# Patient Record
Sex: Female | Born: 1970
Health system: Southern US, Community
[De-identification: ages and names within clinical notes are randomized; demographics above are authoritative.]

## PROBLEM LIST (undated history)

## (undated) DIAGNOSIS — M109 Gout, unspecified: Secondary | ICD-10-CM

## (undated) DIAGNOSIS — G43909 Migraine, unspecified, not intractable, without status migrainosus: Secondary | ICD-10-CM

## (undated) DIAGNOSIS — I10 Essential (primary) hypertension: Secondary | ICD-10-CM

---

## 1998-03-21 ENCOUNTER — Emergency Department (HOSPITAL_COMMUNITY): Admission: EM | Admit: 1998-03-21 | Discharge: 1998-03-21 | Payer: Self-pay | Admitting: Emergency Medicine

## 1998-07-28 ENCOUNTER — Emergency Department (HOSPITAL_COMMUNITY): Admission: EM | Admit: 1998-07-28 | Discharge: 1998-07-28 | Payer: Self-pay | Admitting: Emergency Medicine

## 1998-07-28 ENCOUNTER — Encounter: Payer: Self-pay | Admitting: Emergency Medicine

## 1998-09-16 ENCOUNTER — Emergency Department (HOSPITAL_COMMUNITY): Admission: EM | Admit: 1998-09-16 | Discharge: 1998-09-16 | Payer: Self-pay | Admitting: Emergency Medicine

## 1999-02-09 ENCOUNTER — Emergency Department (HOSPITAL_COMMUNITY): Admission: EM | Admit: 1999-02-09 | Discharge: 1999-02-10 | Payer: Self-pay | Admitting: *Deleted

## 2000-11-14 ENCOUNTER — Encounter: Payer: Self-pay | Admitting: Emergency Medicine

## 2000-11-14 ENCOUNTER — Emergency Department (HOSPITAL_COMMUNITY): Admission: EM | Admit: 2000-11-14 | Discharge: 2000-11-15 | Payer: Self-pay | Admitting: Emergency Medicine

## 2001-04-16 ENCOUNTER — Emergency Department (HOSPITAL_COMMUNITY): Admission: EM | Admit: 2001-04-16 | Discharge: 2001-04-16 | Payer: Self-pay | Admitting: Emergency Medicine

## 2001-05-01 ENCOUNTER — Emergency Department (HOSPITAL_COMMUNITY): Admission: EM | Admit: 2001-05-01 | Discharge: 2001-05-01 | Payer: Self-pay | Admitting: Emergency Medicine

## 2001-12-05 ENCOUNTER — Emergency Department (HOSPITAL_COMMUNITY): Admission: EM | Admit: 2001-12-05 | Discharge: 2001-12-05 | Payer: Self-pay | Admitting: Emergency Medicine

## 2001-12-05 ENCOUNTER — Encounter: Payer: Self-pay | Admitting: Emergency Medicine

## 2001-12-19 ENCOUNTER — Encounter: Admission: RE | Admit: 2001-12-19 | Discharge: 2001-12-19 | Payer: Self-pay | Admitting: Obstetrics and Gynecology

## 2002-01-21 ENCOUNTER — Encounter (INDEPENDENT_AMBULATORY_CARE_PROVIDER_SITE_OTHER): Payer: Self-pay

## 2002-01-21 ENCOUNTER — Ambulatory Visit (HOSPITAL_COMMUNITY): Admission: RE | Admit: 2002-01-21 | Discharge: 2002-01-21 | Payer: Self-pay | Admitting: Obstetrics and Gynecology

## 2002-04-11 ENCOUNTER — Emergency Department (HOSPITAL_COMMUNITY): Admission: EM | Admit: 2002-04-11 | Discharge: 2002-04-11 | Payer: Self-pay | Admitting: Emergency Medicine

## 2002-04-11 ENCOUNTER — Encounter: Payer: Self-pay | Admitting: Emergency Medicine

## 2002-07-04 ENCOUNTER — Encounter: Admission: RE | Admit: 2002-07-04 | Discharge: 2002-07-04 | Payer: Self-pay | Admitting: *Deleted

## 2002-07-22 ENCOUNTER — Encounter: Admission: RE | Admit: 2002-07-22 | Discharge: 2002-07-22 | Payer: Self-pay | Admitting: *Deleted

## 2002-09-18 ENCOUNTER — Encounter: Admission: RE | Admit: 2002-09-18 | Discharge: 2002-09-18 | Payer: Self-pay | Admitting: Obstetrics and Gynecology

## 2002-10-01 ENCOUNTER — Emergency Department (HOSPITAL_COMMUNITY): Admission: EM | Admit: 2002-10-01 | Discharge: 2002-10-02 | Payer: Self-pay | Admitting: Emergency Medicine

## 2003-02-21 ENCOUNTER — Emergency Department (HOSPITAL_COMMUNITY): Admission: EM | Admit: 2003-02-21 | Discharge: 2003-02-21 | Payer: Self-pay

## 2003-05-05 ENCOUNTER — Emergency Department (HOSPITAL_COMMUNITY): Admission: EM | Admit: 2003-05-05 | Discharge: 2003-05-05 | Payer: Self-pay | Admitting: Emergency Medicine

## 2003-11-05 ENCOUNTER — Emergency Department (HOSPITAL_COMMUNITY): Admission: EM | Admit: 2003-11-05 | Discharge: 2003-11-06 | Payer: Self-pay | Admitting: Emergency Medicine

## 2003-12-05 ENCOUNTER — Inpatient Hospital Stay (HOSPITAL_COMMUNITY): Admission: AD | Admit: 2003-12-05 | Discharge: 2003-12-05 | Payer: Self-pay | Admitting: Obstetrics and Gynecology

## 2004-01-26 ENCOUNTER — Emergency Department (HOSPITAL_COMMUNITY): Admission: EM | Admit: 2004-01-26 | Discharge: 2004-01-26 | Payer: Self-pay | Admitting: Emergency Medicine

## 2004-05-26 ENCOUNTER — Emergency Department (HOSPITAL_COMMUNITY): Admission: EM | Admit: 2004-05-26 | Discharge: 2004-05-27 | Payer: Self-pay | Admitting: Emergency Medicine

## 2004-09-30 ENCOUNTER — Emergency Department (HOSPITAL_COMMUNITY): Admission: EM | Admit: 2004-09-30 | Discharge: 2004-09-30 | Payer: Self-pay | Admitting: Emergency Medicine

## 2005-03-22 ENCOUNTER — Emergency Department (HOSPITAL_COMMUNITY): Admission: EM | Admit: 2005-03-22 | Discharge: 2005-03-23 | Payer: Self-pay | Admitting: Emergency Medicine

## 2005-05-08 ENCOUNTER — Emergency Department (HOSPITAL_COMMUNITY): Admission: EM | Admit: 2005-05-08 | Discharge: 2005-05-08 | Payer: Self-pay | Admitting: *Deleted

## 2005-11-06 ENCOUNTER — Emergency Department (HOSPITAL_COMMUNITY): Admission: EM | Admit: 2005-11-06 | Discharge: 2005-11-07 | Payer: Self-pay | Admitting: Emergency Medicine

## 2005-11-23 ENCOUNTER — Emergency Department (HOSPITAL_COMMUNITY): Admission: EM | Admit: 2005-11-23 | Discharge: 2005-11-23 | Payer: Self-pay | Admitting: Emergency Medicine

## 2006-02-18 ENCOUNTER — Emergency Department (HOSPITAL_COMMUNITY): Admission: EM | Admit: 2006-02-18 | Discharge: 2006-02-18 | Payer: Self-pay | Admitting: Emergency Medicine

## 2006-02-21 ENCOUNTER — Emergency Department (HOSPITAL_COMMUNITY): Admission: EM | Admit: 2006-02-21 | Discharge: 2006-02-21 | Payer: Self-pay | Admitting: Family Medicine

## 2006-03-27 ENCOUNTER — Emergency Department (HOSPITAL_COMMUNITY): Admission: EM | Admit: 2006-03-27 | Discharge: 2006-03-27 | Payer: Self-pay | Admitting: Emergency Medicine

## 2006-04-12 ENCOUNTER — Ambulatory Visit: Payer: Self-pay | Admitting: Obstetrics and Gynecology

## 2006-05-29 HISTORY — PX: ABDOMINAL HYSTERECTOMY: SHX81

## 2006-07-02 ENCOUNTER — Ambulatory Visit: Payer: Self-pay | Admitting: Obstetrics and Gynecology

## 2006-07-02 ENCOUNTER — Encounter (INDEPENDENT_AMBULATORY_CARE_PROVIDER_SITE_OTHER): Payer: Self-pay | Admitting: *Deleted

## 2006-07-02 ENCOUNTER — Inpatient Hospital Stay (HOSPITAL_COMMUNITY): Admission: RE | Admit: 2006-07-02 | Discharge: 2006-07-04 | Payer: Self-pay | Admitting: Cardiology

## 2006-07-25 ENCOUNTER — Ambulatory Visit: Payer: Self-pay | Admitting: Obstetrics & Gynecology

## 2006-08-09 ENCOUNTER — Emergency Department (HOSPITAL_COMMUNITY): Admission: EM | Admit: 2006-08-09 | Discharge: 2006-08-09 | Payer: Self-pay | Admitting: Emergency Medicine

## 2006-09-19 ENCOUNTER — Emergency Department (HOSPITAL_COMMUNITY): Admission: EM | Admit: 2006-09-19 | Discharge: 2006-09-19 | Payer: Self-pay | Admitting: Emergency Medicine

## 2006-10-23 ENCOUNTER — Emergency Department (HOSPITAL_COMMUNITY): Admission: EM | Admit: 2006-10-23 | Discharge: 2006-10-23 | Payer: Self-pay | Admitting: Emergency Medicine

## 2007-03-20 ENCOUNTER — Ambulatory Visit (HOSPITAL_COMMUNITY): Admission: RE | Admit: 2007-03-20 | Discharge: 2007-03-20 | Payer: Self-pay | Admitting: Family Medicine

## 2007-08-16 ENCOUNTER — Emergency Department (HOSPITAL_COMMUNITY): Admission: EM | Admit: 2007-08-16 | Discharge: 2007-08-16 | Payer: Self-pay | Admitting: Emergency Medicine

## 2008-02-09 ENCOUNTER — Emergency Department (HOSPITAL_COMMUNITY): Admission: EM | Admit: 2008-02-09 | Discharge: 2008-02-09 | Payer: Self-pay | Admitting: Emergency Medicine

## 2008-04-11 ENCOUNTER — Emergency Department (HOSPITAL_COMMUNITY): Admission: EM | Admit: 2008-04-11 | Discharge: 2008-04-12 | Payer: Self-pay | Admitting: Emergency Medicine

## 2008-10-21 ENCOUNTER — Emergency Department (HOSPITAL_COMMUNITY): Admission: EM | Admit: 2008-10-21 | Discharge: 2008-10-21 | Payer: Self-pay | Admitting: Emergency Medicine

## 2009-03-23 ENCOUNTER — Emergency Department (HOSPITAL_COMMUNITY): Admission: EM | Admit: 2009-03-23 | Discharge: 2009-03-24 | Payer: Self-pay | Admitting: Emergency Medicine

## 2009-07-03 ENCOUNTER — Emergency Department (HOSPITAL_COMMUNITY): Admission: EM | Admit: 2009-07-03 | Discharge: 2009-07-03 | Payer: Self-pay | Admitting: Emergency Medicine

## 2009-09-26 ENCOUNTER — Emergency Department (HOSPITAL_COMMUNITY): Admission: EM | Admit: 2009-09-26 | Discharge: 2009-09-26 | Payer: Self-pay | Admitting: Emergency Medicine

## 2009-12-12 ENCOUNTER — Emergency Department (HOSPITAL_COMMUNITY): Admission: EM | Admit: 2009-12-12 | Discharge: 2009-12-13 | Payer: Self-pay | Admitting: Emergency Medicine

## 2010-03-08 ENCOUNTER — Emergency Department (HOSPITAL_COMMUNITY)
Admission: EM | Admit: 2010-03-08 | Discharge: 2010-03-08 | Payer: Self-pay | Source: Home / Self Care | Admitting: Emergency Medicine

## 2010-04-02 ENCOUNTER — Emergency Department (HOSPITAL_COMMUNITY): Admission: EM | Admit: 2010-04-02 | Discharge: 2010-04-02 | Payer: Self-pay | Admitting: Emergency Medicine

## 2010-06-30 ENCOUNTER — Emergency Department (HOSPITAL_COMMUNITY): Payer: Medicaid Other

## 2010-06-30 ENCOUNTER — Emergency Department (HOSPITAL_COMMUNITY)
Admission: EM | Admit: 2010-06-30 | Discharge: 2010-06-30 | Disposition: A | Discharge status: Home / Self Care | Payer: Medicaid Other | Attending: Emergency Medicine | Admitting: Emergency Medicine

## 2010-06-30 DIAGNOSIS — J029 Acute pharyngitis, unspecified: Secondary | ICD-10-CM | POA: Insufficient documentation

## 2010-06-30 DIAGNOSIS — R197 Diarrhea, unspecified: Secondary | ICD-10-CM | POA: Insufficient documentation

## 2010-06-30 DIAGNOSIS — J3489 Other specified disorders of nose and nasal sinuses: Secondary | ICD-10-CM | POA: Insufficient documentation

## 2010-06-30 DIAGNOSIS — R059 Cough, unspecified: Secondary | ICD-10-CM | POA: Insufficient documentation

## 2010-06-30 DIAGNOSIS — J069 Acute upper respiratory infection, unspecified: Secondary | ICD-10-CM | POA: Insufficient documentation

## 2010-06-30 DIAGNOSIS — R05 Cough: Secondary | ICD-10-CM | POA: Insufficient documentation

## 2010-06-30 LAB — POCT I-STAT, CHEM 8
BUN: 8 mg/dL (ref 6–23)
Calcium, Ion: 1.1 mmol/L — ABNORMAL LOW (ref 1.12–1.32)
Chloride: 108 mEq/L (ref 96–112)
Creatinine, Ser: 1 mg/dL (ref 0.4–1.2)
Glucose, Bld: 93 mg/dL (ref 70–99)
HCT: 42 % (ref 36.0–46.0)
Hemoglobin: 14.3 g/dL (ref 12.0–15.0)
Potassium: 4.3 mEq/L (ref 3.5–5.1)
Sodium: 137 mEq/L (ref 135–145)
TCO2: 23 mmol/L (ref 0–100)

## 2010-06-30 LAB — RAPID STREP SCREEN (MED CTR MEBANE ONLY): Streptococcus, Group A Screen (Direct): NEGATIVE

## 2010-08-01 ENCOUNTER — Emergency Department (HOSPITAL_COMMUNITY)
Admission: EM | Admit: 2010-08-01 | Discharge: 2010-08-02 | Disposition: A | Discharge status: Home / Self Care | Payer: Medicaid Other | Attending: Emergency Medicine | Admitting: Emergency Medicine

## 2010-08-01 DIAGNOSIS — G43909 Migraine, unspecified, not intractable, without status migrainosus: Secondary | ICD-10-CM | POA: Insufficient documentation

## 2010-08-01 DIAGNOSIS — H53149 Visual discomfort, unspecified: Secondary | ICD-10-CM | POA: Insufficient documentation

## 2010-08-01 DIAGNOSIS — Z862 Personal history of diseases of the blood and blood-forming organs and certain disorders involving the immune mechanism: Secondary | ICD-10-CM | POA: Insufficient documentation

## 2010-08-01 DIAGNOSIS — R112 Nausea with vomiting, unspecified: Secondary | ICD-10-CM | POA: Insufficient documentation

## 2010-08-01 DIAGNOSIS — Z8639 Personal history of other endocrine, nutritional and metabolic disease: Secondary | ICD-10-CM | POA: Insufficient documentation

## 2010-08-17 LAB — DIFFERENTIAL
Basophils Absolute: 0 10*3/uL (ref 0.0–0.1)
Basophils Relative: 0 % (ref 0–1)
Eosinophils Absolute: 0.1 10*3/uL (ref 0.0–0.7)
Eosinophils Relative: 1 % (ref 0–5)
Lymphocytes Relative: 7 % — ABNORMAL LOW (ref 12–46)
Lymphs Abs: 0.8 10*3/uL (ref 0.7–4.0)
Monocytes Absolute: 0.2 10*3/uL (ref 0.1–1.0)
Monocytes Relative: 2 % — ABNORMAL LOW (ref 3–12)
Neutrophils Relative %: 90 % — ABNORMAL HIGH (ref 43–77)

## 2010-08-17 LAB — URINALYSIS, ROUTINE W REFLEX MICROSCOPIC
Bilirubin Urine: NEGATIVE
Glucose, UA: NEGATIVE mg/dL
Hgb urine dipstick: NEGATIVE
Nitrite: NEGATIVE
Protein, ur: NEGATIVE mg/dL
Urobilinogen, UA: 0.2 mg/dL (ref 0.0–1.0)
pH: 5.5 (ref 5.0–8.0)

## 2010-08-17 LAB — COMPREHENSIVE METABOLIC PANEL
CO2: 22 mEq/L (ref 19–32)
Creatinine, Ser: 0.67 mg/dL (ref 0.4–1.2)
GFR calc Af Amer: >60 mL/min (ref >60)
Glucose, Bld: 87 mg/dL (ref 70–99)
Sodium: 135 mEq/L (ref 135–145)

## 2010-08-17 LAB — URINE MICROSCOPIC-ADD ON

## 2010-08-17 LAB — CBC
HCT: 39.6 % (ref 36.0–46.0)
MCHC: 33.7 g/dL (ref 30.0–36.0)
WBC: 10.9 10*3/uL — ABNORMAL HIGH (ref 4.0–10.5)

## 2010-08-17 LAB — LIPASE, BLOOD: Lipase: 23 U/L (ref 11–59)

## 2010-09-01 LAB — CBC
Hemoglobin: 12.7 g/dL (ref 12.0–15.0)
MCHC: 34.1 g/dL (ref 30.0–36.0)
Platelets: 331 10*3/uL (ref 150–400)
RBC: 4.26 MIL/uL (ref 3.87–5.11)
RDW: 12.8 % (ref 11.5–15.5)

## 2010-09-01 LAB — BASIC METABOLIC PANEL
BUN: 9 mg/dL (ref 6–23)
Chloride: 108 mEq/L (ref 96–112)
Creatinine, Ser: 0.66 mg/dL (ref 0.4–1.2)
GFR calc Af Amer: >60 mL/min (ref >60)
Glucose, Bld: 84 mg/dL (ref 70–99)

## 2010-09-01 LAB — URINALYSIS, ROUTINE W REFLEX MICROSCOPIC
Nitrite: NEGATIVE
Specific Gravity, Urine: 1.007 (ref 1.005–1.030)
Urobilinogen, UA: 0.2 mg/dL (ref 0.0–1.0)
pH: 6 (ref 5.0–8.0)

## 2010-09-01 LAB — URINE MICROSCOPIC-ADD ON

## 2010-09-01 LAB — DIFFERENTIAL
Basophils Absolute: 0 10*3/uL (ref 0.0–0.1)
Basophils Relative: 0 % (ref 0–1)
Lymphocytes Relative: 27 % (ref 12–46)
Monocytes Absolute: 0.9 10*3/uL (ref 0.1–1.0)
Neutro Abs: 6.6 10*3/uL (ref 1.7–7.7)
Neutrophils Relative %: 64 % (ref 43–77)

## 2010-10-14 NOTE — Discharge Summary (Signed)
NAMESANVI, EHLER           ACCOUNT NO.:  [id number]   MEDICAL RECORD NO.:  [id number]          PATIENT TYPE:  INP   LOCATION:  9316                          [facility]   PHYSICIAN:  Phil D. Okey Dupre, M.D.     DATE OF BIRTH:  [birth year]   DATE OF ADMISSION:  07/02/2006  DATE OF DISCHARGE:  07/04/2006                               DISCHARGE SUMMARY   The patient is a 40 year old multiparous African-American female who  underwent total vaginal hysterectomy because of severe dysmenorrhea and  menorrhagia on the day of admission and has done extremely well  postoperatively.  She was kept for an extra day because on admission her  reported hematocrit was 39.8 with a hemoglobin of 13.5.  On the day  after surgery, i.e., yesterday, she had a hemoglobin of 8.1 with a  hematocrit of 23.4.  Physical examination did not reveal any sign of  hematoma or any other reason for that tremendous drop when the patient,  we thought, lost 50 mL or less of blood at the time of surgery, so this  was repeated yesterday afternoon with a hematocrit of 22.5 and a  hemoglobin of 7.6.  Repeated hemoglobin this morning with a hemoglobin  of 8.1 and a hematocrit of 22.8.  therefore, I think that we have to  conclude that the admitting blood tests were a laboratory error as we  are positive that the estimated blood loss at surgery was not an  underestimate.  Other than that, on the physical exam, the lungs are  clear.  Heart:  No murmur, normal sinus rhythm.  The abdomen is soft,  flat, slightly tender as you would expect post hysterectomy, without  rebound, without guarding, and with normal bowel sounds.  The patient is  voiding, passing gas.  The extremities are negative with no Homans sign  and no edema.   The discharge instructions have been given in detail to the patient,  especially those related to heavy lifting and stairs as well as driving.  We have discussed follow-up with her and we will see her in  2 or 3 weeks  in the GYN clinic.  She has been given prescriptions for Slow Fe, which  I want her to stay on once a day for 3 months.  She has received a  prescription for Motrin 600 mg to take around the clock, and Percocet  p.r.n. for pain.  The number of those was Percocet 5 mg, #40; Motrin 600  mg, #30.   The discharge diagnosis per pathology has not yet been reported so at  this time is dysmenorrhea and menorrhagia.  The white count has been  normal, as have her platelets.  No other lab tests were done during her  postoperative period.           ______________________________  Javier Glazier Okey Dupre, M.D.     PDR/MEDQ  D:  07/04/2006  T:  07/04/2006  Job:  [id number]

## 2010-10-14 NOTE — Consult Note (Signed)
[facility]  Patient:    [name], [name]                    MRN: [id number] Proc. Date: 11/15/00 Adm. Date:  [id number] Attending:  Sandi Raveling CC:         Family Planning, [facility] Dept, [place], [place]   Consultation Report  CHIEF COMPLAINT:  Right lower quadrant pain, twice in the last 24 hours.  HISTORY OF PRESENT ILLNESS:  [age]-year-old woman, G4, P4-0-0-5, last menstrual period October 28, 2000, status post tubal ligation in 1997, who presented at 5:30 p.m. to the [facility] Emergency Room on November 14, 2000 for evaluation of right lower quadrant pain.  The patient noted right lower quadrant pain which began suddenly at 10 p.m. on the evening of November 13, 2000.  At that time, she was sitting on the floor eating her dinner.  The pain lasted less than two hours and went away after Advil.  She was able to sleep through the night.  Pain was better with hip flexion and did not radiate, was not accompanied by any GI symptoms.  The pain recurred in the same fashion while she was cleaning the refrigerator at 1 p.m. on November 14, 2000.  This time, the pain was "like labor," crampy and lasted 3 to 4 seconds.  The discomfort was ungulating and recurred every 5 to 10 minutes.  She presented to the hospital approximately 1730.  Last bowel movement was on the morning of November 14, 2000 and was normal.  She has had no nausea or vomiting.  She has eaten normally.  She was given medication at the time she was admitted to the emergency room and she has felt no further pain in the seven hours that she has been in the ER.  Her last period was normal and menses occur monthly, lasting four days and are moderately heavy during the first few days.  She reports her last Pap smear was five years ago and she has one partner.  She has had no urinary symptoms or back pain.  She reports no history of any sexually transmitted diseases in the past.  PAST  MEDICAL HISTORY:  Medical:  None.  Surgical:  Tubal ligation, 1997.  ALLERGIES:  None.  MEDICATIONS:  None.  OBSTETRICAL HISTORY:  Status post four vaginal deliveries, the last of which was in 1997.  FAMILY HISTORY:  Mother with hypertension.  No breast, ovarian or colon cancer.  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  Smokes a half a pack of cigarettes a day and drinks alcohol occasionally, whenever she has money. She reports sometimes drinking a six-pack at a time but usually has no more than six beers per week.  REVIEW OF SYSTEMS:  Otherwise normal -- see HPI.  PHYSICAL EXAMINATION:  GENERAL:  Agitated, healthy-appearing woman who is complaining about the wait and states that she feels well and is ready to go home.  Afebrile.  VITAL SIGNS:  Stable.  ABDOMEN:  Soft.  Normoactive bowel sounds.  Well-healed subumbilical incision. No hepatosplenomegaly, mass or hernia.  BACK:  Without CVAT.  GU:  External genitalia, BUS within normal limits.  Vagina with moderate creamy beige discharge.  Cervix without lesion or friability.  No cervical motion tenderness.  Uterus anteverted, top-normal size, nontender, mobile. Adnexa normal to palpation, nontender bilaterally.  Rectovaginal exam confirmatory.  Stool Hemoccult negative.  LABORATORY DATA:  Urine pregnancy negative.  Hemoglobin 12.9, WBC 10.2.  Normal comprehensive metabolic panel with the exception of a low alkaline phosphatase at 34 (normal 39 to 117).  Normal lipase.  Urinalysis:  Trace leukocyte esterase, no microscopic performed.  Wet prep:  No yeast, a few Trichomonas, no clue, many wbcs seen.  CT scan of the abdomen suggested a 2 x 3 adnexal area mass.  Ultrasound was obtained and revealed slight asymmetry of the right ovary which contains a few follicles and endometrial stripe of 2 cm.  Small amount of free pelvic fluid, which is felt likely to be physiologic, was seen.  IMPRESSION: 1. Right lower  quadrant pain -- upon further questioning, the patient remarked    that she had been moving heavy furniture.  The appearance of this    discomfort at a time when she was sitting on the floor and then cleaning    her refrigerator vigorously with her right side as well as its remittance    with Advil suggests musculoskeletal etiology.  There is no evidence by    history of a urinary source.  Small amount of leukocyte esterase could    easily be contamination by vaginal secretions which were significant.  No    evidence of gynecologic etiology for this discomfort.  Findings of    follicles in the right ovary are consistent with timing in the cycle.    There is no evidence of torsion or upper tract infection.  No    gastrointestinal history and stool is Hemoccult negative.  No evidence of    appendicitis because of normal eating and bowel habits and no leukocytosis. 2. Trichomonas vaginitis -- implications discussed with the patient regarding    sexually transmitted disease nature of Trichomonas.  Gonorrhea and    Chlamydia cultures sent.  Flagyl 500 mg b.i.d. x 7 days.  Strongly    cautioned against the use of alcohol during this time.  Importance of    treating the partner and abstinence until both partners are treated.    Questions answered. 3. Pyuria -- no symptoms to suggest urinary tract infection -- probable    contamination from vaginal secretions.  Urine culture and sensitivity sent. 4. Upper-ranges-of-normal thickness of endometrial stripe -- most likely    normal, given timing in cycle and lack of abnormal uterine bleeding    history.  PLAN:  The patient was asked to follow up with Family Planning and it was made clear that she is overdue for general GYN care including Pap smear and was given the telephone number for [facility], where she has been followed in the past.  Advised to use Advil for discomfort if pain recurs. DD:  11/15/00 TD:  11/15/00 Job: [id number]

## 2010-10-14 NOTE — Op Note (Signed)
[name], [name]                     ACCOUNT NO.:  [id number]   MEDICAL RECORD NO.:  [id number]                   PATIENT TYPE:  AMB   LOCATION:  SDC                                  [facility]   PHYSICIAN:  Clement Husbands, M.D.         DATE OF BIRTH:  1971/05/21   DATE OF PROCEDURE:  01/21/2002  DATE OF DISCHARGE:                                 OPERATIVE REPORT   PREOPERATIVE DIAGNOSES:  1. Right lower quadrant pelvic pain.  2. Status post tubal sterilization.   POSTOPERATIVE DIAGNOSES:  1. Right lower quadrant pelvic pain.  2. Status post tubal ligation.  3. Cul-de-sac adhesions on the right.  4. Broad adhesions holding the mid portion of the right fallopian tube to     the right pelvic sidewall.  5. Right abdominal wall direct hernia with fat in the hernia (not infarct).  6. Right ovarian cyst.   OPERATION:  1. Diagnostic laparoscopy.  2. Division of broad adhesions.  3. Exploration of hernia.  4. Aspiration of right ovarian cyst.   SURGEON:  Burnadette Peter, M.D.  Phil D. Okey Dupre, M.D.   ANESTHESIA:  General.   PROCEDURE:  With the patient under satisfactory general anesthesia in the  modified dorsal lithotomy position, the abdomen was prepped, bladder  catheterized, Hulka intrauterine-cervical tenaculum inserted and the abdomen  draped.  A small transverse skin incision was made in her old transverse  infraumbilical scar and a Veress needle introduced into the abdominal  cavity. CO2 was insufflated at the rate of a liter per minute to  approximately 2.5 liters yielding adequate abdominal distension.  Veress  needle was removed.  The atraumatic laparoscopic trocar was then introduced  and then the diagnostic laparoscope put in the 10-mm port.  Visualization  revealed normal placement.  Visualization revealed the uterus to be normal.  The left fallopian tube and ovary were normal and the tube showed evidence  of sterilization in its midportion.   In the right lateral pelvic cul-de-sac  was a band of vascularized adhesion.  On the right lateral pelvic sidewall  was a broad band of adhesion holding the midportion of the right fallopian  tube over towards the sidewall and the right anterior abdominal wall there  was a direct hernia whose right lateral edges could be identified. A little  bit of fat went into the hernia and the forceps could be pushed into the  hernia opening.   The cul-de-sac adhesion was coagulated and then sharply divided.  The hernia  area was then looked at in more detail. I did not remove the fat from the  opening which I estimate to about 1.4 cm in diameter.  As mentioned above,  the right edges of the somewhat circular opening were easily visualized.  The broad adhesions holding the right fallopian tube out towards the  sidewall were coagulated and divided which completely released the tube and  ovary.  The distal pole  of the right ovary contained a 2 cm serous-appearing  cyst.  A spinal needle was put through the abdominal wall into the cyst and  the cyst was ruptured and drained. Because there was slight bleeding in this  site, coagulated bipolar forceps were placed into the defective cyst and it  was coagulated. There was no further bleeding.  Remainder of the pelvis  appeared to be completely normal.   Excess CO2 gas was allowed to escape.  The trocar and scope were removed  under direct vision.  The rectus fascia was approximated with a 2-0 Vicryl  suture. Skin edges of the incision were closed with subcuticular 3-0 plain  catgut.  Abdominal wall was washed off.  A dry, sterile dressing was  applied.  Estimated blood loss approximately 10 cc. The patient tolerated  the procedure well and returned to recovery room in satisfactory condition.   Pictures were taken and are attached to her chart.  After she is followed up  postoperative for a few months if she still has the right lower quadrant  pain that is  almost point tenderness then she will need to be referred to  general surgery for repair of the hernia.  The contribution of the above  described __________ will only await time to see if their division helps.                                               Clement Husbands, M.D.    EFR/MEDQ  D:  01/21/2002  T:  01/21/2002  Job:  [id number]   cc:   Markham Jordan L. Effie Shy, M.D.

## 2010-10-14 NOTE — Op Note (Signed)
[name], [name]           ACCOUNT NO.:  [id number]   MEDICAL RECORD NO.:  [id number]          PATIENT TYPE:  AMB   LOCATION:  SDC                           [facility]   PHYSICIAN:  Phil D. Okey Dupre, M.D.     DATE OF BIRTH:  19-Jul-1970   DATE OF PROCEDURE:  07/02/2006  DATE OF DISCHARGE:                               OPERATIVE REPORT   PREOPERATIVE DIAGNOSES:  Menorrhagia, dysmenorrhea.   POSTOPERATIVE DIAGNOSES:  Menorrhagia, dysmenorrhea.   PROCEDURE:  Total vaginal hysterectomy.   SURGEON:  Javier Glazier. Okey Dupre, M.D.   ASSISTANT:  Shelbie Proctor. Shawnie Pons, M.D.   ANESTHESIA:  General and local.   SPECIMENS:  Uterus to pathology.   ESTIMATED BLOOD LOSS:  Was 50 mL.   COMPLICATIONS:  None.   INDICATIONS FOR PROCEDURE:  The patient is a 40 year old gravida 4, para  3, 1, 0, 5, who has had five previous vaginal deliveries, including a  set of twins, who had probable adenomyosis and severe disabling  dysmenorrhea and menorrhagia, who desired permanent treatment.   DESCRIPTION OF PROCEDURE:  The patient is taken to the operating room.  She is placed in the dorsal lithotomy position in [place] stirrups.  She  is prepped and draped in the usual sterile fashion.  The cervix was  grasped with two Lahey clamps and the cervix was injected with lidocaine  with epinephrine.  A circumferential incision was then made through the  vagina and the vagina was pushed off the cervix bluntly.  Posteriorly  the peritoneal cavity was entered sharply with Mayo scissors and the  posterior peritoneum tacked to the vaginal cuff.  A long weighted  speculum was then placed inside the abdomen.  Attention was __________  and anteriorly, although apparently it was never actually noted to be  opened.  The bladder was pushed up off the cervix and the uterus.  The  Heaney clamp was then used to clamp both uterosacral ligaments  bilaterally, which were cut and suture ligated with a Heaney-type  stitch.  These were  tagged.  The Gyrus was then used to sequentially  clamp the uterine arteries bilaterally, followed by the broad ligament,  up until the tubo-ovarian pedicles.  Each bite was taken.  Coagulation  applied.  A more medial bite was taken.  Coagulation applied and then  each bite cut with the Mayo scissors.  The tubo-ovarian pedicles were  then identified.  The uterus was slipped posteriorly using towel clamps  to bring the fundus of the uterus up and out.  At that point it was  noted that the anterior peritoneal cavity had been entered and a free  tie was placed at the tubo-ovarian pedicle.  A Heaney clamp was then  placed in front of this and the tubo-ovarian pedicle cut.  A suture was  then placed on the tubo-ovarian pedicle bilaterally.  The tubo-ovarian  pedicles and the uterosacral ligaments were inspected on both sides and  hemostasis was felt to be adequate.  A long suture was then used to  close the vaginal cuff horizontally in a locked running fashion,  including the uterosacral  ligaments at both ends.  Hemostasis was  excellent.  All instruments were removed from the vagina.  The Foley  catheter was placed.  Clear yellow urine was noted.  The vaginal packing  was performed.  All instrument, needle and lap counts were correct x2.   The patient was awakened and taken to the recovery room in stable  condition.     ______________________________  Shelbie Proctor Shawnie Pons, M.D.    ______________________________  Javier Glazier. Okey Dupre, M.D.    TSP/MEDQ  D:  07/02/2006  T:  07/02/2006  Job:  [id number]

## 2010-10-14 NOTE — Group Therapy Note (Signed)
[name], [name]           ACCOUNT NO.:  [id number]   MEDICAL RECORD NO.:  [id number]          PATIENT TYPE:  WOC   LOCATION:  [facility]                   [facility]AC[facility]   PHYSICIAN:  Dorthula Perfect, MD     DATE OF BIRTH:  December 25, 1970   DATE OF SERVICE:  07/25/2006                                  CLINIC NOTE   This 40 year old white female returns today for postoperative check.  She underwent a vaginal hysterectomy by Dr. Okey Dupre on July 02, 2006.  She was in the hospital for three to four days afterwards.  She has done  well at home with the exception of abdominal pain and cramping, mainly  before the need to have a bowel movement.  She has bowel movements about  every other day.  The stool is hard.  Afterwards the cramping pain goes  away.  She states she is drinking at least three 16 ounce containers of  water a day.  She does not like prune juice.  She currently is not  taking her prescribed iron because she was told that might make it more  difficult to have a bowel movement.   PHYSICAL EXAMINATION:  VITAL SIGNS:  Blood pressure 115/68, weight 122.  ABDOMEN:  Flat.  Is not distended.  She has abdominal discomfort below  the umbilicus and more in the central portion of her abdomen.  No masses  are felt.  VAGINAL:  The vaginal cuff feels like it is healing.  I did not  visualize it.  There are no pelvic masses noted.   IMPRESSION:  1. Status postoperative exam, three weeks.  2. Constipation.   DISPOSITION:  1. Increase fluids more.  2. Increase fiber.  3. Ibuprofen or naproxen.  4. For the next two days be on a liquid diet and then return to eating      food.  5. Apple juice.  6. Return in two weeks.           ______________________________  Dorthula Perfect, MD     ER/MEDQ  D:  07/25/2006  T:  07/25/2006  Job:  [id number]

## 2010-10-14 NOTE — Group Therapy Note (Signed)
[name], [name]           ACCOUNT NO.:  [id number]   MEDICAL RECORD NO.:  [id number]          PATIENT TYPE:  WOC   LOCATION:  [facility]                   [facility]ACILITY[facility]   PHYSICIAN:  Argentina Donovan, MD        DATE OF BIRTH:  [birth year]   DATE OF SERVICE:  04/12/2006                                    CLINIC NOTE   The patient is a 40 year old African American female gravida 4, para 3-1-0-5  with five vaginal deliveries and no has cesarean section.  Her only surgery  has been a laparoscopic aspiration of an ovarian cyst and a postpartum tubal  ligation.  Her last pregnancy was 10 years ago and since that time the  periods of gotten heavier and heavier and more and more painful.  An  ultrasound revealed some tiny leiomyomata but the most likely diagnosis in  this patient is adenomyosis.  She is in good health with no medical  problems, works in a [employer] and finds work difficult and she  is having a period.  She is a single mother so it is important that she  continues working.  She has been trying to get a hysterectomy for many years  but has been unable to because of cost.  The patient I feel needs a vaginal  hysterectomy.   PHYSICAL EXAMINATION:  External genitalia is normal.  We will is within  normal limits.  Vagina is clean and well rugated.  Cervix clean and parous.  Pap smear was done a short-time ago by an internist.  The uterus is anterior  of normal size and shape and somewhat soft in consistency.  Adnexa is  normal.  Cul-de-sac is free   IMPRESSION:  Severe disabling dysmenorrhea with menorrhagia, probable  adenomyosis.   PLAN:  Schedule for a vaginal hysterectomy and see her one week before.           ______________________________  Argentina Donovan, MD     PR/MEDQ  D:  04/12/2006  T:  04/12/2006  Job:  [id number]

## 2010-10-14 NOTE — H&P (Signed)
[name], [name]            ACCOUNT NO.:  [id number]   MEDICAL RECORD NO.:  [id number]           PATIENT TYPE:  AMB   LOCATION:                                [facility]   PHYSICIAN:  Phil D. Okey Dupre, M.D.          DATE OF BIRTH:   DATE OF ADMISSION:  07/02/2006  DATE OF DISCHARGE:                              HISTORY & PHYSICAL   CHIEF COMPLAINT:  Severe, disabling, painful periods that are extremely  heavy.   PRESENT ILLNESS:  The patient is a 40 year old African-American female  gravida 4, para 3-1-0-5 with 5 vaginal deliveries.  Her last delivery  was twins and has had no cesarean sections.  The only surgery was a  laparoscopic aspiration of an ovarian cyst and a postpartum tubal  ligation.  Her last pregnancy was 11 years ago; and since that time her  periods have gotten heavier and heavier and more and more painful.  An  ultrasound revealed some tiny leiomyomata but most likely diagnosed  since the patient has adenomyosis.  She is in good health.  There are no  medical problems, works in a [employer] and finds work  impossible when she is having a period because she has a difficult time  standing.  She is a single mother so it is important for her to continue  working.  She has been attempting to have a hysterectomy for many years,  but has really been unable to do it because of cost.  The patient is  being admitted for vaginal hysterectomy.   PAST MEDICAL HISTORY:  No significant medical history.   SURGICAL HISTORY:  Tubal ligation and laparoscopic ovarian cyst  aspiration.   ALLERGIES:  The patient has none.   MEDICATIONS:  She takes none except ibuprofen during the tie of her  periods.   FAMILY HISTORY:  A mother with hypertension.  No breast, ovarian, or  colon cancer in the family.   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  Drinks alcohol occasionally.   REVIEW OF SYSTEMS:  Negative with  the exception of the present illness.   PHYSICAL EXAMINATION:  VITAL SIGNS:  Blood pressure 131/76, pulse 86,  temperature 98.2, respirations 14 per minute, weight 134 pounds.  Height  5 feet 6 inches tall.  GENERAL:  A well-developed, well-nourished, slender, black female in no  acute distress.  HEENT:  PERLA.  Within normal limits.  NECK:  Supple.  Thyroid symmetrical with no mass.  BACK:  Erect.  LUNGS:  Clear to auscultation and percussion.  HEART:  No murmur.  Normal sinus rhythm.  PMI in the fifth IS and MCL.  ABDOMEN:  Soft and flat, nontender.  No masses nor organomegaly.  No CVA  tenderness.  EXTREMITIES:  No edema, no varices.  NEUROLOGIC:  DTRs within normal limits.  GENITALIA:  External genitalia is normal.  BUS within normal limits.  Introitus is marital.  Vagina is clean and well rugated.  The cervix is  clean and parous.  The uterus is anterior and of normal size, shape, and  consistency.  Adnexa is normal.  RECTAL:  No dominant masses.   IMPRESSION:  Severe disabling dysmenorrhea and menorrhagia, probable  adenomyosis.   PLAN:  Vaginal hysterectomy.  The patient and her partner were consulted  today.  We discussed the procedure in detail.  We talked about possible  complications, especially those involving anesthesia, injury to bladder,  and digestive tract.  We talked about postoperative hemorrhage and  infection.  We discussed the patient's work and how long it would be  before she would be able to go back.  Postoperative activity especially  that related to lifting and stairs, and we discussed postoperative diet.  We have told the patient to avoid eating or drinking anything after  midnight on the day __________ surgery, to try and stop her smoking  until surgery was over; and the patient nor her partner had any  questions at the end of our consultation.           ______________________________  Javier Glazier Okey Dupre, M.D.     PDR/MEDQ  D:  06/26/2006  T:   06/26/2006  Job:  [id number]

## 2010-12-10 ENCOUNTER — Emergency Department (HOSPITAL_COMMUNITY)
Admission: EM | Admit: 2010-12-10 | Discharge: 2010-12-10 | Disposition: A | Discharge status: Home / Self Care | Payer: Medicaid Other | Attending: Emergency Medicine | Admitting: Emergency Medicine

## 2010-12-10 DIAGNOSIS — G43909 Migraine, unspecified, not intractable, without status migrainosus: Secondary | ICD-10-CM | POA: Insufficient documentation

## 2011-02-20 LAB — GC/CHLAMYDIA PROBE AMP, GENITAL
Chlamydia, DNA Probe: NEGATIVE
GC Probe Amp, Genital: NEGATIVE

## 2011-02-20 LAB — URINALYSIS, ROUTINE W REFLEX MICROSCOPIC
Bilirubin Urine: NEGATIVE
Glucose, UA: NEGATIVE
Hgb urine dipstick: NEGATIVE
Protein, ur: NEGATIVE
Urobilinogen, UA: 0.2

## 2011-02-20 LAB — WET PREP, GENITAL
Clue Cells Wet Prep HPF POC: NONE SEEN
Trich, Wet Prep: NONE SEEN
Yeast Wet Prep HPF POC: NONE SEEN

## 2011-02-28 LAB — D-DIMER, QUANTITATIVE: D-Dimer, Quant: 0.24

## 2011-03-01 LAB — POCT I-STAT, CHEM 8
BUN: 6
Calcium, Ion: 1.09 — ABNORMAL LOW
Creatinine, Ser: 0.9
Hemoglobin: 14.3
Sodium: 139
TCO2: 22

## 2013-03-26 ENCOUNTER — Emergency Department (HOSPITAL_COMMUNITY)
Admission: EM | Admit: 2013-03-26 | Discharge: 2013-03-26 | Disposition: A | Discharge status: Home / Self Care | Payer: Medicaid Other | Source: Home / Self Care | Attending: Emergency Medicine | Admitting: Emergency Medicine

## 2013-03-26 ENCOUNTER — Encounter (HOSPITAL_COMMUNITY): Payer: Self-pay | Admitting: Emergency Medicine

## 2013-03-26 DIAGNOSIS — G43909 Migraine, unspecified, not intractable, without status migrainosus: Secondary | ICD-10-CM

## 2013-03-26 HISTORY — DX: Migraine, unspecified, not intractable, without status migrainosus: G43.909

## 2013-03-26 MED ORDER — SUMATRIPTAN SUCCINATE 6 MG/0.5ML ~~LOC~~ SOLN
6.0000 mg | Freq: Once | SUBCUTANEOUS | Status: AC
  Administered 2013-03-26: 6 mg via SUBCUTANEOUS

## 2013-03-26 MED ORDER — METOCLOPRAMIDE HCL 5 MG/ML IJ SOLN
10.0000 mg | Freq: Once | INTRAMUSCULAR | Status: AC
  Administered 2013-03-26: 10 mg via INTRAMUSCULAR

## 2013-03-26 MED ORDER — METOCLOPRAMIDE HCL 5 MG/ML IJ SOLN
INTRAMUSCULAR | Status: AC
  Filled 2013-03-26: qty 2

## 2013-03-26 MED ORDER — KETOROLAC TROMETHAMINE 30 MG/ML IJ SOLN
INTRAMUSCULAR | Status: AC
  Filled 2013-03-26: qty 1

## 2013-03-26 MED ORDER — KETOROLAC TROMETHAMINE 60 MG/2ML IM SOLN
30.0000 mg | Freq: Once | INTRAMUSCULAR | Status: AC
  Administered 2013-03-26: 30 mg via INTRAMUSCULAR

## 2013-03-26 MED ORDER — SUMATRIPTAN SUCCINATE 50 MG PO TABS: 50.0000 mg | ORAL_TABLET | ORAL | Status: DC | PRN

## 2013-03-26 MED ORDER — SUMATRIPTAN SUCCINATE 6 MG/0.5ML ~~LOC~~ SOLN
SUBCUTANEOUS | Status: AC
  Filled 2013-03-26: qty 0.5

## 2013-03-26 NOTE — ED Notes (Signed)
Provided water

## 2013-03-26 NOTE — ED Notes (Signed)
Post injection delay prior to discharge

## 2013-03-26 NOTE — ED Provider Notes (Signed)
Chief Complaint:   Chief Complaint  Patient presents with  . Headache    History of Present Illness:   [name] is a 42 year old female who has had a three-day history of a migraine headache. There was no specific precipitating factor. The patient describes a bilateral, throbbing headache with radiation to her eyes rated 9/10 in intensity. This has been associated with nausea but no vomiting, dizziness, photophobia, and phonophobia. She has no visual symptoms or aura. She denies any paresthesias, weakness, difficulty with speech or ambulation. No fever, chills, or stiff neck. The patient has a history of migraines for about 18 years. She's not taking any medication for right now. She gets a migraine about every 3 months.  Review of Systems:  Other than noted above, the patient denies any of the following symptoms: Systemic:  No fever, chills, fatigue, photophobia, stiff neck. Eye:  No redness, eye pain, discharge, blurred vision, or diplopia. ENT:  No nasal congestion, rhinorrhea, sinus pressure or pain, sneezing, earache, or sore throat.  No jaw claudication. Neuro:  No paresthesias, loss of consciousness, seizure activity, muscle weakness, trouble with coordination or gait, trouble speaking or swallowing. Psych:  No depression, anxiety or trouble sleeping.  PMFSH:  Past medical history, family history, social history, meds, and allergies were reviewed.    Physical Exam:   Vital signs:  BP 137/86  Pulse 88  Temp(Src) 98.2 F (36.8 C) (Oral)  Resp 20  SpO2 97% General:  Alert and oriented.  In no distress. Eye:  Lids and conjunctivas normal.  PERRL,  Full EOMs.  Fundi benign with normal discs and vessels. ENT:  No cranial or facial tenderness to palpation.  TMs and canals clear.  Nasal mucosa was normal and uncongested without any drainage. No intra oral lesions, pharynx clear, mucous membranes moist, dentition normal. Neck:  Supple, full ROM, no tenderness to palpation.  No  adenopathy or mass. Neuro:  Alert and orented times 3.  Speech was clear, fluent, and appropriate.  Cranial nerves intact. No pronator drift, muscle strength normal. Finger to nose normal.  DTRs were 2+ and symmetrical.Station and gait were normal.  Romberg's sign was normal.  Able to perform tandem gait well. Psych:  Normal affect.  Course in Urgent Care Center:   Given Toradol 30 mg IM, Reglan 10 mg IM, and Imitrex 6 mg IM.  Assessment:  The encounter diagnosis was Migraine headache.  Plan:   1.  Meds:  The following meds were prescribed:   New Prescriptions   SUMATRIPTAN (IMITREX) 50 MG TABLET    Take 1 tablet (50 mg total) by mouth every 2 (two) hours as needed for migraine. May repeat in 2 hours if headache persists or recurs.    2.  Patient Education/Counseling:  The patient was given appropriate handouts, self care instructions, and instructed in symptomatic relief.    3.  Follow up:  The patient was told to follow up if no better in 3 to 4 days, if becoming worse in any way, and given some red flag symptoms such as persistent headache, fever, or any new neurological symptoms which would prompt immediate return.  Follow up with her primary care physician as needed.     Reuben Likes, MD 03/26/13 1010

## 2013-03-26 NOTE — ED Notes (Signed)
After receiving injections, patient c/o nose tingling, denies any swelling or difficulty breathing.  Dr Lorenz Coaster notified.  Patient requesting to leave and cleared by dr Lorenz Coaster to do so.

## 2013-03-26 NOTE — ED Notes (Signed)
Patient reports " migraine"  Onset Monday of this headache.  Reports history of the same.  Pain in forehead, sensitive to noise, sensitive to light.

## 2013-09-22 ENCOUNTER — Encounter (HOSPITAL_COMMUNITY): Payer: Self-pay | Admitting: Emergency Medicine

## 2013-09-22 DIAGNOSIS — H538 Other visual disturbances: Secondary | ICD-10-CM | POA: Insufficient documentation

## 2013-09-22 DIAGNOSIS — F17200 Nicotine dependence, unspecified, uncomplicated: Secondary | ICD-10-CM | POA: Insufficient documentation

## 2013-09-22 DIAGNOSIS — R51 Headache: Secondary | ICD-10-CM | POA: Insufficient documentation

## 2013-09-22 DIAGNOSIS — R42 Dizziness and giddiness: Secondary | ICD-10-CM | POA: Insufficient documentation

## 2013-09-22 NOTE — ED Notes (Signed)
Pt reports "entire head" HA x 1 hour progressively worsening with blurred vision and dizziness. Pt ambulatory to triage, neuro intact. Denies taking anything for pain. PERRLA, 3mm.

## 2013-09-23 ENCOUNTER — Emergency Department (HOSPITAL_COMMUNITY)
Admission: EM | Admit: 2013-09-23 | Discharge: 2013-09-23 | Discharge status: Against Medical Advice | Payer: Medicaid Other | Attending: Emergency Medicine | Admitting: Emergency Medicine

## 2013-09-23 NOTE — ED Notes (Signed)
Pt called for room.  No answer x2  

## 2013-09-23 NOTE — ED Notes (Signed)
Pt called for room. No answer x 3 

## 2013-09-23 NOTE — ED Notes (Signed)
Pt called back to reassess VS with no answer x 1

## 2014-01-09 ENCOUNTER — Encounter (HOSPITAL_COMMUNITY): Payer: Self-pay | Admitting: Emergency Medicine

## 2014-01-09 ENCOUNTER — Emergency Department (HOSPITAL_COMMUNITY)
Admission: EM | Admit: 2014-01-09 | Discharge: 2014-01-09 | Disposition: A | Discharge status: Home / Self Care | Payer: Medicaid Other | Source: Home / Self Care | Attending: Family Medicine | Admitting: Family Medicine

## 2014-01-09 DIAGNOSIS — M10061 Idiopathic gout, right knee: Secondary | ICD-10-CM

## 2014-01-09 DIAGNOSIS — M109 Gout, unspecified: Secondary | ICD-10-CM

## 2014-01-09 HISTORY — DX: Gout, unspecified: M10.9

## 2014-01-09 MED ORDER — COLCHICINE 0.6 MG PO TABS: 0.6000 mg | ORAL_TABLET | Freq: Every day | ORAL | Status: DC

## 2014-01-09 MED ORDER — TRAMADOL HCL 50 MG PO TABS: 50.0000 mg | ORAL_TABLET | Freq: Four times a day (QID) | ORAL | Status: DC | PRN

## 2014-01-09 NOTE — ED Provider Notes (Signed)
Katherine PerchesShawanna D Terry is a 43 y.o. female who presents to Urgent Care today for right knee pain and swelling. Symptoms present for one week. Symptoms are moderate and worse with activity. She has tried hot water soaks Aleve and Tylenol as well as a brace which have not helped. She has a history of gout involving her left great toe. The pain is somewhat consistent with previous episodes of gout. No injury fevers or chills nausea vomiting or diarrhea.   Past Medical History  Diagnosis Date  . Migraines   . Gout    History  Substance Use Topics  . Smoking status: Current Every Day Smoker -- 0.10 packs/day    Types: Cigarettes  . Smokeless tobacco: Not on file  . Alcohol Use: Yes     Comment: occasional   ROS as above Medications: No current facility-administered medications for this encounter.   Current Outpatient Prescriptions  Medication Sig Dispense Refill  . amitriptyline (ELAVIL) 25 MG tablet Take 25 mg by mouth at bedtime.      . lovastatin (MEVACOR) 10 MG tablet Take 10 mg by mouth at bedtime.      . Naproxen Sodium (ALEVE PO) Take 440 mg by mouth.       . colchicine 0.6 MG tablet Take 1 tablet (0.6 mg total) by mouth daily.  30 tablet  0  . traMADol (ULTRAM) 50 MG tablet Take 1 tablet (50 mg total) by mouth every 6 (six) hours as needed.  15 tablet  0  . [DISCONTINUED] Ibuprofen-Diphenhydramine Cit (ADVIL PM PO) Take by mouth.      . [DISCONTINUED] SUMAtriptan (IMITREX) 50 MG tablet Take 1 tablet (50 mg total) by mouth every 2 (two) hours as needed for migraine. May repeat in 2 hours if headache persists or recurs.  10 tablet  0    Exam:  BP 144/103  Pulse 96  Temp(Src) 97.8 F (36.6 C) (Oral)  Resp 17  SpO2 96% Gen: Well NAD Right knee: Moderate effusion. Tender. Range of motion 5-100 Stable ligamentous exam Capillary refill sensation and pulses intact distally   No results found for this or any previous visit (from the past 24 hour(s)). No results  found.  Assessment and Plan: 43 y.o. female with right knee pain. Likely gout flare. Plan to treat with colchicine and tramadol. Patient declined aspiration and injection. If not improved would recommend aspiration and injection of corticosteroids.  Discussed warning signs or symptoms. Please see discharge instructions. Patient expresses understanding.   This note was created using Conservation officer, historic buildingsDragon voice recognition software. Any transcription errors are unintended.    Rodolph BongEvan S Patrici Minnis, MD 01/09/14 [phone]

## 2014-01-09 NOTE — ED Notes (Signed)
C/o pain and swelling R knee onset 1 week ago.  Hx. Gout.  No known injury.

## 2014-01-09 NOTE — Discharge Instructions (Signed)
Thank you for coming in today. Take colchicine daily.  Use tramadol for pain as needed.    Gout Gout is an inflammatory arthritis caused by a buildup of uric acid crystals in the joints. Uric acid is a chemical that is normally present in the blood. When the level of uric acid in the blood is too high it can form crystals that deposit in your joints and tissues. This causes joint redness, soreness, and swelling (inflammation). Repeat attacks are common. Over time, uric acid crystals can form into masses (tophi) near a joint, destroying bone and causing disfigurement. Gout is treatable and often preventable. CAUSES  The disease begins with elevated levels of uric acid in the blood. Uric acid is produced by your body when it breaks down a naturally found substance called purines. Certain foods you eat, such as meats and fish, contain high amounts of purines. Causes of an elevated uric acid level include:  Being passed down from parent to child (heredity).  Diseases that cause increased uric acid production (such as obesity, psoriasis, and certain cancers).  Excessive alcohol use.  Diet, especially diets rich in meat and seafood.  Medicines, including certain cancer-fighting medicines (chemotherapy), water pills (diuretics), and aspirin.  Chronic kidney disease. The kidneys are no longer able to remove uric acid well.  Problems with metabolism. Conditions strongly associated with gout include:  Obesity.  High blood pressure.  High cholesterol.  Diabetes. Not everyone with elevated uric acid levels gets gout. It is not understood why some people get gout and others do not. Surgery, joint injury, and eating too much of certain foods are some of the factors that can lead to gout attacks. SYMPTOMS   An attack of gout comes on quickly. It causes intense pain with redness, swelling, and warmth in a joint.  Fever can occur.  Often, only one joint is involved. Certain joints are more  commonly involved:  Base of the big toe.  Knee.  Ankle.  Wrist.  Finger. Without treatment, an attack usually goes away in a few days to weeks. Between attacks, you usually will not have symptoms, which is different from many other forms of arthritis. DIAGNOSIS  Your caregiver will suspect gout based on your symptoms and exam. In some cases, tests may be recommended. The tests may include:  Blood tests.  Urine tests.  X-rays.  Joint fluid exam. This exam requires a needle to remove fluid from the joint (arthrocentesis). Using a microscope, gout is confirmed when uric acid crystals are seen in the joint fluid. TREATMENT  There are two phases to gout treatment: treating the sudden onset (acute) attack and preventing attacks (prophylaxis).  Treatment of an Acute Attack.  Medicines are used. These include anti-inflammatory medicines or steroid medicines.  An injection of steroid medicine into the affected joint is sometimes necessary.  The painful joint is rested. Movement can worsen the arthritis.  You may use warm or cold treatments on painful joints, depending which works best for you.  Treatment to Prevent Attacks.  If you suffer from frequent gout attacks, your caregiver may advise preventive medicine. These medicines are started after the acute attack subsides. These medicines either help your kidneys eliminate uric acid from your body or decrease your uric acid production. You may need to stay on these medicines for a very long time.  The early phase of treatment with preventive medicine can be associated with an increase in acute gout attacks. For this reason, during the first few months of  treatment, your caregiver may also advise you to take medicines usually used for acute gout treatment. Be sure you understand your caregiver's directions. Your caregiver may make several adjustments to your medicine dose before these medicines are effective.  Discuss dietary treatment  with your caregiver or dietitian. Alcohol and drinks high in sugar and fructose and foods such as meat, poultry, and seafood can increase uric acid levels. Your caregiver or dietitian can advise you on drinks and foods that should be limited. HOME CARE INSTRUCTIONS   Do not take aspirin to relieve pain. This raises uric acid levels.  Only take over-the-counter or prescription medicines for pain, discomfort, or fever as directed by your caregiver.  Rest the joint as much as possible. When in bed, keep sheets and blankets off painful areas.  Keep the affected joint raised (elevated).  Apply warm or cold treatments to painful joints. Use of warm or cold treatments depends on which works best for you.  Use crutches if the painful joint is in your leg.  Drink enough fluids to keep your urine clear or pale yellow. This helps your body get rid of uric acid. Limit alcohol, sugary drinks, and fructose drinks.  Follow your dietary instructions. Pay careful attention to the amount of protein you eat. Your daily diet should emphasize fruits, vegetables, whole grains, and fat-free or low-fat milk products. Discuss the use of coffee, vitamin C, and cherries with your caregiver or dietitian. These may be helpful in lowering uric acid levels.  Maintain a healthy body weight. SEEK MEDICAL CARE IF:   You develop diarrhea, vomiting, or any side effects from medicines.  You do not feel better in 24 hours, or you are getting worse. SEEK IMMEDIATE MEDICAL CARE IF:   Your joint becomes suddenly more tender, and you have chills or a fever. MAKE SURE YOU:   Understand these instructions.  Will watch your condition.  Will get help right away if you are not doing well or get worse. Document Released: 05/12/2000 Document Revised: 09/29/2013 Document Reviewed: 12/27/2011 [facility] Patient Information 2015 [place]. This information is not intended to replace advice given to you by your health care  provider. Make sure you discuss any questions you have with your health care provider.

## 2014-07-14 ENCOUNTER — Emergency Department (HOSPITAL_COMMUNITY)
Admission: EM | Admit: 2014-07-14 | Discharge: 2014-07-14 | Disposition: A | Discharge status: Home / Self Care | Payer: 59 | Attending: Emergency Medicine | Admitting: Emergency Medicine

## 2014-07-14 ENCOUNTER — Encounter (HOSPITAL_COMMUNITY): Payer: Self-pay | Admitting: *Deleted

## 2014-07-14 DIAGNOSIS — M109 Gout, unspecified: Secondary | ICD-10-CM | POA: Insufficient documentation

## 2014-07-14 DIAGNOSIS — I10 Essential (primary) hypertension: Secondary | ICD-10-CM | POA: Diagnosis not present

## 2014-07-14 DIAGNOSIS — R0789 Other chest pain: Secondary | ICD-10-CM | POA: Insufficient documentation

## 2014-07-14 DIAGNOSIS — G43909 Migraine, unspecified, not intractable, without status migrainosus: Secondary | ICD-10-CM | POA: Insufficient documentation

## 2014-07-14 DIAGNOSIS — Z720 Tobacco use: Secondary | ICD-10-CM | POA: Diagnosis not present

## 2014-07-14 DIAGNOSIS — Z79899 Other long term (current) drug therapy: Secondary | ICD-10-CM | POA: Insufficient documentation

## 2014-07-14 DIAGNOSIS — R079 Chest pain, unspecified: Secondary | ICD-10-CM | POA: Diagnosis present

## 2014-07-14 HISTORY — DX: Essential (primary) hypertension: I10

## 2014-07-14 LAB — LIPASE, BLOOD: Lipase: 36 U/L (ref 11–59)

## 2014-07-14 LAB — CBC WITH DIFFERENTIAL/PLATELET
BASOS ABS: 0 10*3/uL (ref 0.0–0.1)
BASOS PCT: 0 % (ref 0–1)
EOS ABS: 0.1 10*3/uL (ref 0.0–0.7)
EOS PCT: 0 % (ref 0–5)
HEMATOCRIT: 40.4 % (ref 36.0–46.0)
Hemoglobin: 13.3 g/dL (ref 12.0–15.0)
LYMPHS PCT: 39 % (ref 12–46)
Lymphs Abs: 5.3 10*3/uL — ABNORMAL HIGH (ref 0.7–4.0)
MCH: 28.7 pg (ref 26.0–34.0)
MCHC: 32.9 g/dL (ref 30.0–36.0)
MCV: 87.1 fL (ref 78.0–100.0)
MONO ABS: 1.1 10*3/uL — AB (ref 0.1–1.0)
Monocytes Relative: 8 % (ref 3–12)
Neutro Abs: 7.2 10*3/uL (ref 1.7–7.7)
Neutrophils Relative %: 53 % (ref 43–77)
PLATELETS: 415 10*3/uL — AB (ref 150–400)
RBC: 4.64 MIL/uL (ref 3.87–5.11)
RDW: 12.9 % (ref 11.5–15.5)
WBC: 13.6 10*3/uL — ABNORMAL HIGH (ref 4.0–10.5)

## 2014-07-14 LAB — COMPREHENSIVE METABOLIC PANEL
ALT: 22 U/L (ref 0–35)
ANION GAP: 9 (ref 5–15)
AST: 22 U/L (ref 0–37)
Albumin: 3.5 g/dL (ref 3.5–5.2)
Alkaline Phosphatase: 46 U/L (ref 39–117)
BUN: 13 mg/dL (ref 6–23)
CALCIUM: 8.6 mg/dL (ref 8.4–10.5)
CO2: 22 mmol/L (ref 19–32)
CREATININE: 0.79 mg/dL (ref 0.50–1.10)
Chloride: 108 mmol/L (ref 96–112)
GLUCOSE: 110 mg/dL — AB (ref 70–99)
Potassium: 3.5 mmol/L (ref 3.5–5.1)
Sodium: 139 mmol/L (ref 135–145)
TOTAL PROTEIN: 6.6 g/dL (ref 6.0–8.3)
Total Bilirubin: 0.7 mg/dL (ref 0.3–1.2)

## 2014-07-14 LAB — TROPONIN I

## 2014-07-14 MED ORDER — OMEPRAZOLE 20 MG PO CPDR: 20.0000 mg | DELAYED_RELEASE_CAPSULE | Freq: Two times a day (BID) | ORAL | Status: DC

## 2014-07-14 MED ORDER — GI COCKTAIL ~~LOC~~
30.0000 mL | Freq: Once | ORAL | Status: AC
  Administered 2014-07-14: 30 mL via ORAL
  Filled 2014-07-14: qty 30

## 2014-07-14 NOTE — Discharge Instructions (Signed)
Prilosec as prescribed.  Be sure to take your prednisone with food.  Return to the emergency department if your symptoms substantially worsen or change.   Chest Pain (Nonspecific) It is often hard to give a specific diagnosis for the cause of chest pain. There is always a chance that your pain could be related to something serious, such as a heart attack or a blood clot in the lungs. You need to follow up with your health care provider for further evaluation. CAUSES   Heartburn.  Pneumonia or bronchitis.  Anxiety or stress.  Inflammation around your heart (pericarditis) or lung (pleuritis or pleurisy).  A blood clot in the lung.  A collapsed lung (pneumothorax). It can develop suddenly on its own (spontaneous pneumothorax) or from trauma to the chest.  Shingles infection (herpes zoster virus). The chest wall is composed of bones, muscles, and cartilage. Any of these can be the source of the pain.  The bones can be bruised by injury.  The muscles or cartilage can be strained by coughing or overwork.  The cartilage can be affected by inflammation and become sore (costochondritis). DIAGNOSIS  Lab tests or other studies may be needed to find the cause of your pain. Your health care provider may have you take a test called an ambulatory electrocardiogram (ECG). An ECG records your heartbeat patterns over a 24-hour period. You may also have other tests, such as:  Transthoracic echocardiogram (TTE). During echocardiography, sound waves are used to evaluate how blood flows through your heart.  Transesophageal echocardiogram (TEE).  Cardiac monitoring. This allows your health care provider to monitor your heart rate and rhythm in real time.  Holter monitor. This is a portable device that records your heartbeat and can help diagnose heart arrhythmias. It allows your health care provider to track your heart activity for several days, if needed.  Stress tests by exercise or by giving  medicine that makes the heart beat faster. TREATMENT   Treatment depends on what may be causing your chest pain. Treatment may include:  Acid blockers for heartburn.  Anti-inflammatory medicine.  Pain medicine for inflammatory conditions.  Antibiotics if an infection is present.  You may be advised to change lifestyle habits. This includes stopping smoking and avoiding alcohol, caffeine, and chocolate.  You may be advised to keep your head raised (elevated) when sleeping. This reduces the chance of acid going backward from your stomach into your esophagus. Most of the time, nonspecific chest pain will improve within 2-3 days with rest and mild pain medicine.  HOME CARE INSTRUCTIONS   If antibiotics were prescribed, take them as directed. Finish them even if you start to feel better.  For the next few days, avoid physical activities that bring on chest pain. Continue physical activities as directed.  Do not use any tobacco products, including cigarettes, chewing tobacco, or electronic cigarettes.  Avoid drinking alcohol.  Only take medicine as directed by your health care provider.  Follow your health care provider's suggestions for further testing if your chest pain does not go away.  Keep any follow-up appointments you made. If you do not go to an appointment, you could develop lasting (chronic) problems with pain. If there is any problem keeping an appointment, call to reschedule. SEEK MEDICAL CARE IF:   Your chest pain does not go away, even after treatment.  You have a rash with blisters on your chest.  You have a fever. SEEK IMMEDIATE MEDICAL CARE IF:   You have increased chest pain  or pain that spreads to your arm, neck, jaw, back, or abdomen.  You have shortness of breath.  You have an increasing cough, or you cough up blood.  You have severe back or abdominal pain.  You feel nauseous or vomit.  You have severe weakness.  You faint.  You have  chills. This is an emergency. Do not wait to see if the pain will go away. Get medical help at once. Call your local emergency services (911 in [place] Do not drive yourself to the hospital. MAKE SURE YOU:   Understand these instructions.  Will watch your condition.  Will get help right away if you are not doing well or get worse. Document Released: 02/22/2005 Document Revised: 05/20/2013 Document Reviewed: 12/19/2007 [facility] Patient Information 2015 [place], [place]. This information is not intended to replace advice given to you by your health care provider. Make sure you discuss any questions you have with your health care provider.

## 2014-07-14 NOTE — ED Provider Notes (Signed)
CSN: [id number]     Arrival date & time 07/14/14  1809 History   First MD Initiated Contact with Patient 07/14/14 1812     Chief Complaint  Patient presents with  . Chest Pain     (Consider location/radiation/quality/duration/timing/severity/associated sxs/prior Treatment) HPI Comments: Patient is a 44 year old female with history of migraines, hypertension, and newly diagnosed sciatica. She is currently started on a prednisone taper and is scheduled for an MRI in the near future. He started taking this prednisone 5 days ago and since that time has developed a heaviness and pressure in the front of her chest. This is worse when she eats or drinks, lies flat, and is not related to exertion. There is no associated diaphoresis or radiation to the arm or jaw. She does feel intermittently short of breath.  Patient is a 44 y.o. female presenting with chest pain. The history is provided by the patient.  Chest Pain Pain location:  Substernal area Pain quality: pressure   Pain radiates to:  Does not radiate Pain radiates to the back: no   Pain severity:  Moderate Onset quality:  Sudden Duration:  3 days Timing:  Constant Progression:  Worsening Chronicity:  New Relieved by:  Nothing Worsened by:  Nothing tried Ineffective treatments:  None tried   Past Medical History  Diagnosis Date  . Migraines   . Gout   . Hypertension    Past Surgical History  Procedure Laterality Date  . Abdominal hysterectomy  2008   No family history on file. History  Substance Use Topics  . Smoking status: Current Every Day Smoker -- 0.10 packs/day    Types: Cigarettes  . Smokeless tobacco: Not on file  . Alcohol Use: Yes     Comment: occasional   OB History    No data available     Review of Systems  Cardiovascular: Positive for chest pain.  All other systems reviewed and are negative.     Allergies  Xanax  Home Medications   Prior to Admission medications   Medication Sig Start Date  End Date Taking? Authorizing Provider  amitriptyline (ELAVIL) 25 MG tablet Take 25 mg by mouth at bedtime.    Historical Provider, MD  colchicine 0.6 MG tablet Take 1 tablet (0.6 mg total) by mouth daily. 01/09/14   Rodolph BongEvan S Corey, MD  lovastatin (MEVACOR) 10 MG tablet Take 10 mg by mouth at bedtime.    Historical Provider, MD  Naproxen Sodium (ALEVE PO) Take 440 mg by mouth.     Historical Provider, MD  traMADol (ULTRAM) 50 MG tablet Take 1 tablet (50 mg total) by mouth every 6 (six) hours as needed. 01/09/14   Rodolph BongEvan S Corey, MD   BP 140/96 mmHg  Pulse 101  Temp(Src) 99 F (37.2 C) (Oral)  Resp 16  SpO2 98% Physical Exam  Constitutional: She is oriented to person, place, and time. She appears well-developed and well-nourished. No distress.  HENT:  Head: Normocephalic and atraumatic.  Neck: Normal range of motion. Neck supple.  Cardiovascular: Normal rate and regular rhythm.  Exam reveals no gallop and no friction rub.   No murmur heard. Pulmonary/Chest: Effort normal and breath sounds normal. No respiratory distress. She has no wheezes. She exhibits tenderness.  There is tenderness to palpation to the anterior chest that reproduces her symptoms.  Abdominal: Soft. Bowel sounds are normal. She exhibits no distension. There is no tenderness.  Musculoskeletal: Normal range of motion.  Neurological: She is alert and oriented to person,  place, and time.  Skin: Skin is warm and dry. She is not diaphoretic.  Nursing note and vitals reviewed.   ED Course  Procedures (including critical care time) Labs Review Labs Reviewed  COMPREHENSIVE METABOLIC PANEL  CBC WITH DIFFERENTIAL/PLATELET  TROPONIN I  LIPASE, BLOOD    Imaging Review No results found.   EKG Interpretation   Date/Time:  Tuesday July 14 2014 18:16:05 EST Ventricular Rate:  108 PR Interval:  146 QRS Duration: 77 QT Interval:  329 QTC Calculation: 441 R Axis:   76 Text Interpretation:  Sinus tachycardia Probable left  atrial enlargement  ST elev, probable normal early repol pattern Confirmed by DELOS  MD,  Keerat Denicola ([id number]) on 07/14/2014 6:20:17 PM      MDM   Final diagnoses:  None    Patient is a 44 year old female with no prior cardiac history. She presents with pressure in her chest and upper abdomen since beginning prednisone for sciatica. Her cardiac workup today is unremarkable and her symptoms are atypical for cardiac pain. She is feeling better with a GI cocktail and I suspect a GI etiology. She will be instructed to take Prilosec twice daily for the next 2 weeks. I've also recommended she take her prednisone with food.  She understands to return to the ER for symptoms substantially worsen or change.    Geoffery Lyons, MD 07/14/14 2022

## 2014-07-14 NOTE — ED Notes (Signed)
Pt arrives from home via [facility]. Pt reports having chest pain since beginning a predisone taper. Has c/o cp, sob, and dizziness. Pt reports chest is tender to touch and chest pain hurts more with movement or swallowing.

## 2015-03-01 ENCOUNTER — Encounter (HOSPITAL_COMMUNITY): Payer: Self-pay | Admitting: Emergency Medicine

## 2015-03-01 ENCOUNTER — Emergency Department (HOSPITAL_COMMUNITY)
Admission: EM | Admit: 2015-03-01 | Discharge: 2015-03-01 | Disposition: A | Discharge status: Home / Self Care | Payer: 59 | Attending: Emergency Medicine | Admitting: Emergency Medicine

## 2015-03-01 DIAGNOSIS — Z720 Tobacco use: Secondary | ICD-10-CM | POA: Insufficient documentation

## 2015-03-01 DIAGNOSIS — R42 Dizziness and giddiness: Secondary | ICD-10-CM | POA: Insufficient documentation

## 2015-03-01 DIAGNOSIS — R51 Headache: Secondary | ICD-10-CM | POA: Insufficient documentation

## 2015-03-01 DIAGNOSIS — H53149 Visual discomfort, unspecified: Secondary | ICD-10-CM | POA: Diagnosis not present

## 2015-03-01 DIAGNOSIS — Z8679 Personal history of other diseases of the circulatory system: Secondary | ICD-10-CM | POA: Insufficient documentation

## 2015-03-01 DIAGNOSIS — Z79899 Other long term (current) drug therapy: Secondary | ICD-10-CM | POA: Insufficient documentation

## 2015-03-01 DIAGNOSIS — Z8739 Personal history of other diseases of the musculoskeletal system and connective tissue: Secondary | ICD-10-CM | POA: Insufficient documentation

## 2015-03-01 DIAGNOSIS — R519 Headache, unspecified: Secondary | ICD-10-CM

## 2015-03-01 MED ORDER — SODIUM CHLORIDE 0.9 % IV BOLUS (SEPSIS)
1000.0000 mL | Freq: Once | INTRAVENOUS | Status: AC
  Administered 2015-03-01: 1000 mL via INTRAVENOUS

## 2015-03-01 MED ORDER — DIPHENHYDRAMINE HCL 50 MG/ML IJ SOLN
25.0000 mg | Freq: Once | INTRAMUSCULAR | Status: AC
  Administered 2015-03-01: 25 mg via INTRAVENOUS
  Filled 2015-03-01: qty 1

## 2015-03-01 MED ORDER — METOCLOPRAMIDE HCL 5 MG/ML IJ SOLN
10.0000 mg | Freq: Once | INTRAMUSCULAR | Status: AC
  Administered 2015-03-01: 10 mg via INTRAVENOUS
  Filled 2015-03-01: qty 2

## 2015-03-01 NOTE — Discharge Instructions (Signed)
Please read and follow all provided instructions.  Your diagnoses today include:  1. Acute nonintractable headache, unspecified headache type     Tests performed today include:  Vital signs. See below for your results today.   Medications:  In the Emergency Department you received:  Reglan - antinausea/headache medication  Benadryl - antihistamine to counteract potential side effects of reglan  Take any prescribed medications only as directed.  Additional information:  Follow any educational materials contained in this packet.  You are having a headache. No specific cause was found today for your headache. It may have been a migraine or other cause of headache. Stress, anxiety, fatigue, and depression are common triggers for headaches.   Your headache today does not appear to be life-threatening or require hospitalization, but often the exact cause of headaches is not determined in the emergency department. Therefore, follow-up with your doctor is very important to find out what may have caused your headache and whether or not you need any further diagnostic testing or treatment.   Sometimes headaches can appear benign (not harmful), but then more serious symptoms can develop which should prompt an immediate re-evaluation by your doctor or the emergency department.  BE VERY CAREFUL not to take multiple medicines containing Tylenol (also called acetaminophen). Doing so can lead to an overdose which can damage your liver and cause liver failure and possibly death.   Follow-up instructions: Please follow-up with your primary care provider in the next 3 days for further evaluation of your symptoms.   Return instructions:   Please return to the Emergency Department if you experience worsening symptoms.  Return if the medications do not resolve your headache, if it recurs, or if you have multiple episodes of vomiting or cannot keep down fluids.  Return if you have a change from the  usual headache.  RETURN IMMEDIATELY IF you:  Develop a sudden, severe headache  Develop confusion or become poorly responsive or faint  Develop a fever above 100.6F or problem breathing  Have a change in speech, vision, swallowing, or understanding  Develop new weakness, numbness, tingling, incoordination in your arms or legs  Have a seizure  Please return if you have any other emergent concerns.  Additional Information:  Your vital signs today were: BP 120/78 mmHg   Pulse 60   Temp(Src) 98 F (36.7 C) (Oral)   Resp 16   SpO2 100% If your blood pressure (BP) was elevated above 135/85 this visit, please have this repeated by your doctor within one month. --------------

## 2015-03-01 NOTE — ED Provider Notes (Signed)
CSN: [id number]     Arrival date & time 03/01/15  0621 History   First MD Initiated Contact with Patient 03/01/15 [phone]     Chief Complaint  Patient presents with  . Headache     (Consider location/radiation/quality/duration/timing/severity/associated sxs/prior Treatment) HPI Comments: Patient presents with complaint of headache. Patient states that she has had similar headaches in the past. She describes the pain is occipital with associated photophobia and phonophobia. No nausea or vomiting. Pain began yesterday morning. Patient took propanolol just prior to the headache starting. Patient describes some mild lightheadedness as well but no syncope. No head injury. No fever. Patient denies signs of stroke including: facial droop, slurred speech, aphasia, weakness/numbness in extremities, imbalance/trouble walking.   The history is provided by the patient.    Past Medical History  Diagnosis Date  . Migraines   . Gout   . Hypertension    Past Surgical History  Procedure Laterality Date  . Abdominal hysterectomy  2008   No family history on file. Social History  Substance Use Topics  . Smoking status: Current Every Day Smoker -- 0.10 packs/day    Types: Cigarettes  . Smokeless tobacco: None  . Alcohol Use: Yes     Comment: occasional   OB History    No data available     Review of Systems  Constitutional: Negative for fever.  HENT: Negative for congestion, dental problem, rhinorrhea and sinus pressure.   Eyes: Positive for photophobia. Negative for discharge, redness and visual disturbance.  Respiratory: Negative for shortness of breath.   Cardiovascular: Negative for chest pain.  Gastrointestinal: Negative for nausea and vomiting.  Musculoskeletal: Negative for gait problem, neck pain and neck stiffness.  Skin: Negative for rash.  Neurological: Positive for light-headedness and headaches. Negative for syncope, speech difficulty, weakness and numbness.   Psychiatric/Behavioral: Negative for confusion.      Allergies  Xanax  Home Medications   Prior to Admission medications   Medication Sig Start Date End Date Taking? Authorizing Provider  amitriptyline (ELAVIL) 25 MG tablet Take 25 mg by mouth at bedtime.    Historical Provider, MD  amLODipine (NORVASC) 10 MG tablet Take 10 mg by mouth daily.    Historical Provider, MD  lovastatin (MEVACOR) 10 MG tablet Take 10 mg by mouth at bedtime.    Historical Provider, MD  Naproxen Sodium (ALEVE PO) Take 440 mg by mouth.     Historical Provider, MD  omeprazole (PRILOSEC) 20 MG capsule Take 1 capsule (20 mg total) by mouth 2 (two) times daily. 07/14/14   Geoffery Lyons, MD   BP 139/90 mmHg  Pulse 70  Temp(Src) 98 F (36.7 C) (Oral)  Resp 16  SpO2 99% Physical Exam  Constitutional: She is oriented to person, place, and time. She appears well-developed and well-nourished.  Covering eyes during exam  HENT:  Head: Normocephalic and atraumatic.  Right Ear: Tympanic membrane, external ear and ear canal normal.  Left Ear: Tympanic membrane, external ear and ear canal normal.  Nose: Nose normal.  Mouth/Throat: Uvula is midline, oropharynx is clear and moist and mucous membranes are normal.  Eyes: Conjunctivae, EOM and lids are normal. Pupils are equal, round, and reactive to light. Right eye exhibits no nystagmus. Left eye exhibits no nystagmus.  Neck: Normal range of motion. Neck supple.  Cardiovascular: Normal rate and regular rhythm.   Pulmonary/Chest: Effort normal and breath sounds normal.  Abdominal: Soft. There is no tenderness.  Musculoskeletal:       Cervical back:  She exhibits normal range of motion, no tenderness and no bony tenderness.  Neurological: She is alert and oriented to person, place, and time. She has normal strength and normal reflexes. No cranial nerve deficit or sensory deficit. She displays a negative Romberg sign. Coordination and gait normal. GCS eye subscore is 4. GCS  verbal subscore is 5. GCS motor subscore is 6.  Skin: Skin is warm and dry.  Psychiatric: She has a normal mood and affect.  Nursing note and vitals reviewed.   ED Course  Procedures (including critical care time) Labs Review Labs Reviewed - No data to display  Imaging Review No results found. I have personally reviewed and evaluated these images and lab results as part of my medical decision-making.   EKG Interpretation None       7:07 AM Patient seen and examined. Work-up initiated. Medications ordered.   Vital signs reviewed and are as follows: BP 139/90 mmHg  Pulse 70  Temp(Src) 98 F (36.7 C) (Oral)  Resp 16  SpO2 99%  9:07 AM symptoms resolved. Patient wishes to go home. Will discharge.  Patient urged follow-up with PCP regarding management of headaches.  Patient urged to return with worsening symptoms or other concerns. Patient verbalized understanding and agrees with plan.      MDM   Final diagnoses:  Acute nonintractable headache, unspecified headache type   Patient without high-risk features of headache including: sudden onset/thunderclap HA, no similar headache in past, altered mental status, accompanying seizure, headache with exertion, age > 57, history of immunocompromise, neck or shoulder pain, fever, use of anticoagulation, family history of spontaneous SAH, concomitant drug use, toxic exposure.   Patient has a normal complete neurological exam, normal vital signs, normal level of consciousness, no signs of meningismus, is well-appearing/non-toxic appearing, no signs of trauma.   Imaging with CT/MRI not indicated given history and physical exam findings.   No dangerous or life-threatening conditions suspected or identified by history, physical exam, and by work-up. No indications for hospitalization identified.     Renne Crigler, PA-C 03/01/15 1610  Gerhard Munch, MD 03/02/15 [phone]

## 2015-03-01 NOTE — ED Notes (Signed)
Patient with headache since yesterday morning.  Patient states she took her propranolol and started having a headache and it has continued on into today.  Patient states she has not had any nausea or vomiting.  Patient does have some sensitivity to light and sound.  She does have dizziness with the headache.

## 2015-07-28 ENCOUNTER — Emergency Department (INDEPENDENT_AMBULATORY_CARE_PROVIDER_SITE_OTHER): Payer: 59

## 2015-07-28 ENCOUNTER — Emergency Department (INDEPENDENT_AMBULATORY_CARE_PROVIDER_SITE_OTHER)
Admission: EM | Admit: 2015-07-28 | Discharge: 2015-07-28 | Disposition: A | Discharge status: Home / Self Care | Payer: 59 | Source: Home / Self Care | Attending: Family Medicine | Admitting: Family Medicine

## 2015-07-28 ENCOUNTER — Encounter (HOSPITAL_COMMUNITY): Payer: Self-pay | Admitting: Emergency Medicine

## 2015-07-28 DIAGNOSIS — S46001A Unspecified injury of muscle(s) and tendon(s) of the rotator cuff of right shoulder, initial encounter: Secondary | ICD-10-CM

## 2015-07-28 MED ORDER — PREDNISONE 50 MG PO TABS: 50.0000 mg | ORAL_TABLET | Freq: Every day | ORAL | Status: DC

## 2015-07-28 NOTE — ED Notes (Signed)
Patient requested papers to be faxed-faxed papers for patient and returned papers to patient with fax verified form

## 2015-07-28 NOTE — ED Notes (Signed)
Patient is right handed pain in the back of shoulder.  Painful to elevate right arm.  Noticed right hand swelling.  Radial pulse in right wrist is 2 plus, no pain in hand, but hand is swollen.  Patient denies injury, but reports performing a lot of repetitive duties with right arm.

## 2015-07-28 NOTE — Discharge Instructions (Signed)
Rotator Cuff Injury Rotator cuff injury is any type of injury to the set of muscles and tendons that make up the stabilizing unit of your shoulder. This unit holds the ball of your upper arm bone (humerus) in the socket of your shoulder blade (scapula).  CAUSES Injuries to your rotator cuff most commonly come from sports or activities that cause your arm to be moved repeatedly over your head. Examples of this include throwing, weight lifting, swimming, or racquet sports. Long lasting (chronic) irritation of your rotator cuff can cause soreness and swelling (inflammation), bursitis, and eventual damage to your tendons, such as a tear (rupture). SIGNS AND SYMPTOMS Acute rotator cuff tear:  Sudden tearing sensation followed by severe pain shooting from your upper shoulder down your arm toward your elbow.  Decreased range of motion of your shoulder because of pain and muscle spasm.  Severe pain.  Inability to raise your arm out to the side because of pain and loss of muscle power (large tears). Chronic rotator cuff tear:  Pain that usually is worse at night and may interfere with sleep.  Gradual weakness and decreased shoulder motion as the pain worsens.  Decreased range of motion. Rotator cuff tendinitis:  Deep ache in your shoulder and the outside upper arm over your shoulder.  Pain that comes on gradually and becomes worse when lifting your arm to the side or turning it inward. DIAGNOSIS Rotator cuff injury is diagnosed through a medical history, physical exam, and imaging exam. The medical history helps determine the type of rotator cuff injury. Your health care provider will look at your injured shoulder, feel the injured area, and ask you to move your shoulder in different positions. X-ray exams typically are done to rule out other causes of shoulder pain, such as fractures. MRI is the exam of choice for the most severe shoulder injuries because the images show muscles and tendons.    TREATMENT  Chronic tear:  Medicine for pain, such as acetaminophen or ibuprofen.  Physical therapy and range-of-motion exercises may be helpful in maintaining shoulder function and strength.  Steroid injections into your shoulder joint.  Surgical repair of the rotator cuff if the injury does not heal with noninvasive treatment. Acute tear:  Anti-inflammatory medicines such as ibuprofen and naproxen to help reduce pain and swelling.  A sling to help support your arm and rest your rotator cuff muscles. Long-term use of a sling is not advised. It may cause significant stiffening of the shoulder joint.  Surgery may be considered within a few weeks, especially in younger, active people, to return the shoulder to full function.  Indications for surgical treatment include the following:  Age younger than 60 years.  Rotator cuff tears that are complete.  Physical therapy, rest, and anti-inflammatory medicines have been used for 6-8 weeks, with no improvement.  Employment or sporting activity that requires constant shoulder use. Tendinitis:  Anti-inflammatory medicines such as ibuprofen and naproxen to help reduce pain and swelling.  A sling to help support your arm and rest your rotator cuff muscles. Long-term use of a sling is not advised. It may cause significant stiffening of the shoulder joint.  Severe tendinitis may require:  Steroid injections into your shoulder joint.  Physical therapy.  Surgery. HOME CARE INSTRUCTIONS   Apply ice to your injury:  Put ice in a plastic bag.  Place a towel between your skin and the bag.  Leave the ice on for 20 minutes, 2-3 times a day.  If you  have a shoulder immobilizer (sling and straps), wear it until told otherwise by your health care provider.  You may want to sleep on several pillows or in a recliner at night to lessen swelling and pain.  Only take over-the-counter or prescription medicines for pain, discomfort, or fever as  directed by your health care provider.  Do simple hand squeezing exercises with a soft rubber ball to decrease hand swelling. SEEK MEDICAL CARE IF:   Your shoulder pain increases, or new pain or numbness develops in your arm, hand, or fingers.  Your hand or fingers are colder than your other hand. SEEK IMMEDIATE MEDICAL CARE IF:   Your arm, hand, or fingers are numb or tingling.  Your arm, hand, or fingers are increasingly swollen and painful, or they turn white or blue. MAKE SURE YOU:  Understand these instructions.  Will watch your condition.  Will get help right away if you are not doing well or get worse.   This information is not intended to replace advice given to you by your health care provider. Make sure you discuss any questions you have with your health care provider.   Document Released: 05/12/2000 Document Revised: 05/20/2013 Document Reviewed: 12/25/2012 Elsevier Interactive Patient Education 2016 [employer]. How to Use a Sling A sling is a type of hanging bandage. You wear it around your neck to protect an injured arm, shoulder, or other body part. You may need to wear a sling to keep you from moving the injured body part while it heals. Keeping the injured part of your body still reduces pain and speeds up healing. Your doctor may suggest you use a sling if you have:  A broken arm.  A broken collarbone.  A shoulder injury.  Surgery. RISKS AND COMPLICATIONS Wearing a sling the wrong way can:  Make your injury worse.  Cause stiffness or numbness.  Affect blood circulation in your arm and hand. This can cause tingling or numbness in your fingers or hands. HOW TO USE A SLING The way that you should use a sling depends on your injury. It is important that you follow all of your doctor's instructions for your injury. Also follow these general suggestions:  Wear the sling so that your arm bends 90 degrees at the elbow. That is like a right angle or the shape  of a capital letter "L." The sling should also support your wrist and hand.  Try not to move your arm.  Do not lie down flat on your back while you have to wear a sling. Sleep in a recliner or use pillows to raise your upper body in bed.  Do not twist, raise, or move your arm in a way that could make your injury worse.  Do not lean on your arm while you have to wear a sling.  Do not lift anything while you have to wear a sling. GET HELP IF:  You have bruising, swelling, or pain that is getting worse.  Your pain medicine is not helping.  You have a fever. GET HELP RIGHT AWAY IF:  Your fingers are numb or tingling.  Your fingers turn blue or feel cold to the touch.  You cannot control the bleeding from your injury.  You are short of breath.   This information is not intended to replace advice given to you by your health care provider. Make sure you discuss any questions you have with your health care provider.   Document Released: 08/09/2009 Document Revised: 06/05/2014 Document  Reviewed: 03/18/2014 Elsevier Interactive Patient Education [employer]

## 2015-07-29 NOTE — ED Provider Notes (Signed)
CSN: [id number]     Arrival date & time 07/28/15  1340 History   First MD Initiated Contact with Patient 07/28/15 1518     Chief Complaint  Patient presents with  . Arm Pain   (Consider location/radiation/quality/duration/timing/severity/associated sxs/prior Treatment) HPI History obtained from patient:   LOCATION: right shoulder SEVERITY:6 DURATION:over 1 week CONTEXT:unsure, but does heavy lifting at work QUALITY: MODIFYING FACTORS:ibuprofen ASSOCIATED SYMPTOMS:decreased motion in shoulder TIMING:now constant  Past Medical History  Diagnosis Date  . Migraines   . Gout   . Hypertension    Past Surgical History  Procedure Laterality Date  . Abdominal hysterectomy  2008   No family history on file. Social History  Substance Use Topics  . Smoking status: Current Every Day Smoker -- 0.10 packs/day    Types: Cigarettes  . Smokeless tobacco: None  . Alcohol Use: Yes     Comment: occasional   OB History    No data available     Review of Systems Right shoulder pain. Allergies  Xanax  Home Medications   Prior to Admission medications   Medication Sig Start Date End Date Taking? Authorizing Provider  ibuprofen (ADVIL,MOTRIN) 200 MG tablet Take 200 mg by mouth every 6 (six) hours as needed.   Yes Historical Provider, MD  aspirin-acetaminophen-caffeine (EXCEDRIN MIGRAINE) [phone] MG tablet Take 2 tablets by mouth every 6 (six) hours as needed for headache or migraine.    Historical Provider, MD  predniSONE (DELTASONE) 50 MG tablet Take 1 tablet (50 mg total) by mouth daily. 07/28/15   Tharon Aquas, PA  propranolol (INDERAL) 40 MG tablet Take 40 mg by mouth 2 (two) times daily.    Historical Provider, MD   Meds Ordered and Administered this Visit  Medications - No data to display  BP 140/72 mmHg  Pulse 87  Temp(Src) 98 F (36.7 C) (Oral)  Resp 18  SpO2 100% No data found.   Physical Exam  Constitutional: She is oriented to person, place, and time. She  appears well-developed and well-nourished.  HENT:  Head: Normocephalic and atraumatic.  Eyes: Conjunctivae are normal.  Pulmonary/Chest: Effort normal and breath sounds normal. No respiratory distress.  Musculoskeletal: She exhibits no edema.       Right shoulder: She exhibits decreased range of motion, tenderness, pain and decreased strength. She exhibits no bony tenderness, no swelling, no crepitus and no deformity.  Neurological: She is alert and oriented to person, place, and time.  Skin: Skin is warm and dry.  Psychiatric: She has a normal mood and affect. Her behavior is normal.  Nursing note and vitals reviewed.   ED Course  Procedures (including critical care time)  Labs Review Labs Reviewed - No data to display  Imaging Review Dg Shoulder Right  07/28/2015  CLINICAL DATA:  Shoulder pain, 1 week duration.  Lifting injury. EXAM: RIGHT SHOULDER - 2+ VIEW COMPARISON:  None. FINDINGS: Glenohumeral joint is normal. AC joint is normal. Normal humeral acromial distance. The acromion shows a 0.8 lateral margin, which could predispose to rotator cuff disease. Regional ribs appear normal. IMPRESSION: No acute finding. Lateral pointed acromion could predispose to rotator cuff disease. Electronically Signed   By: Paulina Fusi M.D.   On: 07/28/2015 15:54     Visual Acuity Review  Right Eye Distance:   Left Eye Distance:   Bilateral Distance:    Right Eye Near:   Left Eye Near:    Bilateral Near:        rx for prednisone,  sling and review by ortho if not steadily getting better.  Review of XR with patient MDM   1. Rotator cuff injury, right, initial encounter    Patient is reassured that there are no issues that require transfer to higher level of care at this time.  Patient is advised to continue home symptomatic treatment. Prescription is sent to  pharmacy patient has indicated.  Patient is advised that if there are new or worsening symptoms or attend the emergency  department, or contact primary care provider. Instructions of care provided discharged home in stable condition. Return to work/school note provided.  THIS NOTE WAS GENERATED USING A VOICE RECOGNITION SOFTWARE PROGRAM. ALL REASONABLE EFFORTS  WERE MADE TO PROOFREAD THIS DOCUMENT FOR ACCURACY.     Tharon Aquas, [place] 07/29/15 [phone]

## 2016-04-29 ENCOUNTER — Emergency Department (HOSPITAL_COMMUNITY)
Admission: EM | Admit: 2016-04-29 | Discharge: 2016-04-29 | Disposition: A | Discharge status: Home / Self Care | Payer: 59 | Attending: Emergency Medicine | Admitting: Emergency Medicine

## 2016-04-29 ENCOUNTER — Encounter (HOSPITAL_COMMUNITY): Payer: Self-pay | Admitting: *Deleted

## 2016-04-29 ENCOUNTER — Emergency Department (HOSPITAL_COMMUNITY): Payer: 59

## 2016-04-29 DIAGNOSIS — I10 Essential (primary) hypertension: Secondary | ICD-10-CM | POA: Insufficient documentation

## 2016-04-29 DIAGNOSIS — M79675 Pain in left toe(s): Secondary | ICD-10-CM

## 2016-04-29 DIAGNOSIS — F17210 Nicotine dependence, cigarettes, uncomplicated: Secondary | ICD-10-CM | POA: Insufficient documentation

## 2016-04-29 DIAGNOSIS — Z7982 Long term (current) use of aspirin: Secondary | ICD-10-CM | POA: Insufficient documentation

## 2016-04-29 NOTE — ED Notes (Signed)
Pt understood dc material. NAD noted. 

## 2016-04-29 NOTE — ED Provider Notes (Signed)
[facility]-EMERGENCY DEPT Provider Note   CSN: [id number] Arrival date & time: 04/29/16  1819  By signing my name below, I, Valentino Saxon, attest that this documentation has been prepared under the direction and in the presence of Buel Ream, [place]. Electronically Signed: Valentino Saxon, ED Scribe. 04/29/16. 7:39 PM.  History   Chief Complaint Chief Complaint  Patient presents with  . Toe Pain   The history is provided by the patient. No language interpreter was used.   HPI Comments: [name] is a 45 y.o. female with a hx of gout and HTN, who presents to the Emergency Department complaining of gradually worsening, left great toe pain onset four weeks ago. She reports associated throbbing, shooting pain through her left great toe with a burning sensation. Pt states she believed her left great toe pain was related to her gout and took her prescribed naproxen around 04/10/16 for about a week with no relief. Pt reports pain is worsened with bearing weight and putting shoes on. She notes soaking her feet in an epsom salt bath with no relief. Pt also states her pain became unbearable today at ~10 am and took tylenol with no relief. Pt denies injury to the affected area. Pt denies numbness, tinglingness, fever. No additional complaints at this time.   Past Medical History:  Diagnosis Date  . Gout   . Hypertension   . Migraines     There are no active problems to display for this patient.   Past Surgical History:  Procedure Laterality Date  . ABDOMINAL HYSTERECTOMY  2008    OB History    No data available       Home Medications    Prior to Admission medications   Medication Sig Start Date End Date Taking? Authorizing Provider  aspirin-acetaminophen-caffeine (EXCEDRIN MIGRAINE) [phone] MG tablet Take 2 tablets by mouth every 6 (six) hours as needed for headache or migraine.    Historical Provider, MD  ibuprofen (ADVIL,MOTRIN) 200 MG tablet Take 200 mg by mouth every 6  (six) hours as needed.    Historical Provider, MD  predniSONE (DELTASONE) 50 MG tablet Take 1 tablet (50 mg total) by mouth daily. 07/28/15   Tharon Aquas, PA  propranolol (INDERAL) 40 MG tablet Take 40 mg by mouth 2 (two) times daily.    Historical Provider, MD    Family History History reviewed. No pertinent family history.  Social History Social History  Substance Use Topics  . Smoking status: Current Every Day Smoker    Packs/day: 0.10    Types: Cigarettes  . Smokeless tobacco: Not on file  . Alcohol use Yes     Comment: occasional     Allergies   Xanax [alprazolam]   Review of Systems Review of Systems  Constitutional: Negative for chills and fever.  HENT: Negative for facial swelling and sore throat.   Respiratory: Negative for shortness of breath.   Cardiovascular: Negative for chest pain.  Gastrointestinal: Negative for abdominal pain, nausea and vomiting.  Genitourinary: Negative for dysuria.  Musculoskeletal: Positive for arthralgias (left grand toe ). Negative for back pain.  Skin: Negative for rash and wound.  Neurological: Negative for numbness and headaches.  Psychiatric/Behavioral: The patient is not nervous/anxious.      Physical Exam Updated Vital Signs BP 125/74 (BP Location: Right Arm)   Pulse 67   Temp 98 F (36.7 C) (Oral)   Resp 16   SpO2 100%   Physical Exam  Constitutional: She appears well-developed and well-nourished.  No distress.  HENT:  Head: Normocephalic and atraumatic.  Mouth/Throat: Oropharynx is clear and moist. No oropharyngeal exudate.  Eyes: Conjunctivae are normal. Pupils are equal, round, and reactive to light. Right eye exhibits no discharge. Left eye exhibits no discharge. No scleral icterus.  Neck: Normal range of motion. Neck supple. No thyromegaly present.  Cardiovascular: Normal rate, regular rhythm, normal heart sounds and intact distal pulses.  Exam reveals no gallop and no friction rub.   No murmur  heard. Pulmonary/Chest: Effort normal and breath sounds normal. No stridor. No respiratory distress. She has no wheezes. She has no rales.  Abdominal: Soft. Bowel sounds are normal. She exhibits no distension. There is no tenderness. There is no rebound and no guarding.  Musculoskeletal: She exhibits no edema.  Lymphadenopathy:    She has no cervical adenopathy.  Neurological: She is alert. Coordination normal.  Skin: Skin is warm and dry. No rash noted. She is not diaphoretic. No pallor.  Tender, indurated area to patient's and lateral left great toe; no fluctuance; no bony tenderness; normal sensation; decreased range of motion due to pain; cap refill<2secs; see image  Psychiatric: She has a normal mood and affect.  Nursing note and vitals reviewed.      ED Treatments / Results   DIAGNOSTIC STUDIES: Oxygen Saturation is 100% on RA, normal by my interpretation.    COORDINATION OF CARE: 7:35 PM Discussed treatment plan with pt at bedside which includes left great toe XR and f/u with Pediatrist and pt agreed to plan.  Labs (all labs ordered are listed, but only abnormal results are displayed) Labs Reviewed - No data to display  EKG  EKG Interpretation None       Radiology Dg Toe Great Left  Result Date: 04/29/2016 CLINICAL DATA:  Moderate constant left great toe pain. Sore along the lateral aspect of the great toe. EXAM: LEFT GREAT TOE COMPARISON:  Left foot radiographs 10/21/2008 FINDINGS: Soft tissue swelling and possible ulceration are noted along the lateral aspect of the great toe at the IP joint level. No acute or focal osseous abnormality is present. IMPRESSION: 1. Soft tissue swelling along the lateral aspect of the great head without osseous involvement. This may represent focal ulceration or cellulitis. Electronically Signed   By: Marin Robertshristopher  Mattern M.D.   On: 04/29/2016 19:30    Procedures Procedures (including critical care time)  Medications Ordered in  ED Medications - No data to display   Initial Impression / Assessment and Plan / ED Course  I have reviewed the triage vital signs and the nursing notes.  Pertinent labs & imaging results that were available during my care of the patient were reviewed by me and considered in my medical decision making (see chart for details).  Clinical Course     Left great toe x-ray shows soft tissue swelling along the lateral aspect of the great head without osseous involvement; may represent a focal ulceration or cellulitis. Doubt infectious cause considering no erythema or warmth. Suspect possible wart or callus. No further management would be indicated in the emergency department. Refer to podiatry for further evaluation and treatment. Patient given postop shoe for comfort. I offered crutches but patient declined. Return precautions discussed. Patient understands and agrees to plan. I discussed patient case with Dr. Jacqulyn BathLong who guided the patient's management and agrees with plan. Patient vitals stable throughout ED course and discharged in satisfactory condition.  Final Clinical Impressions(s) / ED Diagnoses   Final diagnoses:  Pain of toe of  left foot    New Prescriptions Discharge Medication List as of 04/29/2016  7:49 PM     I personally performed the services described in this documentation, which was scribed in my presence. The recorded information has been reviewed and is accurate.     Emi Holeslexandra M Zeddie Njie, PA-C 04/29/16 2038    Maia PlanJoshua G Long, MD 04/30/16 1136

## 2016-04-29 NOTE — Discharge Instructions (Signed)
Treatment: Wear postop shoe for comfort. You can use warm soaks with or without Epsom salts for relief. You can take ibuprofen or Tylenol as prescribed over-the-counter for your pain.  Follow-up: [facility] for further evaluation and treatment of your symptoms. Please return to emergency department if you develop any new or worsening symptoms.

## 2016-04-29 NOTE — ED Triage Notes (Signed)
Pt reports left big toe pain for over a month. Mild swelling noted. Ambulatory at triage

## 2016-05-05 ENCOUNTER — Ambulatory Visit: Payer: Self-pay | Admitting: Podiatry

## 2016-07-27 ENCOUNTER — Encounter (HOSPITAL_COMMUNITY): Payer: Self-pay | Admitting: Emergency Medicine

## 2016-07-27 ENCOUNTER — Ambulatory Visit (HOSPITAL_COMMUNITY)
Admission: EM | Admit: 2016-07-27 | Discharge: 2016-07-27 | Disposition: A | Discharge status: Home / Self Care | Payer: BLUE CROSS/BLUE SHIELD | Attending: Internal Medicine | Admitting: Internal Medicine

## 2016-07-27 DIAGNOSIS — S86912A Strain of unspecified muscle(s) and tendon(s) at lower leg level, left leg, initial encounter: Secondary | ICD-10-CM | POA: Diagnosis not present

## 2016-07-27 MED ORDER — DICLOFENAC SODIUM 75 MG PO TBEC: 75.0000 mg | DELAYED_RELEASE_TABLET | Freq: Two times a day (BID) | ORAL | 0 refills | Status: DC

## 2016-07-27 MED ORDER — CYCLOBENZAPRINE HCL 10 MG PO TABS: 10.0000 mg | ORAL_TABLET | Freq: Two times a day (BID) | ORAL | 0 refills | Status: DC | PRN

## 2016-07-27 NOTE — ED Provider Notes (Signed)
CSN: [id number]     Arrival date & time 07/27/16  1647 History   First MD Initiated Contact with Patient 07/27/16 1737     Chief Complaint  Patient presents with  . Leg Pain   (Consider location/radiation/quality/duration/timing/severity/associated sxs/prior Treatment) 46 year old female presents to clinic with a sudden onset of sharp pain to her lower left leg. States she was at work today spends most of her days on her feet, and then the pain occurred suddenly. She denies history of recent travel, recent hospitalization, does not take oral contraceptives no recent history of bedrest, however she does smoke. She has no chest pain, no shortness of breath, no exertional dyspnea, no nausea, no vomiting, and no signs of systemic illness.   The history is provided by the patient.  Leg Pain    Past Medical History:  Diagnosis Date  . Gout   . Hypertension   . Migraines    Past Surgical History:  Procedure Laterality Date  . ABDOMINAL HYSTERECTOMY  2008   History reviewed. No pertinent family history. Social History  Substance Use Topics  . Smoking status: Current Every Day Smoker    Packs/day: 0.10    Types: Cigarettes  . Smokeless tobacco: Never Used  . Alcohol use Yes     Comment: occasional   OB History    No data available     Review of Systems  Reason unable to perform ROS: As covered in history of present illness.  All other systems reviewed and are negative.   Allergies  Xanax [alprazolam]  Home Medications   Prior to Admission medications   Medication Sig Start Date End Date Taking? Authorizing Provider  aspirin-acetaminophen-caffeine (EXCEDRIN MIGRAINE) [phone] MG tablet Take 2 tablets by mouth every 6 (six) hours as needed for headache or migraine.    Historical Provider, MD  cyclobenzaprine (FLEXERIL) 10 MG tablet Take 1 tablet (10 mg total) by mouth 2 (two) times daily as needed for muscle spasms. 07/27/16   Dorena BodoLawrence Evaleigh Mccamy, NP  diclofenac (VOLTAREN) 75 MG  EC tablet Take 1 tablet (75 mg total) by mouth 2 (two) times daily. 07/27/16   Dorena BodoLawrence Gianlucas Evenson, NP  ibuprofen (ADVIL,MOTRIN) 200 MG tablet Take 200 mg by mouth every 6 (six) hours as needed.    Historical Provider, MD  predniSONE (DELTASONE) 50 MG tablet Take 1 tablet (50 mg total) by mouth daily. 07/28/15   Tharon AquasFrank C Patrick, PA  propranolol (INDERAL) 40 MG tablet Take 40 mg by mouth 2 (two) times daily.    Historical Provider, MD   Meds Ordered and Administered this Visit  Medications - No data to display  BP 142/87 (BP Location: Left Arm)   Pulse 85   Temp 98 F (36.7 C) (Oral)   Resp 20   SpO2 100%  No data found.   Physical Exam  Constitutional: She is oriented to person, place, and time. She appears well-developed and well-nourished. No distress.  HENT:  Head: Normocephalic and atraumatic.  Eyes: EOM are normal. Pupils are equal, round, and reactive to light.  Neck: Normal range of motion. Neck supple. No JVD present.  Cardiovascular: Normal rate and regular rhythm.   Pulmonary/Chest: Effort normal and breath sounds normal. No respiratory distress. She has no wheezes.  Abdominal: Soft. Bowel sounds are normal.  Musculoskeletal: Normal range of motion. She exhibits no edema or deformity.       Left lower leg: She exhibits tenderness. She exhibits no bony tenderness, no swelling, no edema, no deformity and no  laceration.  Tenderness to palpation on the lateral side of the calf muscle. No palpable cord, no visible swelling, no erythema, or other visible signs.  Lymphadenopathy:    She has no cervical adenopathy.  Neurological: She is alert and oriented to person, place, and time.  Skin: Skin is warm and dry. Capillary refill takes less than 2 seconds. She is not diaphoretic.  Psychiatric: She has a normal mood and affect.  Nursing note and vitals reviewed.   Urgent Care Course     Procedures (including critical care time)  Labs Review Labs Reviewed - No data to  display  Imaging Review No results found.   Visual Acuity Review  Right Eye Distance:   Left Eye Distance:   Bilateral Distance:    Right Eye Near:   Left Eye Near:    Bilateral Near:         MDM   1. Muscle strain of left lower leg, initial encounter    I'm treating you for a muscle strain in your lower leg. Prescribed 2 medicines. The first is Flexeril, take one tablet twice a day as needed. This medicine may cause drowsiness, I recommend against driving or operating heavy machinery treating this medicine affects you. I would also avoid drinking alcohol while on this medicine the other medicine is called diclofenac, take one tablet twice a day. Should your symptoms continue, follow up with your primary care provider or return to clinic as needed.     Dorena Bodo, NP 07/27/16 1759

## 2016-07-27 NOTE — Discharge Instructions (Signed)
I'm treating you for a muscle strain in your lower leg. Prescribed 2 medicines. The first is Flexeril, take one tablet twice a day as needed. This medicine may cause drowsiness, I recommend against driving or operating heavy machinery treating this medicine affects you. I would also avoid drinking alcohol while on this medicine the other medicine is called diclofenac, take one tablet twice a day. Should your symptoms continue, follow up with your primary care provider or return to clinic as needed.

## 2016-07-27 NOTE — ED Triage Notes (Signed)
Here for LLE pain onset today  Reports pain is sharp  Denies inj/trauma, warmth, redness  Works on her feet all day... Pain increases w/activity  A&O x4... NAD

## 2016-09-09 ENCOUNTER — Emergency Department (HOSPITAL_COMMUNITY)
Admission: EM | Admit: 2016-09-09 | Discharge: 2016-09-10 | Disposition: A | Discharge status: Home / Self Care | Payer: BLUE CROSS/BLUE SHIELD | Attending: Emergency Medicine | Admitting: Emergency Medicine

## 2016-09-09 ENCOUNTER — Encounter (HOSPITAL_COMMUNITY): Payer: Self-pay | Admitting: Emergency Medicine

## 2016-09-09 ENCOUNTER — Emergency Department (HOSPITAL_COMMUNITY): Payer: BLUE CROSS/BLUE SHIELD

## 2016-09-09 DIAGNOSIS — I10 Essential (primary) hypertension: Secondary | ICD-10-CM | POA: Insufficient documentation

## 2016-09-09 DIAGNOSIS — R51 Headache: Secondary | ICD-10-CM | POA: Diagnosis present

## 2016-09-09 DIAGNOSIS — Z7982 Long term (current) use of aspirin: Secondary | ICD-10-CM | POA: Diagnosis not present

## 2016-09-09 DIAGNOSIS — R519 Headache, unspecified: Secondary | ICD-10-CM

## 2016-09-09 DIAGNOSIS — Z79899 Other long term (current) drug therapy: Secondary | ICD-10-CM | POA: Insufficient documentation

## 2016-09-09 DIAGNOSIS — F17210 Nicotine dependence, cigarettes, uncomplicated: Secondary | ICD-10-CM | POA: Diagnosis not present

## 2016-09-09 MED ORDER — METOCLOPRAMIDE HCL 5 MG/ML IJ SOLN
10.0000 mg | Freq: Once | INTRAMUSCULAR | Status: AC
  Administered 2016-09-10: 10 mg via INTRAVENOUS
  Filled 2016-09-09: qty 2

## 2016-09-09 MED ORDER — DIPHENHYDRAMINE HCL 50 MG/ML IJ SOLN
25.0000 mg | Freq: Once | INTRAMUSCULAR | Status: AC
  Administered 2016-09-10: 25 mg via INTRAVENOUS
  Filled 2016-09-09: qty 1

## 2016-09-09 NOTE — ED Notes (Signed)
Taken to CT at this time. 

## 2016-09-09 NOTE — ED Provider Notes (Signed)
[facility]EMER[facility]Y DEPT Provider Note   CSN: [id number] Arrival date & time: 09/09/16  2302     History   Chief Complaint Chief Complaint  Patient presents with  . Headache    HPI [name] is a 46 y.o. female.  Patient with h/o migraine HA presents with c/o HA worse than typical. Three days ago she developed acute onset of HA with neck and shoulder tenderness, lower back pain. She reports that 'I just hurt everywhere'. She had this for 3 days with photophobia. No neck stiffness of fever. No head injury. Associated nausea but no vomiting. She reports R arm and leg weakness but states it hurts to move these. She states she cannot run due to this. No dental pain or sinus pressure. Excedrin has not been helping. The onset of this condition was acute. The course is constant. Aggravating factors: light, movement. Alleviating factors: none.        Past Medical History:  Diagnosis Date  . Gout   . Hypertension   . Migraines     There are no active problems to display for this patient.   Past Surgical History:  Procedure Laterality Date  . ABDOMINAL HYSTERECTOMY  2008    OB History    No data available       Home Medications    Prior to Admission medications   Medication Sig Start Date End Date Taking? Authorizing Provider  aspirin-acetaminophen-caffeine (EXCEDRIN MIGRAINE) [phone] MG tablet Take 2 tablets by mouth every 6 (six) hours as needed for headache or migraine.    Historical Provider, MD  cyclobenzaprine (FLEXERIL) 10 MG tablet Take 1 tablet (10 mg total) by mouth 2 (two) times daily as needed for muscle spasms. 07/27/16   Dorena Bodo, NP  diclofenac (VOLTAREN) 75 MG EC tablet Take 1 tablet (75 mg total) by mouth 2 (two) times daily. 07/27/16   Dorena Bodo, NP  ibuprofen (ADVIL,MOTRIN) 200 MG tablet Take 200 mg by mouth every 6 (six) hours as needed.    Historical Provider, MD  predniSONE (DELTASONE) 50 MG tablet Take 1 tablet (50 mg total) by  mouth daily. 07/28/15   Tharon Aquas, PA  propranolol (INDERAL) 40 MG tablet Take 40 mg by mouth 2 (two) times daily.    Historical Provider, MD    Family History History reviewed. No pertinent family history.  Social History Social History  Substance Use Topics  . Smoking status: Current Every Day Smoker    Packs/day: 0.10    Types: Cigarettes  . Smokeless tobacco: Never Used  . Alcohol use Yes     Comment: occasional     Allergies   Xanax [alprazolam]   Review of Systems Review of Systems  Constitutional: Negative for fever.  HENT: Negative for congestion, dental problem, rhinorrhea and sinus pressure.   Eyes: Positive for photophobia and visual disturbance (blurry vision). Negative for discharge and redness.  Respiratory: Negative for shortness of breath.   Cardiovascular: Negative for chest pain.  Gastrointestinal: Negative for nausea and vomiting.  Musculoskeletal: Positive for back pain, myalgias and neck pain. Negative for gait problem and neck stiffness.  Skin: Negative for rash.  Neurological: Positive for weakness and headaches. Negative for syncope, speech difficulty, light-headedness and numbness.  Psychiatric/Behavioral: Negative for confusion.     Physical Exam Updated Vital Signs BP (!) 146/86 (BP Location: Right Arm)   Pulse (!) 107   Temp 97.9 F (36.6 C) (Oral)   Resp 16   Ht  (1.702 m)  Wt 74.4 kg   SpO2 97%   BMI 25.69 kg/m   Physical Exam  Constitutional: She is oriented to person, place, and time. She appears well-developed and well-nourished. She appears distressed (anxious and tearful).  HENT:  Head: Normocephalic and atraumatic.  Right Ear: Tympanic membrane, external ear and ear canal normal.  Left Ear: Tympanic membrane, external ear and ear canal normal.  Nose: Nose normal.  Mouth/Throat: Uvula is midline, oropharynx is clear and moist and mucous membranes are normal.  Eyes: Conjunctivae, EOM and lids are normal. Pupils are  equal, round, and reactive to light. Right eye exhibits no nystagmus. Left eye exhibits no nystagmus.  Neck: Normal range of motion. Neck supple.  No meningismus.   Cardiovascular: Normal rate and regular rhythm.   Pulmonary/Chest: Effort normal and breath sounds normal.  Abdominal: Soft. There is no tenderness.  Musculoskeletal:       Cervical back: She exhibits normal range of motion, no tenderness and no bony tenderness.  Neurological: She is alert and oriented to person, place, and time. She has normal strength and normal reflexes. No cranial nerve deficit or sensory deficit. She displays a negative Romberg sign. Coordination and gait normal. GCS eye subscore is 4. GCS verbal subscore is 5. GCS motor subscore is 6.  Trace R upper and lower extremity weakness  Skin: Skin is warm and dry.  Psychiatric: She has a normal mood and affect.  Nursing note and vitals reviewed.    ED Treatments / Results   Radiology Ct Head Wo Contrast  Result Date: 09/10/2016 CLINICAL DATA:  Severe headache for 3 days EXAM: CT HEAD WITHOUT CONTRAST TECHNIQUE: Contiguous axial images were obtained from the base of the skull through the vertex without intravenous contrast. COMPARISON:  Head CT 02/09/2008 FINDINGS: Brain: No mass lesion, intraparenchymal hemorrhage or extra-axial collection. No evidence of acute cortical infarct. Brain parenchyma and CSF-containing spaces are normal for age. Vascular: No hyperdense vessel or unexpected calcification. Skull: Normal visualized skull base, calvarium and extracranial soft tissues. Sinuses/Orbits: No sinus fluid levels or advanced mucosal thickening. No mastoid effusion. Normal orbits. IMPRESSION: Normal head CT for age. Electronically Signed   By: Deatra Robinson M.D.   On: 09/10/2016 00:27    Procedures Procedures (including critical care time)  Medications Ordered in ED Medications  metoCLOPramide (REGLAN) injection 10 mg (10 mg Intravenous Given 09/10/16 0032)    diphenhydrAMINE (BENADRYL) injection 25 mg (25 mg Intravenous Given 09/10/16 0034)     Initial Impression / Assessment and Plan / ED Course  I have reviewed the triage vital signs and the nursing notes.  Pertinent labs & imaging results that were available during my care of the patient were reviewed by me and considered in my medical decision making (see chart for details).     Patient seen and examined. Work-up initiated. Medications ordered. Patient is tearful.   Vital signs reviewed and are as follows: BP (!) 146/86 (BP Location: Right Arm)   Pulse (!) 107   Temp 97.9 F (36.6 C) (Oral)   Resp 16   Ht  (1.702 m)   Wt 74.4 kg   SpO2 97%   BMI 25.69 kg/m   1:42 AM HA improved. Pt continues to describe R sided pain and weakness. Will have her ambulate in hall.   3:21 AM Patient discussed with and seen by Dr. Bebe Shaggy who has seen. She is feeling better. Cleared to go home.   Patient counseled to return if they have weakness in  their arms or legs, slurred speech, trouble walking or talking, confusion, trouble with their balance, or if they have any other concerns. Patient verbalizes understanding and agrees with plan.    Final Clinical Impressions(s) / ED Diagnoses   Final diagnoses:  Acute nonintractable headache, unspecified headache type   Patient has trace weakness R upper and lower extremity but this appears 2/2 pain, otherwise a normal complete neurological exam, normal vital signs, normal level of consciousness, no signs of meningismus, is well-appearing/non-toxic appearing, no signs of trauma.   CT imaging performed and is negative. Do not feel that occult subarachnoid is likely. Possibly complex migraine. Symptoms are improved with treatment in ED and pt is ambulatory. Patient discussed with and seen by Dr. Bebe Shaggy.   Return instructions as above.   No dangerous or life-threatening conditions suspected or identified by history, physical exam, and by work-up.  No indications for hospitalization identified.    New Prescriptions Discharge Medication List as of 09/10/2016  3:20 AM       Renne Crigler, PA-C 09/10/16 1610    Zadie Rhine, MD 09/10/16 [phone]

## 2016-09-09 NOTE — ED Triage Notes (Signed)
Pt presents to ED for assessment of headache starting Thursday at work and worsening in to today.  Pt states she had some feelings of passing out Thursday and then the headache developed.  Pt c/o some right sided weakness, noted by RN in triage.  Pt c/o photophoba, nausea, dizziness.  Hx of migraines, patient states not similar.

## 2016-09-10 NOTE — ED Notes (Signed)
Pt ambulated independently w/ a steady gait around the nursing station.

## 2016-09-10 NOTE — Discharge Instructions (Signed)
Please read and follow all provided instructions.  Your diagnoses today include:  1. Acute nonintractable headache, unspecified headache type     Tests performed today include:  CT of your head which was normal and did not show any serious cause of your headache  Vital signs. See below for your results today.   Medications:  In the Emergency Department you received:  Reglan - antinausea/headache medication  Benadryl - antihistamine to counteract potential side effects of reglan  Take any prescribed medications only as directed.  Additional information:  Follow any educational materials contained in this packet.  You are having a headache. No specific cause was found today for your headache. It may have been a migraine or other cause of headache. Stress, anxiety, fatigue, and depression are common triggers for headaches.   Your headache today does not appear to be life-threatening or require hospitalization, but often the exact cause of headaches is not determined in the emergency department. Therefore, follow-up with your doctor is very important to find out what may have caused your headache and whether or not you need any further diagnostic testing or treatment.   Sometimes headaches can appear benign (not harmful), but then more serious symptoms can develop which should prompt an immediate re-evaluation by your doctor or the emergency department.  BE VERY CAREFUL not to take multiple medicines containing Tylenol (also called acetaminophen). Doing so can lead to an overdose which can damage your liver and cause liver failure and possibly death.   Follow-up instructions: Please follow-up with your primary care provider in the next 7 days for further evaluation of your symptoms.   Return instructions:   Please return to the Emergency Department if you experience worsening symptoms.  Return if the medications do not resolve your headache, if it recurs, or if you have multiple  episodes of vomiting or cannot keep down fluids.  Return if you have a change from the usual headache.  RETURN IMMEDIATELY IF you:  Develop a sudden, severe headache  Develop confusion or become poorly responsive or faint  Develop a fever above 100.49F or problem breathing  Have a change in speech, vision, swallowing, or understanding  Develop new weakness, numbness, tingling, incoordination in your arms or legs  Have a seizure  Please return if you have any other emergent concerns.  Additional Information:  Your vital signs today were: BP 135/82 (BP Location: Right Arm)    Pulse (!) 103    Temp 98.2 F (36.8 C) (Oral)    Resp 15    Ht  (1.702 m)    Wt 74.4 kg    SpO2 98%    BMI 25.69 kg/m  If your blood pressure (BP) was elevated above 135/85 this visit, please have this repeated by your doctor within one month. --------------

## 2017-02-08 ENCOUNTER — Emergency Department (HOSPITAL_COMMUNITY)
Admission: EM | Admit: 2017-02-08 | Discharge: 2017-02-08 | Disposition: A | Discharge status: Home / Self Care | Payer: BLUE CROSS/BLUE SHIELD | Attending: Emergency Medicine | Admitting: Emergency Medicine

## 2017-02-08 ENCOUNTER — Emergency Department (HOSPITAL_BASED_OUTPATIENT_CLINIC_OR_DEPARTMENT_OTHER)
Admit: 2017-02-08 | Discharge: 2017-02-08 | Disposition: A | Discharge status: Home / Self Care | Payer: BLUE CROSS/BLUE SHIELD | Attending: Emergency Medicine | Admitting: Emergency Medicine

## 2017-02-08 ENCOUNTER — Emergency Department (HOSPITAL_COMMUNITY): Payer: BLUE CROSS/BLUE SHIELD

## 2017-02-08 DIAGNOSIS — I10 Essential (primary) hypertension: Secondary | ICD-10-CM | POA: Diagnosis not present

## 2017-02-08 DIAGNOSIS — M7989 Other specified soft tissue disorders: Secondary | ICD-10-CM

## 2017-02-08 DIAGNOSIS — M79651 Pain in right thigh: Secondary | ICD-10-CM | POA: Diagnosis present

## 2017-02-08 DIAGNOSIS — F17210 Nicotine dependence, cigarettes, uncomplicated: Secondary | ICD-10-CM | POA: Diagnosis not present

## 2017-02-08 DIAGNOSIS — Z79899 Other long term (current) drug therapy: Secondary | ICD-10-CM | POA: Insufficient documentation

## 2017-02-08 DIAGNOSIS — M79604 Pain in right leg: Secondary | ICD-10-CM

## 2017-02-08 MED ORDER — IBUPROFEN 800 MG PO TABS
800.0000 mg | ORAL_TABLET | Freq: Once | ORAL | Status: AC
Start: 2017-02-08 — End: 2017-02-08
  Administered 2017-02-08: 800 mg via ORAL
  Filled 2017-02-08: qty 1

## 2017-02-08 MED ORDER — OXYCODONE-ACETAMINOPHEN 5-325 MG PO TABS
2.0000 | ORAL_TABLET | Freq: Once | ORAL | Status: AC
  Administered 2017-02-08: 2 via ORAL
  Filled 2017-02-08: qty 2

## 2017-02-08 MED ORDER — OXYCODONE-ACETAMINOPHEN 5-325 MG PO TABS
1.0000 | ORAL_TABLET | Freq: Once | ORAL | Status: DC
  Filled 2017-02-08: qty 1

## 2017-02-08 MED ORDER — ONDANSETRON HCL 4 MG PO TABS: 4.0000 mg | ORAL_TABLET | Freq: Three times a day (TID) | ORAL | 0 refills | Status: DC | PRN

## 2017-02-08 MED ORDER — HYDROMORPHONE HCL 1 MG/ML IJ SOLN
1.0000 mg | Freq: Once | INTRAMUSCULAR | Status: AC
  Administered 2017-02-08: 1 mg via INTRAMUSCULAR
  Filled 2017-02-08: qty 1

## 2017-02-08 MED ORDER — ONDANSETRON 4 MG PO TBDP
4.0000 mg | ORAL_TABLET | Freq: Once | ORAL | Status: DC
  Filled 2017-02-08: qty 1

## 2017-02-08 MED ORDER — OXYCODONE-ACETAMINOPHEN 5-325 MG PO TABS: 1.0000 | ORAL_TABLET | Freq: Three times a day (TID) | ORAL | 0 refills | Status: DC | PRN

## 2017-02-08 MED ORDER — LORAZEPAM 0.5 MG PO TABS
0.5000 mg | ORAL_TABLET | Freq: Once | ORAL | Status: AC
  Administered 2017-02-08: 0.5 mg via ORAL
  Filled 2017-02-08: qty 1

## 2017-02-08 MED ORDER — LORAZEPAM 2 MG/ML IJ SOLN: 0.5000 mg | Freq: Once | INTRAMUSCULAR | Status: DC

## 2017-02-08 NOTE — ED Notes (Signed)
Patient transported to MRI 

## 2017-02-08 NOTE — ED Provider Notes (Addendum)
TIME SEEN: 5:55 AM  CHIEF COMPLAINT: Right thigh pain  HPI: Patient is a 46 year old female with history of hypertension, migraines, gout who presents to the emergency department with right thigh pain that started today while at work. She states it is a throbbing pain and then became sharp and severe in nature. Worse with movement but does feel better when she walks around. She denies any injury to the leg but states she has been working out and walking more recently. No history of PE or DVT. No chest pain or shortness of breath. No recent prolonged immobilization such as long flight or hospitalization, surgery, trauma, fracture. She has not noticed any leg swelling. No redness or warmth of the leg. She has tingling in the sole of the right foot but no numbness or focal weakness. No back pain but does have some pain in the posterior right buttock. No bowel or bladder incontinence. Has never had similar symptoms.  ROS: See HPI Constitutional: no fever  Eyes: no drainage  ENT: no runny nose   Cardiovascular:  no chest pain  Resp: no SOB  GI: no vomiting GU: no dysuria Integumentary: no rash  Allergy: no hives  Musculoskeletal: no leg swelling  Neurological: no slurred speech ROS otherwise negative  PAST MEDICAL HISTORY/PAST SURGICAL HISTORY:  Past Medical History:  Diagnosis Date  . Gout   . Hypertension   . Migraines     MEDICATIONS:  Prior to Admission medications   Medication Sig Start Date End Date Taking? Authorizing Provider  aspirin-acetaminophen-caffeine (EXCEDRIN MIGRAINE) [phone] MG tablet Take 2 tablets by mouth every 6 (six) hours as needed for headache or migraine.    [provider]  cyclobenzaprine (FLEXERIL) 10 MG tablet Take 1 tablet (10 mg total) by mouth 2 (two) times daily as needed for muscle spasms. 07/27/16   Dorena Bodo, NP  diclofenac (VOLTAREN) 75 MG EC tablet Take 1 tablet (75 mg total) by mouth 2 (two) times daily. 07/27/16   Dorena Bodo,  NP  ibuprofen (ADVIL,MOTRIN) 200 MG tablet Take 200 mg by mouth every 6 (six) hours as needed.    [provider]  predniSONE (DELTASONE) 50 MG tablet Take 1 tablet (50 mg total) by mouth daily. 07/28/15   Tharon Aquas, PA  propranolol (INDERAL) 40 MG tablet Take 40 mg by mouth 2 (two) times daily.    [provider]    ALLERGIES:  Allergies  Allergen Reactions  . Xanax [Alprazolam] Itching    SOCIAL HISTORY:  Social History  Substance Use Topics  . Smoking status: Current Every Day Smoker    Packs/day: 0.10    Types: Cigarettes  . Smokeless tobacco: Never Used  . Alcohol use Yes     Comment: occasional    FAMILY HISTORY: No family history on file.  EXAM: Ht  (1.702 m)   Wt 74.8 kg (165 lb)   BMI 25.84 kg/m  CONSTITUTIONAL: Alert and oriented and responds appropriately to questions. Appears uncomfortable but is nontoxic HEAD: Normocephalic EYES: Conjunctivae clear, pupils appear equal, EOMI ENT: normal nose; moist mucous membranes NECK: Supple, no meningismus, no nuchal rigidity, no LAD  CARD: RRR; S1 and S2 appreciated; no murmurs, no clicks, no rubs, no gallops RESP: Normal chest excursion without splinting or tachypnea; breath sounds clear and equal bilaterally; no wheezes, no rhonchi, no rales, no hypoxia or respiratory distress, speaking full sentences ABD/GI: Normal bowel sounds; non-distended; soft, non-tender, no rebound, no guarding, no peritoneal signs, no hepatosplenomegaly BACK:  The back appears normal and is mildly tender over the lower lumbar spine without step-off or deformity, there is no CVA tenderness EXT: Tender to palpation over the posterior right hip and in the buttocks without swelling, ecchymosis, warmth or erythema. She is tender throughout the posterior and anterior right thigh without obvious swelling. There is no ecchymosis, erythema or warmth. The joint effusion noted. She has full range of motion in her hip, knee, ankle  and toes on the right side. All of her extremities are warm and well-perfused and she has 2+ palpable DP pulses bilaterally. She reports feeling tingling in the bottom of the right foot but there is no numbness. There is no saddle anesthesia. No bony deformity. No joint effusion. Compartments are soft. Normal ROM in all joints; otherwise extremities are non-tender to palpation; no edema; normal capillary refill; no cyanosis, no calf tenderness or swelling    SKIN: Normal color for age and race; warm; no rash NEURO: Moves all extremities equally, ambulates with a limp, sensation to light touch intact diffusely, normal speech, no facial asymmetry, no saddle anesthesia PSYCH: The patient's mood and manner are appropriate. Grooming and personal hygiene are appropriate.  MEDICAL DECISION MAKING: Patient here with right leg pain. She has tingling in the bottom of her right foot is otherwise neurologically and vascularly intact. There is no sign of septic arthritis, cellulitis, gout or history of injury. Doubt fracture. She does have some bony tenderness on exam and therefore will obtain x-rays of the hip and lumbar spine. Suspect this could be sciatica but given her anterior thigh pain also concerned for possible DVT. Will obtain venous Doppler. Will give her pain medication and reassess.  ED PROGRESS: Pt's x-ray show no acute abnormality other than mild L5-S1 discogenic degenerative changes. This could be sciatica based on these images. Significant pain control after Percocet. We'll give IM Dilaudid. Venous Doppler pending. Signed out to oncoming ED physician.  8:00 AM  Pt having increasing numbness throughout the entire right leg. We'll obtain an MRI of her lumbar spine without contrast for further evaluation. No T-spine pain on exam. No saddle anesthesia. No incontinence. No urinary retention. No fevers. No history of back surgery, epidural injection. No history of HIV, diabetes, steroid use. Doubt  infection.   I reviewed all nursing notes, vitals, pertinent previous records, EKGs, lab and urine results, imaging (as available).      Heru Montz, Layla MawKristen N, DO 02/08/17 0710    Taleeya Blondin, Layla MawKristen N, DO 02/08/17 [phone]

## 2017-02-08 NOTE — ED Provider Notes (Signed)
12:17 PM Assumed care from Dr. Elesa MassedWard, please see their note for full history, physical and decision making until this point. In brief this is a 46 y.o. year old female who presented to the ED tonight with Leg Pain     46 year old female here with left leg pain unlikely that she's had before. Plan to evaluate with a ultrasound and MRI. If all okay patient can be discharged. She'll need pain medication.  DVT study and MRI's negative for acute condition. Still with some pain and nausea but no vomiting. No obvious cellulitis or other acute cause for symptoms.   Discharge instructions, including strict return precautions for new or worsening symptoms, given. Patient and/or family verbalized understanding and agreement with the plan as described.   Labs, studies and imaging reviewed by myself and considered in medical decision making if ordered. Imaging interpreted by radiology.  Labs Reviewed - No data to display  MR FEMUR LEFT WO CONTRAST  Final Result    MR LUMBAR SPINE WO CONTRAST  Final Result    DG Lumbar Spine Complete  Final Result    DG Hip Unilat W or Wo Pelvis 2-3 Views Right  Final Result      No Follow-up on file.    Marily MemosMesner, Ikeisha Blumberg, MD 02/08/17 [id number]

## 2017-02-08 NOTE — Progress Notes (Signed)
VASCULAR LAB PRELIMINARY  PRELIMINARY  PRELIMINARY  PRELIMINARY  Right lower extremity venous duplex completed.    Preliminary report:  There is no DVT or SVT noted in the right lower extremity.    Called results to Dr. Phebe CollaMessner  Warrick Llera, [facility], RVT 02/08/2017, 8:43 AM

## 2017-02-08 NOTE — ED Triage Notes (Signed)
Pt from home c/o R leg pain. Sts pain started yesterday after work. Denies any injury. Rates pain @ 10/10. Sts took ibuprofen w/ no relief. Denies incontinence or any back pain. No visible swelling/injury noted to site. A&O x4 on arrival.

## 2017-02-08 NOTE — Discharge Instructions (Addendum)
To find a primary care or specialty doctor please call [phone] or [phone] to access "[facility] Find a Doctor Service." ° °You may also go on the [place] website at [email or web address] ° °There are also multiple [facility], [facility] and [facility] practices throughout the [place] that are frequently accepting new patients. You may find a clinic that is close to your home and contact them. ° °[facility] -  °[address] °[place] [address] °[phone] ° ° °[facility] -  °[address] °[place] [address] [phone] ° ° °[facility] - °[address]  °[place] [address] °[phone] ° ° °

## 2017-03-16 ENCOUNTER — Ambulatory Visit (HOSPITAL_COMMUNITY)
Admission: EM | Admit: 2017-03-16 | Discharge: 2017-03-16 | Disposition: A | Discharge status: Home / Self Care | Payer: BLUE CROSS/BLUE SHIELD | Attending: Internal Medicine | Admitting: Internal Medicine

## 2017-03-16 ENCOUNTER — Emergency Department (HOSPITAL_COMMUNITY)
Admission: EM | Admit: 2017-03-16 | Discharge: 2017-03-16 | Disposition: A | Discharge status: Home / Self Care | Payer: BLUE CROSS/BLUE SHIELD | Attending: Emergency Medicine | Admitting: Emergency Medicine

## 2017-03-16 ENCOUNTER — Encounter (HOSPITAL_COMMUNITY): Payer: Self-pay

## 2017-03-16 ENCOUNTER — Encounter (HOSPITAL_COMMUNITY): Payer: Self-pay | Admitting: Physician Assistant

## 2017-03-16 DIAGNOSIS — M25552 Pain in left hip: Secondary | ICD-10-CM

## 2017-03-16 DIAGNOSIS — M79605 Pain in left leg: Secondary | ICD-10-CM | POA: Diagnosis present

## 2017-03-16 DIAGNOSIS — Z5321 Procedure and treatment not carried out due to patient leaving prior to being seen by health care provider: Secondary | ICD-10-CM | POA: Diagnosis not present

## 2017-03-16 MED ORDER — DEXAMETHASONE 10 MG/ML FOR PEDIATRIC ORAL USE
INTRAMUSCULAR | Status: AC
  Filled 2017-03-16: qty 1

## 2017-03-16 MED ORDER — KETOROLAC TROMETHAMINE 60 MG/2ML IM SOLN
INTRAMUSCULAR | Status: AC
  Filled 2017-03-16: qty 2

## 2017-03-16 MED ORDER — MELOXICAM 15 MG PO TABS: 15.0000 mg | ORAL_TABLET | Freq: Every day | ORAL | 0 refills | Status: DC

## 2017-03-16 MED ORDER — DEXAMETHASONE SODIUM PHOSPHATE 10 MG/ML IJ SOLN
10.0000 mg | Freq: Once | INTRAMUSCULAR | Status: AC
  Administered 2017-03-16: 10 mg via INTRAMUSCULAR

## 2017-03-16 MED ORDER — KETOROLAC TROMETHAMINE 60 MG/2ML IM SOLN
60.0000 mg | Freq: Once | INTRAMUSCULAR | Status: AC
  Administered 2017-03-16: 60 mg via INTRAMUSCULAR

## 2017-03-16 NOTE — Discharge Instructions (Signed)
Toradol and decadron injection in office today. Start Mobic as directed. Ice compress and elevation as needed. Please follow up with orthopedics for further evaluation and treatment needed if symptoms worsens, or does not improve.

## 2017-03-16 NOTE — ED Provider Notes (Signed)
[facility]    CSN: [id number] Arrival date & time: 03/16/17  1154     History   Chief Complaint Chief Complaint  Patient presents with  . Leg Pain    HPI [name] is a 46 y.o. female.   46 year old female with history of gout, hypertension, migraines comes in for a few day history of left hip pain. She states she was recently seen in the emergency department for similar symptoms on her right hip, at that time was treated for sciatica. At that time, they did x-rays and MRI, showing degenerative disc disease of the lumbar spine. She works as a Financial traderhouse cleaner, with lots of physical activity. Constant pain that is exacerbated by weightbearing and walking. Pain is at the anterior aspect of the hip, and can radiate to the groin area. She's tried Excedrin, naproxen with little relief. Has done ice compresses without relief. Denies numbness/tingling, injury, saddle anesthesia, loss of bladder or bowel control.      Past Medical History:  Diagnosis Date  . Gout   . Hypertension   . Migraines     There are no active problems to display for this patient.   Past Surgical History:  Procedure Laterality Date  . ABDOMINAL HYSTERECTOMY  2008    OB History    No data available       Home Medications    Prior to Admission medications   Medication Sig Start Date End Date Taking? Authorizing Provider  meloxicam (MOBIC) 15 MG tablet Take 1 tablet (15 mg total) by mouth daily. 03/16/17   Katherine FisherYu, Amberlea Terry V, PA-C    Family History No family history on file.  Social History Social History  Substance Use Topics  . Smoking status: Current Every Day Smoker    Packs/day: 0.10    Types: Cigarettes  . Smokeless tobacco: Never Used  . Alcohol use Yes     Comment: occasional     Allergies   Xanax [alprazolam]   Review of Systems Review of Systems   Physical Exam Triage Vital Signs ED Triage Vitals  Enc Vitals Group     BP 03/16/17 1234 (!) 146/68   Pulse Rate 03/16/17 1234 70     Resp 03/16/17 1234 20     Temp 03/16/17 1234 97.9 F (36.6 C)     Temp Source 03/16/17 1234 Oral     SpO2 03/16/17 1234 100 %     Weight --      Height --      Head Circumference --      Peak Flow --      Pain Score 03/16/17 1235 10     Pain Loc --      Pain Edu? --      Excl. in GC? --    No data found.   Updated Vital Signs BP (!) 146/68 (BP Location: Left Arm)   Pulse 70   Temp 97.9 F (36.6 C) (Oral)   Resp 20   SpO2 100%   Physical Exam  Constitutional: She is oriented to person, place, and time. She appears well-developed and well-nourished. No distress.  HENT:  Head: Normocephalic and atraumatic.  Eyes: Pupils are equal, round, and reactive to light. Conjunctivae are normal.  Cardiovascular: Normal rate, regular rhythm and normal heart sounds.  Exam reveals no gallop and no friction rub.   No murmur heard. Pulmonary/Chest: Effort normal and breath sounds normal. She has no wheezes. She has no rales.  Musculoskeletal:  Painful  limp to the exam table.  No obvious swelling noted. No tenderness on palpation of spinous processes, bilateral back. No tenderness on palpation of posterior hip. Tenderness on palpation of anterior and lateral hip. Decreased ROM due to pain, states internal/external rotation with most pain. Sensation intact. Strength deferred.   Neurological: She is alert and oriented to person, place, and time.     UC Treatments / Results  Labs (all labs ordered are listed, but only abnormal results are displayed) Labs Reviewed - No data to display  EKG  EKG Interpretation None       Radiology No results found.  Procedures Procedures (including critical care time)  Medications Ordered in UC Medications  ketorolac (TORADOL) injection 60 mg (60 mg Intramuscular Given 03/16/17 1350)  dexamethasone (DECADRON) injection 10 mg (10 mg Intramuscular Given 03/16/17 1350)     Initial Impression / Assessment and Plan  / UC Course  I have reviewed the triage vital signs and the nursing notes.  Pertinent labs & imaging results that were available during my care of the patient were reviewed by me and considered in my medical decision making (see chart for details).    Discussed inflammation/bursitis/arthritis as possible causes of symptoms. Toradol and decadron injection in office. Mobic as directed. Ice compress and elevation. Patient to follow up with orthopedics if symptoms do not improve or worsens.   Final Clinical Impressions(s) / UC Diagnoses   Final diagnoses:  Acute hip pain, left    New Prescriptions New Prescriptions   MELOXICAM (MOBIC) 15 MG TABLET    Take 1 tablet (15 mg total) by mouth daily.      Katherine Fisher, PA-C 03/16/17 1358

## 2017-03-16 NOTE — ED Triage Notes (Signed)
Pt. Developed lt. Leg pain a few weeks ago .  The pain is constant but the intensity is severe at times.  Pt. Was at work yesterday and the pain was so severe that she was in tears.  The pain radiates around her upper lt. Thigh that radiates into her groin area.  She denies any vaignal pain , bleeding or discharge.  Lt. Leg  Las no swelling or deformity noted.  She denies any injuries.  She had this pain in Her rt. Posterior upper thigh a few months ago.  Was diagnosed with sciatica  Pt. Is alert and oriented X4

## 2017-03-16 NOTE — ED Triage Notes (Signed)
Pt here for left leg pain/groin pain onset x1 month ++   Denies inj/trauma.... [facility] to [facility]  A&O x4... NAD... Ambulatory

## 2017-08-03 ENCOUNTER — Emergency Department (HOSPITAL_COMMUNITY)
Admission: EM | Admit: 2017-08-03 | Discharge: 2017-08-03 | Disposition: A | Discharge status: Home / Self Care | Payer: 59 | Attending: Emergency Medicine | Admitting: Emergency Medicine

## 2017-08-03 ENCOUNTER — Encounter (HOSPITAL_COMMUNITY): Payer: Self-pay | Admitting: Emergency Medicine

## 2017-08-03 DIAGNOSIS — F17210 Nicotine dependence, cigarettes, uncomplicated: Secondary | ICD-10-CM | POA: Insufficient documentation

## 2017-08-03 DIAGNOSIS — I10 Essential (primary) hypertension: Secondary | ICD-10-CM | POA: Insufficient documentation

## 2017-08-03 DIAGNOSIS — Z79899 Other long term (current) drug therapy: Secondary | ICD-10-CM | POA: Insufficient documentation

## 2017-08-03 DIAGNOSIS — R519 Headache, unspecified: Secondary | ICD-10-CM

## 2017-08-03 DIAGNOSIS — R51 Headache: Secondary | ICD-10-CM | POA: Insufficient documentation

## 2017-08-03 DIAGNOSIS — R110 Nausea: Secondary | ICD-10-CM | POA: Insufficient documentation

## 2017-08-03 LAB — URINALYSIS, ROUTINE W REFLEX MICROSCOPIC
BACTERIA UA: NONE SEEN
BILIRUBIN URINE: NEGATIVE
Glucose, UA: NEGATIVE mg/dL
Hgb urine dipstick: NEGATIVE
Ketones, ur: NEGATIVE mg/dL
NITRITE: NEGATIVE
PH: 7 (ref 5.0–8.0)
Protein, ur: NEGATIVE mg/dL
SPECIFIC GRAVITY, URINE: 1.004 — AB (ref 1.005–1.030)

## 2017-08-03 LAB — CBC
HEMATOCRIT: 39.5 % (ref 36.0–46.0)
Hemoglobin: 12.7 g/dL (ref 12.0–15.0)
MCH: 28.5 pg (ref 26.0–34.0)
MCHC: 32.2 g/dL (ref 30.0–36.0)
MCV: 88.6 fL (ref 78.0–100.0)
Platelets: 352 10*3/uL (ref 150–400)
RBC: 4.46 MIL/uL (ref 3.87–5.11)
RDW: 12.8 % (ref 11.5–15.5)
WBC: 9.6 10*3/uL (ref 4.0–10.5)

## 2017-08-03 LAB — BASIC METABOLIC PANEL
Anion gap: 11 (ref 5–15)
BUN: 12 mg/dL (ref 6–20)
CO2: 22 mmol/L (ref 22–32)
Calcium: 9.4 mg/dL (ref 8.9–10.3)
Chloride: 107 mmol/L (ref 101–111)
Creatinine, Ser: 0.9 mg/dL (ref 0.44–1.00)
GFR calc Af Amer: >60 mL/min (ref >60)
GLUCOSE: 101 mg/dL — AB (ref 65–99)
POTASSIUM: 3.7 mmol/L (ref 3.5–5.1)
Sodium: 140 mmol/L (ref 135–145)

## 2017-08-03 LAB — I-STAT TROPONIN, ED: TROPONIN I, POC: 0 ng/mL (ref 0.00–0.08)

## 2017-08-03 LAB — I-STAT BETA HCG BLOOD, ED (MC, WL, AP ONLY): I-stat hCG, quantitative: <5 m[IU]/mL (ref <5)

## 2017-08-03 MED ORDER — METOCLOPRAMIDE HCL 5 MG/ML IJ SOLN
10.0000 mg | Freq: Once | INTRAMUSCULAR | Status: AC
  Administered 2017-08-03: 10 mg via INTRAVENOUS
  Filled 2017-08-03: qty 2

## 2017-08-03 MED ORDER — AMLODIPINE BESYLATE 5 MG PO TABS: 5.0000 mg | ORAL_TABLET | Freq: Every day | ORAL | 0 refills | Status: DC

## 2017-08-03 MED ORDER — DIPHENHYDRAMINE HCL 50 MG/ML IJ SOLN
25.0000 mg | Freq: Once | INTRAMUSCULAR | Status: AC
  Administered 2017-08-03: 25 mg via INTRAVENOUS
  Filled 2017-08-03: qty 1

## 2017-08-03 MED ORDER — AMLODIPINE BESYLATE 5 MG PO TABS
5.0000 mg | ORAL_TABLET | Freq: Once | ORAL | Status: AC
  Administered 2017-08-03: 5 mg via ORAL
  Filled 2017-08-03: qty 1

## 2017-08-03 MED ORDER — SODIUM CHLORIDE 0.9 % IV BOLUS (SEPSIS)
1000.0000 mL | Freq: Once | INTRAVENOUS | Status: AC
  Administered 2017-08-03: 1000 mL via INTRAVENOUS

## 2017-08-03 NOTE — ED Provider Notes (Signed)
[facility] EMERGENCY DEPARTMENT Provider Note   CSN: [id number] Arrival date & time: 08/03/17  2150     History   Chief Complaint Chief Complaint  Patient presents with  . Hypertension    HPI [name] is a 47 y.o. female.  The history is provided by the patient and medical records.  Hypertension  Associated symptoms include headaches.    47 year old female with history of gout, hypertension, migraine headaches, presenting to the ED with elevated blood pressure.  Reports for the past 2 weeks she has been experiencing daily headaches with some lightheadedness.  States she has also had some intermittent blurred vision, initially thought it was because she needed glasses as she was having some difficulty focusing when trying to read paperwork or use the computer at work.  States today at work she got nauseated and had someone check her blood pressure and noted that it was high.  She continues to have headaches that dull, generalized and aching in nature.  States she does have history of migraine headaches and this actually feels more like a migraine then headaches she used to get from her blood pressure.  She was on Inderal approximately 2 years ago but it never controlled her BP and the last time she saw her doctor her BP seemed ok so they told her she could stop her medication.  She has never been on any other BP medications.  She is not on anticoagulation at this time.  Patient is very hypertensive here, 205/113.  Past Medical History:  Diagnosis Date  . Gout   . Hypertension   . Migraines     There are no active problems to display for this patient.   Past Surgical History:  Procedure Laterality Date  . ABDOMINAL HYSTERECTOMY  2008    OB History    No data available       Home Medications    Prior to Admission medications   Medication Sig Start Date End Date Taking? Authorizing Provider  aspirin-acetaminophen-caffeine (EXCEDRIN MIGRAINE)  [phone] MG tablet Take 2 tablets by mouth every 6 (six) hours as needed for headache.   Yes [provider]  ibuprofen (ADVIL,MOTRIN) 200 MG tablet Take 800 mg by mouth every 6 (six) hours as needed.   Yes [provider]  naproxen sodium (ALEVE) 220 MG tablet Take 440 mg by mouth 2 (two) times daily as needed.   Yes [provider]  meloxicam (MOBIC) 15 MG tablet Take 1 tablet (15 mg total) by mouth daily. Patient not taking: Reported on 08/03/2017 03/16/17   Belinda Fisher, PA-C  Ibuprofen-Diphenhydramine Cit (ADVIL PM PO) Take by mouth.  01/09/14  [provider]  SUMAtriptan (IMITREX) 50 MG tablet Take 1 tablet (50 mg total) by mouth every 2 (two) hours as needed for migraine. May repeat in 2 hours if headache persists or recurs. 03/26/13 01/09/14  Reuben Likes, MD    Family History No family history on file.  Social History Social History   Tobacco Use  . Smoking status: Current Every Day Smoker    Packs/day: 0.10    Types: Cigarettes  . Smokeless tobacco: Never Used  Substance Use Topics  . Alcohol use: Yes    Comment: occasional  . Drug use: No     Allergies   Xanax [alprazolam]   Review of Systems Review of Systems  Neurological: Positive for headaches.  All other systems reviewed and are negative.    Physical Exam Updated  Vital Signs BP (!) 205/113 (BP Location: Right Arm)   Pulse 62   Temp 98.3 F (36.8 C) (Oral)   Resp 12   Ht 5\' 7"  (1.702 m)   Wt 74.8 kg (165 lb)   SpO2 100%   BMI 25.84 kg/m   Physical Exam  Constitutional: She is oriented to person, place, and time. She appears well-developed and well-nourished. No distress.  HENT:  Head: Normocephalic and atraumatic.  Right Ear: External ear normal.  Left Ear: External ear normal.  Mouth/Throat: Oropharynx is clear and moist.  Eyes: Conjunctivae and EOM are normal. Pupils are equal, round, and reactive to light.  Neck: Normal range of motion and full passive  range of motion without pain. Neck supple. No neck rigidity.  No rigidity, no meningismus  Cardiovascular: Normal rate, regular rhythm and normal heart sounds.  No murmur heard. Pulmonary/Chest: Effort normal and breath sounds normal. No respiratory distress. She has no wheezes. She has no rhonchi.  Abdominal: Soft. Bowel sounds are normal. There is no tenderness. There is no guarding.  Musculoskeletal: Normal range of motion. She exhibits no edema.  Neurological: She is alert and oriented to person, place, and time. She has normal strength. She displays no tremor. No cranial nerve deficit or sensory deficit. She displays no seizure activity.  AAOx3, answering questions and following commands appropriately; equal strength UE and LE bilaterally; CN grossly intact; moves all extremities appropriately without ataxia; no focal neuro deficits or facial asymmetry appreciated  Skin: Skin is warm and dry. No rash noted. She is not diaphoretic.  Psychiatric: She has a normal mood and affect. Her behavior is normal. Thought content normal.  Nursing note and vitals reviewed.    ED Treatments / Results  Labs (all labs ordered are listed, but only abnormal results are displayed) Labs Reviewed  BASIC METABOLIC PANEL - Abnormal; Notable for the following components:      Result Value   Glucose, Bld 101 (*)    All other components within normal limits  URINALYSIS, ROUTINE W REFLEX MICROSCOPIC - Abnormal; Notable for the following components:   Color, Urine COLORLESS (*)    Specific Gravity, Urine 1.004 (*)    Leukocytes, UA SMALL (*)    Squamous Epithelial / LPF 0-5 (*)    All other components within normal limits  CBC  I-STAT BETA HCG BLOOD, ED ([facility], [facility], [facility] ONLY)  I-STAT TROPONIN, ED    EKG  EKG Interpretation None       Radiology No results found.  Procedures Procedures (including critical care time)  Medications Ordered in ED Medications  amLODipine (NORVASC) tablet 5 mg (5 mg Oral  Given 08/03/17 2237)  sodium chloride 0.9 % bolus 1,000 mL (1,000 mLs Intravenous New Bag/Given 08/03/17 2244)  diphenhydrAMINE (BENADRYL) injection 25 mg (25 mg Intravenous Given 08/03/17 2239)  metoCLOPramide (REGLAN) injection 10 mg (10 mg Intravenous Given 08/03/17 2238)     Initial Impression / Assessment and Plan / ED Course  I have reviewed the triage vital signs and the nursing notes.  Pertinent labs & imaging results that were available during my care of the patient were reviewed by me and considered in my medical decision making (see chart for details).  47 year old female here with elevated blood pressure.  Has been having ongoing headaches for about 2 weeks now, new nausea today.  Also has history of migraine headaches.  She is awake, alert, appropriately oriented.  She is significantly hypertensive here, has not been on medications in  about 2 years.  She is neurologically intact.  Screening labs have been sent.  We will plan for migraine cocktail in addition to dose of blood pressure medicine.  Headaches likely secondary to her elevated BP.  11:14 PM Patient's BP has improved significantly after medications.  Now 157/100.  Her labs are reassuring, no evidence of end organ damage.  States she feels significantly better. Feel she is stable for d/c.  Will start her on home norvasc as she responded well to it here.  She understands to follow-up closely with her PCP for ongoing BP management.  Discussed plan with patient, she acknowledged understanding and agreed with plan of care.  Return precautions given for new or worsening symptoms.  Final Clinical Impressions(s) / ED Diagnoses   Final diagnoses:  Essential hypertension  Persistent headaches    ED Discharge Orders        Ordered    amLODipine (NORVASC) 5 MG tablet  Daily     08/03/17 2316       Garlon Hatchet, PA-C 08/04/17 0000    Alvira Monday, MD 08/04/17 1308

## 2017-08-03 NOTE — ED Triage Notes (Signed)
Pt BIB [facility] with c/o hypertension, dizziness, and nausea x 2 weeks. Given 4mg  zofran PTA. Hx HTN, noncompliant with medications. EMS BP 218/104

## 2017-08-03 NOTE — Discharge Instructions (Signed)
Take the prescribed medication as directed. Follow-up with your primary care doctor as you will need ongoing management of your blood pressure. Return to the ED for new or worsening symptoms--new chest pain, shortness of breath, worsening headaches, dizziness, confusion, etc.

## 2017-08-20 ENCOUNTER — Ambulatory Visit (HOSPITAL_COMMUNITY)
Admission: EM | Admit: 2017-08-20 | Discharge: 2017-08-20 | Disposition: A | Discharge status: Home / Self Care | Payer: Self-pay | Attending: Family Medicine | Admitting: Family Medicine

## 2017-08-20 ENCOUNTER — Encounter (HOSPITAL_COMMUNITY): Payer: Self-pay | Admitting: Family Medicine

## 2017-08-20 DIAGNOSIS — J029 Acute pharyngitis, unspecified: Secondary | ICD-10-CM | POA: Insufficient documentation

## 2017-08-20 DIAGNOSIS — G43909 Migraine, unspecified, not intractable, without status migrainosus: Secondary | ICD-10-CM

## 2017-08-20 LAB — POCT RAPID STREP A: Streptococcus, Group A Screen (Direct): NEGATIVE

## 2017-08-20 MED ORDER — KETOROLAC TROMETHAMINE 60 MG/2ML IM SOLN
60.0000 mg | Freq: Once | INTRAMUSCULAR | Status: AC
  Administered 2017-08-20: 60 mg via INTRAMUSCULAR

## 2017-08-20 MED ORDER — KETOROLAC TROMETHAMINE 60 MG/2ML IM SOLN
INTRAMUSCULAR | Status: AC
  Filled 2017-08-20: qty 2

## 2017-08-20 MED ORDER — DEXAMETHASONE SODIUM PHOSPHATE 10 MG/ML IJ SOLN
INTRAMUSCULAR | Status: AC
  Filled 2017-08-20: qty 1

## 2017-08-20 MED ORDER — DEXAMETHASONE SODIUM PHOSPHATE 10 MG/ML IJ SOLN
10.0000 mg | Freq: Once | INTRAMUSCULAR | Status: AC
  Administered 2017-08-20: 10 mg via INTRAMUSCULAR

## 2017-08-20 NOTE — ED Provider Notes (Signed)
[facility]    CSN: [id number] Arrival date & time: 08/20/17  1417     History   Chief Complaint Chief Complaint  Patient presents with  . Headache  . Sore Throat    HPI [name] is a 47 y.o. female.   [name] presents with complaints of headache which has been ongoing for the past 3 weeks. States she went to the ER and was found to have htn, started on medication which she has been taking, which has not helped with her headache. This was 3/8. Pain to left eye and left side of head. 10/10 at times. States feels similar to previous migraines she has had but is lasting longer. Has been trying Excedrin, ibuprofen, aleve, tylenol and bc which have not helped. Has only taken tylenol today. Without nausea or vomiting. No head injury. without dizziness. Sound, movement and noise exacerbate it. Hx htn, migraines, gout.  Also with sore throat which started yesterday. Pain with swallowing. No known fevers. Without other URI symtpoms.   ROS per HPI.      Past Medical History:  Diagnosis Date  . Gout   . Hypertension   . Migraines     There are no active problems to display for this patient.   Past Surgical History:  Procedure Laterality Date  . ABDOMINAL HYSTERECTOMY  2008    OB History   None      Home Medications    Prior to Admission medications   Medication Sig Start Date End Date Taking? Authorizing Provider  amLODipine (NORVASC) 5 MG tablet Take 1 tablet (5 mg total) by mouth daily. 08/03/17   Garlon Hatchet, PA-C  aspirin-acetaminophen-caffeine (EXCEDRIN MIGRAINE) [phone] MG tablet Take 2 tablets by mouth every 6 (six) hours as needed for headache.    [provider]  ibuprofen (ADVIL,MOTRIN) 200 MG tablet Take 800 mg by mouth every 6 (six) hours as needed.    [provider]  meloxicam (MOBIC) 15 MG tablet Take 1 tablet (15 mg total) by mouth daily. Patient not taking: Reported on 08/03/2017 03/16/17   Belinda Fisher, PA-C    naproxen sodium (ALEVE) 220 MG tablet Take 440 mg by mouth 2 (two) times daily as needed.    [provider]    Family History History reviewed. No pertinent family history.  Social History Social History   Tobacco Use  . Smoking status: Current Every Day Smoker    Packs/day: 0.10    Types: Cigarettes  . Smokeless tobacco: Never Used  Substance Use Topics  . Alcohol use: Yes    Comment: occasional  . Drug use: No     Allergies   Xanax [alprazolam]   Review of Systems Review of Systems   Physical Exam Triage Vital Signs ED Triage Vitals  Enc Vitals Group     BP 08/20/17 1500 126/72     Pulse Rate 08/20/17 1500 (!) 104     Resp 08/20/17 1500 18     Temp 08/20/17 1500 98.7 F (37.1 C)     Temp src --      SpO2 08/20/17 1500 98 %     Weight --      Height --      Head Circumference --      Peak Flow --      Pain Score 08/20/17 1459 10     Pain Loc --      Pain Edu? --      Excl. in [facility]? --  No data found.  Updated Vital Signs BP 126/72   Pulse (!) 104   Temp 98.7 F (37.1 C)   Resp 18   SpO2 98%   Visual Acuity Right Eye Distance:   Left Eye Distance:   Bilateral Distance:    Right Eye Near:   Left Eye Near:    Bilateral Near:     Physical Exam  Constitutional: She is oriented to person, place, and time. She appears well-developed and well-nourished. No distress.  HENT:  Mouth/Throat: Oropharynx is clear and moist.  Eyes: Pupils are equal, round, and reactive to light. EOM are normal.  Neck: Normal range of motion. No neck rigidity.  Cardiovascular: Normal rate, regular rhythm and normal heart sounds.  Pulmonary/Chest: Effort normal and breath sounds normal.  Neurological: She is alert and oriented to person, place, and time. She has normal strength. She is not disoriented. No cranial nerve deficit or sensory deficit. Coordination normal. GCS eye subscore is 4. GCS verbal subscore is 5. GCS motor subscore is 6.  Skin: Skin is warm  and dry.  Psychiatric: She has a normal mood and affect.     UC Treatments / Results  Labs (all labs ordered are listed, but only abnormal results are displayed) Labs Reviewed  CULTURE, GROUP A STREP [facility]  POCT RAPID STREP A    EKG None Radiology No results found.  Procedures Procedures (including critical care time)  Medications Ordered in UC Medications  ketorolac (TORADOL) injection 60 mg (60 mg Intramuscular Given 08/20/17 1623)  dexamethasone (DECADRON) injection 10 mg (10 mg Intramuscular Given 08/20/17 1623)     Initial Impression / Assessment and Plan / UC Course  I have reviewed the triage vital signs and the nursing notes.  Pertinent labs & imaging results that were available during my care of the patient were reviewed by me and considered in my medical decision making (see chart for details).     Negative rapid strep. toradol and decadron provided in clinic today. Without neurological findings on exam. Patient left clinic after medications prior to reassessment as had another engagement. No discharge instructions provided.   Final Clinical Impressions(s) / UC Diagnoses   Final diagnoses:  Migraine without status migrainosus, not intractable, unspecified migraine type    ED Discharge Orders    None       Controlled Substance Prescriptions [place] Controlled Substance Registry consulted? Not Applicable   Georgetta HaberBurky, Natalie B, NP 08/20/17 1645

## 2017-08-20 NOTE — ED Triage Notes (Signed)
Pt here for headache and sore throat. She also had headaches 2 weeks ago and BP was elevated and was seen in the ER. The headache hasnt gotten better and she is taking BP meds currently. Taking tylenol, Excedrin, BC for pain and not helping.

## 2017-08-23 ENCOUNTER — Ambulatory Visit: Payer: Self-pay | Admitting: Family Medicine

## 2017-08-23 LAB — CULTURE, GROUP A STREP (THRC)

## 2017-11-25 ENCOUNTER — Other Ambulatory Visit: Payer: Self-pay

## 2017-11-25 ENCOUNTER — Emergency Department (HOSPITAL_COMMUNITY)
Admission: EM | Admit: 2017-11-25 | Discharge: 2017-11-25 | Disposition: A | Discharge status: Home / Self Care | Payer: Self-pay | Attending: Emergency Medicine | Admitting: Emergency Medicine

## 2017-11-25 ENCOUNTER — Encounter (HOSPITAL_COMMUNITY): Payer: Self-pay | Admitting: Emergency Medicine

## 2017-11-25 DIAGNOSIS — H1032 Unspecified acute conjunctivitis, left eye: Secondary | ICD-10-CM | POA: Insufficient documentation

## 2017-11-25 DIAGNOSIS — I10 Essential (primary) hypertension: Secondary | ICD-10-CM | POA: Insufficient documentation

## 2017-11-25 DIAGNOSIS — Z79899 Other long term (current) drug therapy: Secondary | ICD-10-CM | POA: Insufficient documentation

## 2017-11-25 DIAGNOSIS — F17210 Nicotine dependence, cigarettes, uncomplicated: Secondary | ICD-10-CM | POA: Insufficient documentation

## 2017-11-25 MED ORDER — ERYTHROMYCIN 5 MG/GM OP OINT: TOPICAL_OINTMENT | OPHTHALMIC | 0 refills | Status: DC

## 2017-11-25 MED ORDER — ERYTHROMYCIN 5 MG/GM OP OINT
1.0000 "application " | TOPICAL_OINTMENT | Freq: Once | OPHTHALMIC | Status: AC
  Administered 2017-11-25: 1 via OPHTHALMIC
  Filled 2017-11-25: qty 3.5

## 2017-11-25 MED ORDER — TETRACAINE HCL 0.5 % OP SOLN
2.0000 [drp] | Freq: Once | OPHTHALMIC | Status: AC
  Administered 2017-11-25: 2 [drp] via OPHTHALMIC
  Filled 2017-11-25: qty 4

## 2017-11-25 MED ORDER — FLUORESCEIN SODIUM 1 MG OP STRP
1.0000 | ORAL_STRIP | Freq: Once | OPHTHALMIC | Status: AC
  Administered 2017-11-25: 1 via OPHTHALMIC
  Filled 2017-11-25: qty 1

## 2017-11-25 NOTE — ED Triage Notes (Addendum)
Pt states 2 days of left sided eye redness with drainage. States she cannot work like this. Pt has pain to the eye. Taking otc for pain with relief.

## 2017-11-25 NOTE — ED Provider Notes (Signed)
[facility] EMERGENCY DEPARTMENT Provider Note   CSN: [id number] Arrival date & time: 11/25/17  4098     History   Chief Complaint Chief Complaint  Patient presents with  . Eye Problem    HPI [name] is a 47 y.o. female with past medical history of hypertension who presents emergency department today for left eye redness over the last 2 days.  Patient notes that 2 days ago she awoke with a red eye with associated tearing.  When she woke up the next morning she noted that is crusted shut.  Since that time she has been having watery discharge as well as irritation to the eye.  She notes that at times it feels like her vision is blurry from the discharge but when she wipes it away her vision improves.  She has been taking over-the-counter eyedrops for symptoms without any relief.  She denies history of same.  She is unsure of sick contacts.  She states nothing makes her symptoms better or worse.  Denies fever, HA, N/V, loss of vision, flashers, floaters,  diplopia, photophobia, FB sensation, redness, discharge, trauma, rash, pain or painful EOM.  Patient is not a contact lens wearer.  HPI  Past Medical History:  Diagnosis Date  . Gout   . Hypertension   . Migraines     There are no active problems to display for this patient.   Past Surgical History:  Procedure Laterality Date  . ABDOMINAL HYSTERECTOMY  2008     OB History   None      Home Medications    Prior to Admission medications   Medication Sig Start Date End Date Taking? Authorizing Provider  amLODipine (NORVASC) 5 MG tablet Take 1 tablet (5 mg total) by mouth daily. 08/03/17   Garlon Hatchet, PA-C  aspirin-acetaminophen-caffeine (EXCEDRIN MIGRAINE) [phone] MG tablet Take 2 tablets by mouth every 6 (six) hours as needed for headache.    [provider]  ibuprofen (ADVIL,MOTRIN) 200 MG tablet Take 800 mg by mouth every 6 (six) hours as needed.    [provider]    meloxicam (MOBIC) 15 MG tablet Take 1 tablet (15 mg total) by mouth daily. Patient not taking: Reported on 08/03/2017 03/16/17   Belinda Fisher, PA-C  naproxen sodium (ALEVE) 220 MG tablet Take 440 mg by mouth 2 (two) times daily as needed.    [provider]    Family History No family history on file.  Social History Social History   Tobacco Use  . Smoking status: Current Every Day Smoker    Packs/day: 0.10    Types: Cigarettes  . Smokeless tobacco: Never Used  Substance Use Topics  . Alcohol use: Yes    Comment: occasional  . Drug use: No     Allergies   Xanax [alprazolam]   Review of Systems Review of Systems  All other systems reviewed and are negative.    Physical Exam Updated Vital Signs BP (!) 142/102 (BP Location: Right Arm)   Pulse 97   Temp 99.4 F (37.4 C) (Oral)   Resp 17   Ht 5\' 7"  (1.702 m)   Wt 74.8 kg (165 lb)   SpO2 98%   BMI 25.84 kg/m   Physical Exam  Constitutional: She appears well-developed and well-nourished.  HENT:  Head: Normocephalic and atraumatic.  Right Ear: External ear normal.  Left Ear: External ear normal.  Nose: Nose normal.  Mouth/Throat: Uvula is midline, oropharynx is clear and  moist and mucous membranes are normal. No tonsillar exudate.  Eyes: Pupils are equal, round, and reactive to light. Right eye exhibits no discharge. Left eye exhibits discharge. Left conjunctiva is injected. Left conjunctiva has no hemorrhage. No scleral icterus. Left eye exhibits normal extraocular motion and no nystagmus.  Appearance (Left) Conjunctival and scleral icterus that is limbic sparing. No scleral icterus. Watery discharge ntoed. PEERL intact. EOMI without nystagmus or pain. No photophobia or consensual photophobia.  Corneal Abrasion Exam VCO. Risks, benefits and alternatives explained. 1 drops of tetracaine (PONTOCAINE) 0.5 % ophthalmic solution were applied to the left eye. Fluorescein 1 MG ophthalmic strip applied the the  surface of the left eye Wood's lamp used to screen for abrasion. No increased fluorescein uptake. No corneal ulcer. Negative Seidel sign. No foreign bodies noted. No visible hyphema.  Eye flushed with sterile saline Patient tolerated the procedure well TONOPEN: 20.95 LEFT No periorbital swelling, erythema or edema.  Neck: Trachea normal. Neck supple. No spinous process tenderness present. No neck rigidity. Normal range of motion present.  Cardiovascular: Normal rate, regular rhythm and intact distal pulses.  No murmur heard. Pulses:      Radial pulses are 2+ on the right side, and 2+ on the left side.       Dorsalis pedis pulses are 2+ on the right side, and 2+ on the left side.       Posterior tibial pulses are 2+ on the right side, and 2+ on the left side.  No lower extremity swelling or edema. Calves symmetric in size bilaterally.  Pulmonary/Chest: Effort normal and breath sounds normal. She exhibits no tenderness.  Abdominal: Soft. Bowel sounds are normal. There is no tenderness. There is no rebound and no guarding.  Musculoskeletal: She exhibits no edema.  Lymphadenopathy:    She has no cervical adenopathy.  Neurological: She is alert.  Skin: Skin is warm and dry. No rash noted. Rash is not vesicular. She is not diaphoretic.  No vesicular-like rash in the V1 distribution  Psychiatric: She has a normal mood and affect.  Nursing note and vitals reviewed.    ED Treatments / Results  Labs (all labs ordered are listed, but only abnormal results are displayed) Labs Reviewed - No data to display  EKG None  Radiology No results found.  Procedures Procedures (including critical care time)  Medications Ordered in ED Medications  erythromycin ophthalmic ointment 1 application (has no administration in time range)  fluorescein ophthalmic strip 1 strip (1 strip Both Eyes Given 11/25/17 0744)  tetracaine (PONTOCAINE) 0.5 % ophthalmic solution 2 drop (2 drops Both Eyes Given 11/25/17  0745)     Initial Impression / Assessment and Plan / ED Course  I have reviewed the triage vital signs and the nursing notes.  Pertinent labs & imaging results that were available during my care of the patient were reviewed by me and considered in my medical decision making (see chart for details).     47 y.o. female presenting with red eye. Patient presentation consistent with conjunctivitis.  No corneal abrasions, entrapment, consensual photophobia, or dendritic staining with fluorescein study.  Presentation non-concerning for iritis, foreign body, corneal abrasions, or HSV.  Erythromycin ointment ordered.  Personal hygiene and frequent handwashing discussed.  Patient advised to followup with ophthalmologist if symptoms worsen or in any way including vision change or eye pain.  Strict return precautions discussed.  Patient verbalizes understanding and is agreeable with discharge.  Final Clinical Impressions(s) / ED Diagnoses   Final  diagnoses:  Acute bacterial conjunctivitis of left eye    ED Discharge Orders    None       Princella Pellegrini 11/25/17 3086    Shaune Pollack, MD 11/25/17 [phone]

## 2017-11-25 NOTE — Discharge Instructions (Addendum)
Please read and follow all provided instructions.  Your diagnosis today includes: Conjunctivitis  Use erythromycin ointment given in the department every 6 hours for the next 7 days to the left eye.   Listed in your discharge instructions is an ophthalmologist (eye doctor) to schedule a follow up appointment with.  Follow-up care is necessary to be sure the infection is healing if not completely resolved in 2-3 days. See your caregiver or eye specialist as suggested for follow-up. Read the instructions below and make sure you understand reasons to return to the Emergency Department.   Tests performed today include: Visual acuity testing to check your vision Fluorescein dye examination to look for scratches on your eye Tonometry to check the pressure inside of your eye Vital signs. See below for your results today.    Conjunctivitis  Conjunctivitis is commonly called "pink eye." Conjunctivitis can be caused by bacterial or viral infection, allergies, or injuries. There is usually redness of the lining of the eye, itching, discomfort, and sometimes discharge. Pink eye is very contagious and spreads by direct contact. Please avoid spreading this to other persons. Do not rub your eye. This increases the irritation and helps spread infection. Use separate towels from other household members. Wash your hands with soap and water before and after touching your eyes. You may be given antibiotic eyedrops as part of your treatment. Before using your eye medicine, remove all drainage from the eye by washing gently with warm water and cotton balls. Continue to use the medication until you have awakened 2 mornings in a row without discharge from the eye or as directed on the drops instructions. Use cold compresses to reduce pain and sunglasses to relieve irritation from light. Do not wear contact lenses or wear eye makeup until the infection is gone.  SEEK MEDICAL CARE IF:  Your symptoms are not better after 3  days of treatment.  You have increased pain or trouble seeing.  The outer eyelids become very red or swollen.  You develop double vision or your vision becomes blurred or worsens in any way.  You have trouble moving your eyes.  The eye looks like it is popping out (this is called proptosis).  You develop a severe headache, severe neck pain, or neck stiffness.  You develop repeated vomiting.  You have a fever (>100.26F) or persistent symptoms for more than 72 hours.  You have a fever (>100.26F) and your symptoms suddenly get worse.  Additional Information:  Your vital signs today were: BP (!) 142/102 (BP Location: Right Arm)    Pulse 97    Temp 99.4 F (37.4 C) (Oral)    Resp 17    Ht 5\' 7"  (1.702 m)    Wt 74.8 kg (165 lb)    SpO2 98%    BMI 25.84 kg/m  If your blood pressure (BP) was elevated above 135/85 this visit, please have this repeated by your doctor within one month. ---------------

## 2017-12-01 DIAGNOSIS — H1033 Unspecified acute conjunctivitis, bilateral: Secondary | ICD-10-CM | POA: Diagnosis not present

## 2018-01-21 DIAGNOSIS — H04123 Dry eye syndrome of bilateral lacrimal glands: Secondary | ICD-10-CM | POA: Diagnosis not present

## 2018-01-21 DIAGNOSIS — H35033 Hypertensive retinopathy, bilateral: Secondary | ICD-10-CM | POA: Diagnosis not present

## 2018-03-22 ENCOUNTER — Emergency Department (HOSPITAL_COMMUNITY)
Admission: EM | Admit: 2018-03-22 | Discharge: 2018-03-22 | Disposition: A | Discharge status: Home / Self Care | Payer: Self-pay | Attending: Emergency Medicine | Admitting: Emergency Medicine

## 2018-03-22 ENCOUNTER — Encounter (HOSPITAL_COMMUNITY): Payer: Self-pay

## 2018-03-22 ENCOUNTER — Other Ambulatory Visit: Payer: Self-pay

## 2018-03-22 DIAGNOSIS — Z79899 Other long term (current) drug therapy: Secondary | ICD-10-CM | POA: Insufficient documentation

## 2018-03-22 DIAGNOSIS — G43009 Migraine without aura, not intractable, without status migrainosus: Secondary | ICD-10-CM

## 2018-03-22 DIAGNOSIS — I10 Essential (primary) hypertension: Secondary | ICD-10-CM | POA: Insufficient documentation

## 2018-03-22 DIAGNOSIS — G43909 Migraine, unspecified, not intractable, without status migrainosus: Secondary | ICD-10-CM | POA: Insufficient documentation

## 2018-03-22 DIAGNOSIS — F17210 Nicotine dependence, cigarettes, uncomplicated: Secondary | ICD-10-CM | POA: Insufficient documentation

## 2018-03-22 DIAGNOSIS — Z7982 Long term (current) use of aspirin: Secondary | ICD-10-CM | POA: Insufficient documentation

## 2018-03-22 MED ORDER — AMLODIPINE BESYLATE 10 MG PO TABS: 10.0000 mg | ORAL_TABLET | Freq: Every day | ORAL | 0 refills | Status: DC

## 2018-03-22 MED ORDER — KETOROLAC TROMETHAMINE 60 MG/2ML IM SOLN
60.0000 mg | Freq: Once | INTRAMUSCULAR | Status: AC
  Administered 2018-03-22: 60 mg via INTRAMUSCULAR
  Filled 2018-03-22: qty 2

## 2018-03-22 MED ORDER — ACETAMINOPHEN 500 MG PO TABS
1000.0000 mg | ORAL_TABLET | Freq: Once | ORAL | Status: AC
  Administered 2018-03-22: 1000 mg via ORAL
  Filled 2018-03-22: qty 2

## 2018-03-22 MED ORDER — ONDANSETRON 4 MG PO TBDP
4.0000 mg | ORAL_TABLET | Freq: Once | ORAL | Status: AC
  Administered 2018-03-22: 4 mg via ORAL
  Filled 2018-03-22: qty 1

## 2018-03-22 MED ORDER — METOCLOPRAMIDE HCL 10 MG PO TABS: 10.0000 mg | ORAL_TABLET | Freq: Four times a day (QID) | ORAL | 0 refills | Status: DC | PRN

## 2018-03-22 NOTE — ED Triage Notes (Signed)
Pt presents for evaluation of headache worse on L side of face/head x 2 weeks. Pt reports associated L ear pain. States has HTN, today 142/100. Has been out of her BP medications. Pt is ambulatory, AxO x4, no focal neuro deficits.

## 2018-03-22 NOTE — ED Provider Notes (Signed)
[facility] EMERGENCY DEPARTMENT Provider Note   CSN: [id number] Arrival date & time: 03/22/18  1112     History   Chief Complaint Chief Complaint  Patient presents with  . Headache    HPI [name] is a 47 y.o. female.  Patient with history of migraine headaches, high blood pressure, presents with migraine-like headache for the past 10 days.  Currently located left face and ear region.  Patient is also been out of her blood pressure medications and is trying to obtain insurance/primary care follow-up.  Patient denies any stroke symptoms.  Gradual onset headache.  No head injury.  Patient prefers nonsedating medications as she has to drive home.     Past Medical History:  Diagnosis Date  . Gout   . Hypertension   . Migraines     There are no active problems to display for this patient.   Past Surgical History:  Procedure Laterality Date  . ABDOMINAL HYSTERECTOMY  2008     OB History   None      Home Medications    Prior to Admission medications   Medication Sig Start Date End Date Taking? Authorizing Provider  amLODipine (NORVASC) 10 MG tablet Take 1 tablet (10 mg total) by mouth daily. 03/22/18   Blane Ohara, MD  aspirin-acetaminophen-caffeine (EXCEDRIN MIGRAINE) 5021053805 MG tablet Take 2 tablets by mouth every 6 (six) hours as needed for headache.    [provider]  erythromycin ophthalmic ointment Place a 1/2 inch ribbon of ointment into the lower eyelid every 6 hours for the next 7 days. 11/25/17   Maczis, Elmer Sow, PA-C  ibuprofen (ADVIL,MOTRIN) 200 MG tablet Take 800 mg by mouth every 6 (six) hours as needed.    [provider]  meloxicam (MOBIC) 15 MG tablet Take 1 tablet (15 mg total) by mouth daily. Patient not taking: Reported on 08/03/2017 03/16/17   Belinda Fisher, PA-C  metoCLOPramide (REGLAN) 10 MG tablet Take 1 tablet (10 mg total) by mouth every 6 (six) hours as needed for nausea (nausea/headache).  03/22/18   Blane Ohara, MD  naproxen sodium (ALEVE) 220 MG tablet Take 440 mg by mouth 2 (two) times daily as needed.    [provider]    Family History No family history on file.  Social History Social History   Tobacco Use  . Smoking status: Current Every Day Smoker    Packs/day: 0.10    Types: Cigarettes  . Smokeless tobacco: Never Used  Substance Use Topics  . Alcohol use: Yes    Comment: occasional  . Drug use: No     Allergies   Xanax [alprazolam]   Review of Systems Review of Systems  Constitutional: Negative for chills and fever.  HENT: Negative for congestion.   Eyes: Negative for visual disturbance.  Respiratory: Negative for shortness of breath.   Cardiovascular: Negative for chest pain.  Gastrointestinal: Negative for abdominal pain and vomiting.  Genitourinary: Negative for dysuria and flank pain.  Musculoskeletal: Negative for back pain, neck pain and neck stiffness.  Skin: Negative for rash.  Neurological: Positive for light-headedness and headaches. Negative for weakness.     Physical Exam Updated Vital Signs BP (!) 162/95 (BP Location: Left Arm)   Pulse 94   Temp 98.3 F (36.8 C) (Oral)   Resp 18   SpO2 95%   Physical Exam  Constitutional: She is oriented to person, place, and time. She appears well-developed and well-nourished.  HENT:  Head: Normocephalic  and atraumatic.  Eyes: Conjunctivae are normal. Right eye exhibits no discharge. Left eye exhibits no discharge.  Neck: Normal range of motion. Neck supple. No tracheal deviation present.  Cardiovascular: Normal rate.  Pulmonary/Chest: Effort normal.  Abdominal: Soft. She exhibits no distension. There is no tenderness. There is no guarding.  Musculoskeletal: She exhibits no edema.  Neurological: She is alert and oriented to person, place, and time. GCS eye subscore is 4. GCS verbal subscore is 5. GCS motor subscore is 6.  5+ strength in UE and LE with f/e at major  joints. Sensation to palpation intact in UE and LE. CNs 2-12 grossly intact.  EOMFI.  PERRL.   Finger nose and coordination intact bilateral.   No nystagmus   Skin: Skin is warm. No rash noted.  Psychiatric: She has a normal mood and affect.  Nursing note and vitals reviewed.    ED Treatments / Results  Labs (all labs ordered are listed, but only abnormal results are displayed) Labs Reviewed - No data to display  EKG None  Radiology No results found.  Procedures Procedures (including critical care time)  Medications Ordered in ED Medications  ketorolac (TORADOL) injection 60 mg (has no administration in time range)  acetaminophen (TYLENOL) tablet 1,000 mg (has no administration in time range)  ondansetron (ZOFRAN-ODT) disintegrating tablet 4 mg (has no administration in time range)     Initial Impression / Assessment and Plan / ED Course  I have reviewed the triage vital signs and the nursing notes.  Pertinent labs & imaging results that were available during my care of the patient were reviewed by me and considered in my medical decision making (see chart for details).    Patient presents with 2 separate medical concerns.  Patient has elevated blood pressure and has been out of her medications amlodipine.  Discussed refill and follow-up with primary doctor once her insurance is settled. Patient has gradual onset migraine-like headache, pain meds given in ER and outpatient prescription.  Patient stable for outpatient follow-up.  Final Clinical Impressions(s) / ED Diagnoses   Final diagnoses:  Migraine without aura and without status migrainosus, not intractable  Essential hypertension    ED Discharge Orders         Ordered    metoCLOPramide (REGLAN) 10 MG tablet  Every 6 hours PRN     03/22/18 1520    amLODipine (NORVASC) 10 MG tablet  Daily     03/22/18 1521           Blane Ohara, MD 03/22/18 [phone]

## 2018-03-22 NOTE — ED Notes (Signed)
EDP at bedside  

## 2018-03-22 NOTE — Discharge Instructions (Signed)
Take benadryl with reglan for migraine like headaches. Fill blood pressure prescriptions.  Follow up with primary doctor.  If you were given medicines take as directed.  If you are on coumadin or contraceptives realize their levels and effectiveness is altered by many different medicines.  If you have any reaction (rash, tongues swelling, other) to the medicines stop taking and see a physician.    If your blood pressure was elevated in the ER make sure you follow up for management with a primary doctor or return for chest pain, shortness of breath or stroke symptoms.  Please follow up as directed and return to the ER or see a physician for new or worsening symptoms.  Thank you. Vitals:   03/22/18 1121 03/22/18 1446  BP: (!) 142/100 (!) 162/95  Pulse: 91 94  Resp: 16 18  Temp: 98.3 F (36.8 C)   TempSrc: Oral   SpO2: 100% 95%

## 2018-05-14 ENCOUNTER — Encounter: Payer: Self-pay | Admitting: Family

## 2018-05-14 ENCOUNTER — Encounter (INDEPENDENT_AMBULATORY_CARE_PROVIDER_SITE_OTHER): Payer: Self-pay

## 2018-05-14 ENCOUNTER — Ambulatory Visit (INDEPENDENT_AMBULATORY_CARE_PROVIDER_SITE_OTHER): Payer: BLUE CROSS/BLUE SHIELD | Admitting: Family

## 2018-05-14 ENCOUNTER — Ambulatory Visit (INDEPENDENT_AMBULATORY_CARE_PROVIDER_SITE_OTHER)
Admission: RE | Admit: 2018-05-14 | Discharge: 2018-05-14 | Disposition: A | Discharge status: Home / Self Care | Payer: BLUE CROSS/BLUE SHIELD | Source: Ambulatory Visit | Attending: Family | Admitting: Family

## 2018-05-14 VITALS — BP 142/98 | HR 93 | Temp 98.1°F | Ht 67.0 in | Wt 167.1 lb

## 2018-05-14 DIAGNOSIS — M542 Cervicalgia: Secondary | ICD-10-CM | POA: Diagnosis not present

## 2018-05-14 DIAGNOSIS — G43909 Migraine, unspecified, not intractable, without status migrainosus: Secondary | ICD-10-CM | POA: Insufficient documentation

## 2018-05-14 DIAGNOSIS — G43809 Other migraine, not intractable, without status migrainosus: Secondary | ICD-10-CM

## 2018-05-14 DIAGNOSIS — Z720 Tobacco use: Secondary | ICD-10-CM | POA: Diagnosis not present

## 2018-05-14 DIAGNOSIS — I10 Essential (primary) hypertension: Secondary | ICD-10-CM | POA: Insufficient documentation

## 2018-05-14 DIAGNOSIS — G8929 Other chronic pain: Secondary | ICD-10-CM

## 2018-05-14 DIAGNOSIS — M25561 Pain in right knee: Secondary | ICD-10-CM

## 2018-05-14 DIAGNOSIS — M109 Gout, unspecified: Secondary | ICD-10-CM

## 2018-05-14 DIAGNOSIS — R0681 Apnea, not elsewhere classified: Secondary | ICD-10-CM | POA: Diagnosis not present

## 2018-05-14 DIAGNOSIS — Z1231 Encounter for screening mammogram for malignant neoplasm of breast: Secondary | ICD-10-CM

## 2018-05-14 DIAGNOSIS — G43919 Migraine, unspecified, intractable, without status migrainosus: Secondary | ICD-10-CM | POA: Insufficient documentation

## 2018-05-14 MED ORDER — OLMESARTAN MEDOXOMIL 20 MG PO TABS: 20.0000 mg | ORAL_TABLET | Freq: Every day | ORAL | 3 refills | Status: DC

## 2018-05-14 MED ORDER — AMLODIPINE BESYLATE 10 MG PO TABS: 10.0000 mg | ORAL_TABLET | Freq: Every day | ORAL | 0 refills | Status: DC

## 2018-05-14 NOTE — Progress Notes (Signed)
[name] is a 47 y.o. female with the following history as recorded in EpicCare:  Patient Active Problem List   Diagnosis Date Noted  . Essential hypertension 05/14/2018  . Tobacco abuse 05/14/2018  . Migraine 05/14/2018  . Gout 05/14/2018    Current Outpatient Medications  Medication Sig Dispense Refill  . amLODipine (NORVASC) 10 MG tablet Take 1 tablet (10 mg total) by mouth daily. 90 tablet 0  . aspirin-acetaminophen-caffeine (EXCEDRIN MIGRAINE) 250-250-65 MG tablet Take 2 tablets by mouth every 6 (six) hours as needed for headache.    . ibuprofen (ADVIL,MOTRIN) 200 MG tablet Take 800 mg by mouth every 6 (six) hours as needed.    . naproxen sodium (ALEVE) 220 MG tablet Take 440 mg by mouth 2 (two) times daily as needed.    Marland Kitchen. olmesartan (BENICAR) 20 MG tablet Take 1 tablet (20 mg total) by mouth daily. 30 tablet 3   No current facility-administered medications for this visit.     Allergies: Xanax [alprazolam]  Past Medical History:  Diagnosis Date  . Gout   . Hypertension   . Migraines     Past Surgical History:  Procedure Laterality Date  . ABDOMINAL HYSTERECTOMY  2008    History reviewed. No pertinent family history.  Social History   Tobacco Use  . Smoking status: Current Every Day Smoker    Packs/day: 0.10    Types: Cigarettes  . Smokeless tobacco: Never Used  Substance Use Topics  . Alcohol use: Yes    Comment: occasional    Subjective:  Patient presents today as a new patient; has been getting majority of her healthcare needs managed through ER; has history of hypertension/ on Amlodipine 10 mg daily- states she does take her medication daily; she does not check her blood pressure regularly;  History of migraine headaches- may get headache every 3 months; has light sensitivity/ sound sensitivity; typically requires IV medication to treat her migraines;   Does often feel like she wakes up choking/ not being able to breathe; she denies being a snorer;  does feel like she can stop breathing at night.  Smokes 2 cigarettes/ day- admits she is considering quitting smoking; knows she can quit- just "have to make up my mind."  Quit drinking recently- had been drinking 12 beers/ day; quit in 2018;  History of gout- seems to flare when eating seafood; does not remember if uric acid level ever checked;   Also states that right knee "gives out" on her frequently; would be interested in seeing sports medicine  Has never had mammogram; s/p total hysterectomy- was told she did not need to continue Pap smears; hysterectomy done for uterine fibroids;    Objective:  Vitals:   05/14/18 0900  BP: (!) 142/98  Pulse: 93  Temp: 98.1 F (36.7 C)  TempSrc: Oral  SpO2: 98%  Weight: 167 lb 1.9 oz (75.8 kg)  Height: 5\' 7"  (1.702 m)    General: Well developed, well nourished, in no acute distress  Skin : Warm and dry.  Head: Normocephalic and atraumatic  Eyes: Sclera and conjunctiva clear; pupils round and reactive to light; extraocular movements intact  Ears: External normal; canals clear; tympanic membranes normal  Oropharynx: Pink, supple. No suspicious lesions  Neck: Supple without thyromegaly, adenopathy  Lungs: Respirations unlabored; clear to auscultation bilaterally without wheeze, rales, rhonchi  CVS exam: normal rate and regular rhythm.  Abdomen: Soft; nontender; nondistended; normoactive bowel sounds; no masses or hepatosplenomegaly  Neurologic: Alert and oriented; speech  intact; face symmetrical; moves all extremities well; CNII-XII intact without focal deficit   Assessment:  1. Essential hypertension   2. Screening mammogram, encounter for   3. Witnessed episode of apnea   4. Tobacco abuse   5. Other migraine without status migrainosus, not intractable   6. Neck pain   7. Chronic pain of right knee   8. Gout, unspecified cause, unspecified chronicity, unspecified site     Plan:  1. Uncontrolled; continue Amlodipine 10 mg daily;  add Benciar 20 mg qd; follow-up in 1 month; plan to update labs at that time. 2. Order for mammogram updated; 3. Schedule for sleep study; 4. Stressed need to quit smoking completely; 5. May need to update imaging/ ? If headaches will lessen once blood pressure better controlled; am hesitant to give Triptan at this time; 6. Update cervical X-ray; 7. Plan to follow-up with Dr. Katrinka Blazing to discuss options- may need injections; 8. Plan to check uric acid with labs next month.  Return in about 1 month (around 06/14/2018) for with me and Dr. Katrinka Blazing on same day.  Orders Placed This Encounter  Procedures  . MM Digital Screening    Standing Status:   Future    Standing Expiration Date:   07/16/2019    Order Specific Question:   Reason for Exam (SYMPTOM  OR DIAGNOSIS REQUIRED)    Answer:   screening mammogram    Order Specific Question:   Is the patient pregnant?    Answer:   No    Order Specific Question:   Preferred imaging location?    Answer:   [facility]  . DG Cervical Spine Complete    Standing Status:   Future    Number of Occurrences:   1    Standing Expiration Date:   07/16/2019    Order Specific Question:   Reason for Exam (SYMPTOM  OR DIAGNOSIS REQUIRED)    Answer:   neck pain    Order Specific Question:   Is patient pregnant?    Answer:   No    Order Specific Question:   Preferred imaging location?    Answer:   [facility]    Order Specific Question:   Radiology Contrast Protocol - do NOT remove file path    Answer:   \\[email or web address]  . Ambulatory referral to Neurology    Referral Priority:   Routine    Referral Type:   Consultation    Referral Reason:   Specialty Services Required    Requested Specialty:   Neurology    Number of Visits Requested:   1    Requested Prescriptions   Signed Prescriptions Disp Refills  . olmesartan (BENICAR) 20 MG tablet 30 tablet 3    Sig: Take 1 tablet (20 mg total) by mouth daily.  Marland Kitchen  amLODipine (NORVASC) 10 MG tablet 90 tablet 0    Sig: Take 1 tablet (10 mg total) by mouth daily.

## 2018-05-20 ENCOUNTER — Ambulatory Visit: Payer: Self-pay

## 2018-05-20 NOTE — Telephone Encounter (Signed)
Pt called stating that she has a migraine headache since yesterday. She states that she has been taking Excedrin Migraine but it has not taken her HA away. She states its a 10 now. She denies weakness or numbness on one side.  She states the last time she was at the office the doctor put her on an additional BP medication. She has some cold symptoms but states the headache came before the cold symptoms and her eyes hurt. Pt does have a Hx of migraines. Appointment scheduled per protocol.  Care advice read to patient. Pt verbalized understanding of all instructions.  Reason for Disposition . [1] MODERATE headache (e.g., interferes with normal activities) AND [2] present > 24 hours AND [3] unexplained  (Exceptions: analgesics not tried, typical migraine, or headache part of viral illness)  Answer Assessment - Initial Assessment Questions 1. LOCATION: "Where does it hurt?"      All over 2. ONSET: "When did the headache start?" (Minutes, hours or days)      yesterday 3. PATTERN: "Does the pain come and go, or has it been constant since it started?"     continuous 4. SEVERITY: "How bad is the pain?" and "What does it keep you from doing?"  (e.g., Scale 1-10; mild, moderate, or severe)   - MILD (1-3): doesn't interfere with normal activities    - MODERATE (4-7): interferes with normal activities or awakens from sleep    - SEVERE (8-10): excruciating pain, unable to do any normal activities        10 5. RECURRENT SYMPTOM: "Have you ever had headaches before?" If so, ask: "When was the last time?" and "What happened that time?"      Yes every 3-6 months 6. CAUSE: "What do you think is causing the headache?"     Not sure new BP medication started on the 17th of Dec. 7. MIGRAINE: "Have you been diagnosed with migraine headaches?" If so, ask: "Is this headache similar?"      yes 8. HEAD INJURY: "Has there been any recent injury to the head?"    no 9. OTHER SYMPTOMS: "Do you have any other symptoms?"  (fever, stiff neck, eye pain, sore throat, cold symptoms)     Cold symptoms eyes hurt  10. PREGNANCY: "Is there any chance you are pregnant?" "When was your last menstrual period?"       No HYsterectomy  Protocols used: HEADACHE-A-AH

## 2018-05-21 ENCOUNTER — Ambulatory Visit (INDEPENDENT_AMBULATORY_CARE_PROVIDER_SITE_OTHER): Payer: BLUE CROSS/BLUE SHIELD | Admitting: Family

## 2018-05-21 ENCOUNTER — Encounter: Payer: Self-pay | Admitting: Family

## 2018-05-21 DIAGNOSIS — R51 Headache: Secondary | ICD-10-CM | POA: Diagnosis not present

## 2018-05-21 DIAGNOSIS — G8929 Other chronic pain: Secondary | ICD-10-CM

## 2018-05-21 MED ORDER — KETOROLAC TROMETHAMINE 60 MG/2ML IM SOLN
60.0000 mg | Freq: Once | INTRAMUSCULAR | Status: AC
  Administered 2018-05-21: 60 mg via INTRAMUSCULAR

## 2018-05-21 MED ORDER — TOPIRAMATE 25 MG PO TABS: ORAL_TABLET | ORAL | 1 refills | Status: DC

## 2018-05-21 NOTE — Progress Notes (Signed)
[name] is a 47 y.o. female with the following history as recorded in EpicCare:  Patient Active Problem List   Diagnosis Date Noted  . Essential hypertension 05/14/2018  . Tobacco abuse 05/14/2018  . Migraine 05/14/2018  . Gout 05/14/2018    Current Outpatient Medications  Medication Sig Dispense Refill  . amLODipine (NORVASC) 10 MG tablet Take 1 tablet (10 mg total) by mouth daily. 90 tablet 0  . aspirin-acetaminophen-caffeine (EXCEDRIN MIGRAINE) 250-250-65 MG tablet Take 2 tablets by mouth every 6 (six) hours as needed for headache.    . ibuprofen (ADVIL,MOTRIN) 200 MG tablet Take 800 mg by mouth every 6 (six) hours as needed.    . naproxen sodium (ALEVE) 220 MG tablet Take 440 mg by mouth 2 (two) times daily as needed.    Marland Kitchen. olmesartan (BENICAR) 20 MG tablet Take 1 tablet (20 mg total) by mouth daily. 30 tablet 3  . topiramate (TOPAMAX) 25 MG tablet One tablet at night x 1 week; then increase to bid; 60 tablet 1   No current facility-administered medications for this visit.     Allergies: Xanax [alprazolam]  Past Medical History:  Diagnosis Date  . Gout   . Hypertension   . Migraines     Past Surgical History:  Procedure Laterality Date  . ABDOMINAL HYSTERECTOMY  2008    History reviewed. No pertinent family history.  Social History   Tobacco Use  . Smoking status: Current Every Day Smoker    Packs/day: 0.10    Types: Cigarettes  . Smokeless tobacco: Never Used  Substance Use Topics  . Alcohol use: Yes    Comment: occasional    Subjective:  4 day history of migraine headache;  Prone to migraine headaches- normally has to go to the ER for Toradol injections; In reviewing migraine history, may have a "headache" 2 x per week; may have a "severe migraine" every 3 months; notes these symptoms have been present x 25+ years;    Objective:  Vitals:   05/21/18 1059  BP: 138/80  Pulse: 98  Temp: 98.4 F (36.9 C)  TempSrc: Oral  SpO2: 99%  Weight: 170 lb  12.8 oz (77.5 kg)  Height: 5\' 7"  (1.702 m)    General: Well developed, well nourished, in no acute distress;; prefers room to be darkened; Skin : Warm and dry.  Head: Normocephalic and atraumatic  Eyes: Sclera and conjunctiva clear; pupils round and reactive to light; extraocular movements intact  Ears: External normal; canals clear; tympanic membranes normal  Oropharynx: Pink, supple. No suspicious lesions  Neck: Supple without thyromegaly, adenopathy  Lungs: Respirations unlabored; clear to auscultation bilaterally without wheeze, rales, rhonchi  CVS exam: normal rate, regular rhythm, normal S1, S2, no murmurs, rubs, clicks or gallops, normal rate and regular rhythm.  Vessels: Symmetric bilaterally  Neurologic: Alert and oriented; speech intact; face symmetrical; moves all extremities well; CNII-XII intact without focal deficit   Assessment:  1. Chronic nonintractable headache, unspecified headache type     Plan:  Update brain MRI; Toradol IM 60 mg given today; will start Topamax 25 mg bid; keep planned follow-up for January 2020;    No follow-ups on file.  Orders Placed This Encounter  Procedures  . MR Brain Wo Contrast    Standing Status:   Future    Standing Expiration Date:   07/23/2019    Order Specific Question:   What is the patient's sedation requirement?    Answer:   No Sedation  Order Specific Question:   Does the patient have a pacemaker or implanted devices?    Answer:   No    Order Specific Question:   Preferred imaging location?    Answer:   [facility] (table limit-550lbs)    Order Specific Question:   Radiology Contrast Protocol - do NOT remove file path    Answer:   \\[email or web address]    Requested Prescriptions   Signed Prescriptions Disp Refills  . topiramate (TOPAMAX) 25 MG tablet 60 tablet 1    Sig: One tablet at night x 1 week; then increase to bid;

## 2018-06-01 ENCOUNTER — Other Ambulatory Visit: Payer: BLUE CROSS/BLUE SHIELD

## 2018-06-02 NOTE — Progress Notes (Deleted)
[name] [address] [place], [place] [address] Phone: [phone] Subjective:    I'm seeing this patient by the request  of:    CC: knee pain, neck pain  VPX:[id number]  [name] is a 48 y.o. female coming in with complaint of ***  Onset-  Location Duration-  Character- Aggravating factors- Reliving factors-  Therapies tried-  Severity-   Patient did have x-rays of the neck taken on May 14, 2017.  Found to have mild degenerative disc disease on C5-C6.  Past Medical History:  Diagnosis Date  . Gout   . Hypertension   . Migraines    Past Surgical History:  Procedure Laterality Date  . ABDOMINAL HYSTERECTOMY  2008   Social History   Socioeconomic History  . Marital status: Single    Spouse name: Not on file  . Number of children: Not on file  . Years of education: Not on file  . Highest education level: Not on file  Occupational History  . Not on file  Social Needs  . Financial resource strain: Not on file  . Food insecurity:    Worry: Not on file    Inability: Not on file  . Transportation needs:    Medical: Not on file    Non-medical: Not on file  Tobacco Use  . Smoking status: Current Every Day Smoker    Packs/day: 0.10    Types: Cigarettes  . Smokeless tobacco: Never Used  Substance and Sexual Activity  . Alcohol use: Yes    Comment: occasional  . Drug use: No  . Sexual activity: Not on file  Lifestyle  . Physical activity:    Days per week: Not on file    Minutes per session: Not on file  . Stress: Not on file  Relationships  . Social connections:    Talks on phone: Not on file    Gets together: Not on file    Attends religious service: Not on file    Active member of club or organization: Not on file    Attends meetings of clubs or organizations: Not on file    Relationship status: Not on file  Other Topics Concern  . Not on file  Social History Narrative  . Not on file   Allergies    Allergen Reactions  . Xanax [Alprazolam] Itching   No family history on file.   Current Outpatient Medications (Cardiovascular):  .  amLODipine (NORVASC) 10 MG tablet, Take 1 tablet (10 mg total) by mouth daily. Marland Kitchen  olmesartan (BENICAR) 20 MG tablet, Take 1 tablet (20 mg total) by mouth daily.   Current Outpatient Medications (Analgesics):  .  aspirin-acetaminophen-caffeine (EXCEDRIN MIGRAINE) 250-250-65 MG tablet, Take 2 tablets by mouth every 6 (six) hours as needed for headache. .  ibuprofen (ADVIL,MOTRIN) 200 MG tablet, Take 800 mg by mouth every 6 (six) hours as needed. .  naproxen sodium (ALEVE) 220 MG tablet, Take 440 mg by mouth 2 (two) times daily as needed.   Current Outpatient Medications (Other):  .  topiramate (TOPAMAX) 25 MG tablet, One tablet at night x 1 week; then increase to bid;    Past medical history, social, surgical and family history all reviewed in electronic medical record.  No pertanent information unless stated regarding to the chief complaint.   Review of Systems:  No headache, visual changes, nausea, vomiting, diarrhea, constipation, dizziness, abdominal pain, skin rash, fevers, chills, night sweats, weight loss, swollen lymph nodes, body  aches, joint swelling, muscle aches, chest pain, shortness of breath, mood changes.   Objective  There were no vitals taken for this visit. Systems examined below as of    General: No apparent distress alert and oriented x3 mood and affect normal, dressed appropriately.  HEENT: Pupils equal, extraocular movements intact  Respiratory: Patient's speak in full sentences and does not appear short of breath  Cardiovascular: No lower extremity edema, non tender, no erythema  Skin: Warm dry intact with no signs of infection or rash on extremities or on axial skeleton.  Abdomen: Soft nontender  Neuro: Cranial nerves II through XII are intact, neurovascularly intact in all extremities with 2+ DTRs and 2+ pulses.  Lymph: No  lymphadenopathy of posterior or anterior cervical chain or axillae bilaterally.  Gait normal with good balance and coordination.  MSK:  Non tender with full range of motion and good stability and symmetric strength and tone of shoulders, elbows, wrist, hip, and ankles bilaterally.  neck: Inspection unremarkable. No palpable stepoffs. Negative Spurling's maneuver. Full neck range of motion Grip strength and sensation normal in bilateral hands Strength good C4 to T1 distribution No sensory change to C4 to T1 Negative Hoffman sign bilaterally Reflexes normal  Knee: Normal to inspection with no erythema or effusion or obvious bony abnormalities. Palpation normal with no warmth, joint line tenderness, patellar tenderness, or condyle tenderness. ROM full in flexion and extension and lower leg rotation. Ligaments with solid consistent endpoints including ACL, PCL, LCL, MCL. Negative Mcmurray's, Apley's, and Thessalonian tests. Non painful patellar compression. Patellar glide without crepitus. Patellar and quadriceps tendons unremarkable. Hamstring and quadriceps strength is normal.    Impression and Recommendations:     This case required medical decision making of moderate complexity. The above documentation has been reviewed and is accurate and complete Judi SaaZachary M Smith, DO       Note: This dictation was prepared with Dragon dictation along with smaller phrase technology. Any transcriptional errors that result from this process are unintentional.

## 2018-06-03 ENCOUNTER — Telehealth: Payer: Self-pay

## 2018-06-03 ENCOUNTER — Ambulatory Visit: Payer: BLUE CROSS/BLUE SHIELD | Admitting: Family Medicine

## 2018-06-03 NOTE — Telephone Encounter (Signed)
Patient calling back stating that the form has been faxed over multiple times. Patient states they will try to send again.

## 2018-06-03 NOTE — Telephone Encounter (Signed)
Copied from CRM [id number]. Topic: General - Other >> May 31, 2018  3:38 PM Jilda Roche wrote: Reason for CRM: Patient called asking if the [facility] had faxed over her Insurance form today, please advise  Call back is [phone] >> May 31, 2018  4:41 PM Marquis Buggy A wrote: I do not have anything for this patient. Do you?

## 2018-06-03 NOTE — Telephone Encounter (Signed)
Spoke with patient and informed her of our new fax number. We please that has been the issue. [phone]

## 2018-06-09 ENCOUNTER — Ambulatory Visit
Admission: RE | Admit: 2018-06-09 | Discharge: 2018-06-09 | Disposition: A | Discharge status: Home / Self Care | Payer: BLUE CROSS/BLUE SHIELD | Source: Ambulatory Visit | Attending: Family | Admitting: Family

## 2018-06-09 DIAGNOSIS — R51 Headache: Principal | ICD-10-CM

## 2018-06-09 DIAGNOSIS — R519 Headache, unspecified: Secondary | ICD-10-CM

## 2018-06-09 DIAGNOSIS — G43909 Migraine, unspecified, not intractable, without status migrainosus: Secondary | ICD-10-CM | POA: Diagnosis not present

## 2018-06-14 ENCOUNTER — Ambulatory Visit: Payer: BLUE CROSS/BLUE SHIELD | Admitting: Family Medicine

## 2018-06-14 ENCOUNTER — Ambulatory Visit (INDEPENDENT_AMBULATORY_CARE_PROVIDER_SITE_OTHER): Payer: BLUE CROSS/BLUE SHIELD | Admitting: Family

## 2018-06-14 ENCOUNTER — Other Ambulatory Visit (INDEPENDENT_AMBULATORY_CARE_PROVIDER_SITE_OTHER): Payer: BLUE CROSS/BLUE SHIELD

## 2018-06-14 ENCOUNTER — Encounter: Payer: Self-pay | Admitting: Family

## 2018-06-14 VITALS — BP 132/84 | HR 94 | Temp 98.2°F | Ht 67.0 in | Wt 166.1 lb

## 2018-06-14 DIAGNOSIS — D509 Iron deficiency anemia, unspecified: Secondary | ICD-10-CM

## 2018-06-14 DIAGNOSIS — E790 Hyperuricemia without signs of inflammatory arthritis and tophaceous disease: Secondary | ICD-10-CM

## 2018-06-14 DIAGNOSIS — I10 Essential (primary) hypertension: Secondary | ICD-10-CM

## 2018-06-14 DIAGNOSIS — Z13220 Encounter for screening for lipoid disorders: Secondary | ICD-10-CM

## 2018-06-14 DIAGNOSIS — F458 Other somatoform disorders: Secondary | ICD-10-CM

## 2018-06-14 DIAGNOSIS — G43709 Chronic migraine without aura, not intractable, without status migrainosus: Secondary | ICD-10-CM

## 2018-06-14 LAB — CBC WITH DIFFERENTIAL/PLATELET
BASOS ABS: 0 10*3/uL (ref 0.0–0.1)
Basophils Relative: 0.4 % (ref 0.0–3.0)
EOS PCT: 0.5 % (ref 0.0–5.0)
Eosinophils Absolute: 0 10*3/uL (ref 0.0–0.7)
HCT: 39 % (ref 36.0–46.0)
HEMOGLOBIN: 13.1 g/dL (ref 12.0–15.0)
Lymphocytes Relative: 30.8 % (ref 12.0–46.0)
Lymphs Abs: 2.8 10*3/uL (ref 0.7–4.0)
MCHC: 33.6 g/dL (ref 30.0–36.0)
MCV: 86.3 fl (ref 78.0–100.0)
MONO ABS: 0.6 10*3/uL (ref 0.1–1.0)
Monocytes Relative: 6.6 % (ref 3.0–12.0)
Neutro Abs: 5.7 10*3/uL (ref 1.4–7.7)
Neutrophils Relative %: 61.7 % (ref 43.0–77.0)
Platelets: 409 10*3/uL — ABNORMAL HIGH (ref 150.0–400.0)
RBC: 4.53 Mil/uL (ref 3.87–5.11)
RDW: 12.6 % (ref 11.5–15.5)
WBC: 9.2 10*3/uL (ref 4.0–10.5)

## 2018-06-14 LAB — IRON,TIBC AND FERRITIN PANEL
%SAT: 27 % (calc) (ref 16–45)
Ferritin: 149 ng/mL (ref 16–232)
Iron: 84 ug/dL (ref 40–190)
TIBC: 313 mcg/dL (calc) (ref 250–450)

## 2018-06-14 LAB — COMPREHENSIVE METABOLIC PANEL
ALBUMIN: 4.4 g/dL (ref 3.5–5.2)
ALK PHOS: 55 U/L (ref 39–117)
ALT: 16 U/L (ref 0–35)
AST: 17 U/L (ref 0–37)
BUN: 11 mg/dL (ref 6–23)
CO2: 19 mEq/L (ref 19–32)
Calcium: 9.7 mg/dL (ref 8.4–10.5)
Chloride: 111 mEq/L (ref 96–112)
Creatinine, Ser: 0.69 mg/dL (ref 0.40–1.20)
GFR: 110.26 mL/min (ref >60.00)
Glucose, Bld: 88 mg/dL (ref 70–99)
POTASSIUM: 3.5 meq/L (ref 3.5–5.1)
Sodium: 140 mEq/L (ref 135–145)
TOTAL PROTEIN: 7.9 g/dL (ref 6.0–8.3)
Total Bilirubin: 0.3 mg/dL (ref 0.2–1.2)

## 2018-06-14 LAB — LIPID PANEL
CHOLESTEROL: 234 mg/dL — AB (ref 0–200)
HDL: 45.8 mg/dL (ref >39.00)
LDL CALC: 166 mg/dL — AB (ref 0–99)
NonHDL: 188.22
TRIGLYCERIDES: 113 mg/dL (ref 0.0–149.0)
Total CHOL/HDL Ratio: 5
VLDL: 22.6 mg/dL (ref 0.0–40.0)

## 2018-06-14 LAB — URIC ACID: Uric Acid, Serum: 4.1 mg/dL (ref 2.4–7.0)

## 2018-06-14 MED ORDER — OLMESARTAN MEDOXOMIL 20 MG PO TABS: 20.0000 mg | ORAL_TABLET | Freq: Every day | ORAL | 1 refills | Status: DC

## 2018-06-14 MED ORDER — TOPIRAMATE 25 MG PO TABS: 25.0000 mg | ORAL_TABLET | Freq: Two times a day (BID) | ORAL | 1 refills | Status: DC

## 2018-06-14 MED ORDER — AMLODIPINE BESYLATE 10 MG PO TABS: 10.0000 mg | ORAL_TABLET | Freq: Every day | ORAL | 0 refills | Status: DC

## 2018-06-14 NOTE — Progress Notes (Signed)
[name] is a 48 y.o. female with the following history as recorded in EpicCare:  Patient Active Problem List   Diagnosis Date Noted  . Essential hypertension 05/14/2018  . Tobacco abuse 05/14/2018  . Migraine 05/14/2018  . Gout 05/14/2018    Current Outpatient Medications  Medication Sig Dispense Refill  . amLODipine (NORVASC) 10 MG tablet Take 1 tablet (10 mg total) by mouth daily. 90 tablet 0  . aspirin-acetaminophen-caffeine (EXCEDRIN MIGRAINE) 250-250-65 MG tablet Take 2 tablets by mouth every 6 (six) hours as needed for headache.    . ibuprofen (ADVIL,MOTRIN) 200 MG tablet Take 800 mg by mouth every 6 (six) hours as needed.    . naproxen sodium (ALEVE) 220 MG tablet Take 440 mg by mouth 2 (two) times daily as needed.    Marland Kitchen olmesartan (BENICAR) 20 MG tablet Take 1 tablet (20 mg total) by mouth daily. 90 tablet 1  . topiramate (TOPAMAX) 25 MG tablet Take 1 tablet (25 mg total) by mouth 2 (two) times daily. 180 tablet 1   No current facility-administered medications for this visit.     Allergies: Xanax [alprazolam]  Past Medical History:  Diagnosis Date  . Gout   . Hypertension   . Migraines     Past Surgical History:  Procedure Laterality Date  . ABDOMINAL HYSTERECTOMY  2008    History reviewed. No pertinent family history.  Social History   Tobacco Use  . Smoking status: Current Every Day Smoker    Packs/day: 0.10    Types: Cigarettes  . Smokeless tobacco: Never Used  Substance Use Topics  . Alcohol use: Yes    Comment: occasional    Subjective:  Follow-up on chronic conditions:  1) HTN- pleased with response to medications; Denies any chest pain, shortness of breath, blurred vision or headache. 2) Migraines are "better" since starting the Topamax- has noticed improvement; "feels better" but does not want to increase dosage due to concerns for weight loss. Has had normal MRI;  3) TMJ- has realized that she is grinding her teeth and this is contributing;  planning to get mouthguard;    Objective:  Vitals:   06/14/18 0942  BP: 132/84  Pulse: 94  Temp: 98.2 F (36.8 C)  TempSrc: Oral  SpO2: 99%  Weight: 166 lb 1.3 oz (75.3 kg)  Height: '5\' 7"'$  (1.702 m)    General: Well developed, well nourished, in no acute distress  Skin : Warm and dry.  Head: Normocephalic and atraumatic  Eyes: Sclera and conjunctiva clear; pupils round and reactive to light; extraocular movements intact  Ears: External normal; canals clear; tympanic membranes normal  Oropharynx: Pink, supple. No suspicious lesions  Neck: Supple without thyromegaly, adenopathy  Lungs: Respirations unlabored; clear to auscultation bilaterally without wheeze, rales, rhonchi  CVS exam: normal rate and regular rhythm.  Neurologic: Alert and oriented; speech intact; face symmetrical; moves all extremities well; CNII-XII intact without focal deficit   Assessment:  1. Essential hypertension   2. Lipid screening   3. Elevated uric acid in blood   4. Iron deficiency anemia, unspecified iron deficiency anemia type   5. Chronic migraine without aura without status migrainosus, not intractable   6. Teeth grinding     Plan:  1. Stable; check CBC, CMP; continue same medications; 2. Update lipids; has been prescribed statin in the past; 3. ? Gout- check uric acid. 4. Update anemia panel today- anemia prior to hysterectomy. 5. Improving- will not increase Topamax per patient request; 6. Encouraged  dental follow-up/ use of mouth guard;  Follow-up in about 3-4 months.    No follow-ups on file.  Orders Placed This Encounter  Procedures  . CBC w/Diff    Standing Status:   Future    Number of Occurrences:   1    Standing Expiration Date:   06/14/2019  . Comp Met (CMET)    Standing Status:   Future    Number of Occurrences:   1    Standing Expiration Date:   06/14/2019  . Lipid panel    Standing Status:   Future    Number of Occurrences:   1    Standing Expiration Date:   06/15/2019   . Iron, TIBC and Ferritin Panel    Standing Status:   Future    Number of Occurrences:   1    Standing Expiration Date:   06/15/2019  . Uric acid    Standing Status:   Future    Number of Occurrences:   1    Standing Expiration Date:   06/14/2019    Requested Prescriptions   Signed Prescriptions Disp Refills  . olmesartan (BENICAR) 20 MG tablet 90 tablet 1    Sig: Take 1 tablet (20 mg total) by mouth daily.  Marland Kitchen amLODipine (NORVASC) 10 MG tablet 90 tablet 0    Sig: Take 1 tablet (10 mg total) by mouth daily.  Marland Kitchen topiramate (TOPAMAX) 25 MG tablet 180 tablet 1    Sig: Take 1 tablet (25 mg total) by mouth 2 (two) times daily.

## 2018-06-14 NOTE — Patient Instructions (Signed)
You could try Refresh Artifical Tears;

## 2018-06-18 ENCOUNTER — Other Ambulatory Visit: Payer: Self-pay | Admitting: Family

## 2018-06-18 MED ORDER — ROSUVASTATIN CALCIUM 10 MG PO TABS: 10.0000 mg | ORAL_TABLET | Freq: Every day | ORAL | 1 refills | Status: DC

## 2018-06-20 NOTE — Progress Notes (Signed)
[name] [address] [place], [place] [address] Phone: [phone] Subjective:    I [name] am serving as a Neurosurgeon for Dr. Antoine Primas.   CC: Knee pain, neck pain follow-up  [id number]  [name] is a 48 y.o. female coming in with complaint of right knee and right arm/ neck pain. Had elbow surgery in 2015. Now has tingling in her finger tips. States that it feels her knee wants to give out. xrays show arthritis in her knee. Has had knee injection that did not help. Stands all day at work. Worse at night. Numbness and tingling bilaterally in the toes. Back pain with lifting. Has scoliosis.   Onset- Chronic Location- Upper trap Duration-  Character- sharp, dull, achy, sore Aggravating factors- pain with stairs, shoulder pain with ADL Reliving factors-  Therapies tried- Ice and heat Severity-both of them 9 out of 10   Past medical history significant for an ulnar transposition of the right arm  Past Medical History:  Diagnosis Date  . Gout   . Hypertension   . Migraines    Past Surgical History:  Procedure Laterality Date  . ABDOMINAL HYSTERECTOMY  2008   Social History   Socioeconomic History  . Marital status: Single    Spouse name: Not on file  . Number of children: Not on file  . Years of education: Not on file  . Highest education level: Not on file  Occupational History  . Not on file  Social Needs  . Financial resource strain: Not on file  . Food insecurity:    Worry: Not on file    Inability: Not on file  . Transportation needs:    Medical: Not on file    Non-medical: Not on file  Tobacco Use  . Smoking status: Current Every Day Smoker    Packs/day: 0.10    Types: Cigarettes  . Smokeless tobacco: Never Used  Substance and Sexual Activity  . Alcohol use: Yes    Comment: occasional  . Drug use: No  . Sexual activity: Not on file  Lifestyle  . Physical activity:    Days per week: Not on file   Minutes per session: Not on file  . Stress: Not on file  Relationships  . Social connections:    Talks on phone: Not on file    Gets together: Not on file    Attends religious service: Not on file    Active member of club or organization: Not on file    Attends meetings of clubs or organizations: Not on file    Relationship status: Not on file  Other Topics Concern  . Not on file  Social History Narrative  . Not on file   Allergies  Allergen Reactions  . Xanax [Alprazolam] Itching   No family history on file.   Current Outpatient Medications (Cardiovascular):  .  amLODipine (NORVASC) 10 MG tablet, Take 1 tablet (10 mg total) by mouth daily. Marland Kitchen  olmesartan (BENICAR) 20 MG tablet, Take 1 tablet (20 mg total) by mouth daily. .  rosuvastatin (CRESTOR) 10 MG tablet, Take 1 tablet (10 mg total) by mouth daily.   Current Outpatient Medications (Analgesics):  .  aspirin-acetaminophen-caffeine (EXCEDRIN MIGRAINE) 250-250-65 MG tablet, Take 2 tablets by mouth every 6 (six) hours as needed for headache. .  ibuprofen (ADVIL,MOTRIN) 200 MG tablet, Take 800 mg by mouth every 6 (six) hours as needed. .  naproxen sodium (ALEVE) 220 MG tablet, Take 440  mg by mouth 2 (two) times daily as needed. .  meloxicam (MOBIC) 15 MG tablet, Take 1 tablet (15 mg total) by mouth daily.   Current Outpatient Medications (Other):  .  topiramate (TOPAMAX) 25 MG tablet, Take 1 tablet (25 mg total) by mouth 2 (two) times daily. .  Diclofenac Sodium (PENNSAID) 2 % SOLN, Place 2 g onto the skin 2 (two) times daily. Marland Kitchen  gabapentin (NEURONTIN) 100 MG capsule, Take 2 capsules (200 mg total) by mouth at bedtime.    Past medical history, social, surgical and family history all reviewed in electronic medical record.  No pertanent information unless stated regarding to the chief complaint.   Review of Systems:  No headache, visual changes, nausea, vomiting, diarrhea, constipation, dizziness, abdominal pain, skin rash,  fevers, chills, night sweats, weight loss, swollen lymph nodes, , chest pain, shortness of breath, mood changes.  Positive muscle aches, body aches, joint swelling  Objective  Blood pressure 124/70, pulse 96, height 5\' 7"  (1.702 m), weight 164 lb (74.4 kg), SpO2 98 %.   General: No apparent distress alert and oriented x3 mood and affect normal, dressed appropriately.  HEENT: Pupils equal, extraocular movements intact  Respiratory: Patient's speak in full sentences and does not appear short of breath  Cardiovascular: No lower extremity edema, non tender, no erythema  Skin: Warm dry intact with no signs of infection or rash on extremities or on axial skeleton.  Abdomen: Soft nontender  Neuro: Cranial nerves II through XII are intact, neurovascularly intact in all extremities with 2+ DTRs and 2+ pulses.  Lymph: No lymphadenopathy of posterior or anterior cervical chain or axillae bilaterally.  Gait mild antalgic MSK:  tender with mild limited range of motion and good stability and symmetric strength and tone of shoulders, elbows, wrist, hip and ankles bilaterally.  Knee: Normal to inspection with no erythema or effusion or obvious bony abnormalities. Palpation shows the patient has pain out of proportion to the amount of palpation ROM full in flexion and extension and lower leg rotation. Ligaments with solid consistent endpoints including ACL, PCL, LCL, MCL. Negative Mcmurray's, Apley's, and Thessalonian tests. painful patellar compression. Patellar glide moderate crepitus. Patellar and quadriceps tendons unremarkable. Hamstring and quadriceps strength is normal.  Neck: Inspection mild loss of lordosis. Patient describes Spurling's with radicular symptoms down the arm Full neck range of motion patient does have poor participation with exam Grip strength and sensation normal in bilateral hands Strength good C4 to T1 distribution No sensory change to C4 to T1 Negative Hoffman sign  bilaterally Reflexes normal Right elbow shows the ulnar nerve transposition.  No signs of infection of the incision  [id number]; 15 additional minutes spent for Therapeutic exercises as stated in above notes.  This included exercises focusing on stretching, strengthening, with significant focus on eccentric aspects.   Long term goals include an improvement in range of motion, strength, endurance as well as avoiding reinjury. Patient's frequency would include in 1-2 times a day, 3-5 times a week for a duration of 6-12 weeks. Patellofemoral Syndrome  Reviewed anatomy using anatomical model and how PFS occurs.  Given rehab exercises handout for VMO, hip abductors, core, entire kinetic chain including proprioception exercises including cone touches, step downs, hip elevations and turn outs.  Could benefit from PT, regular exercise, upright biking, and a PFS knee brace to assist with tracking abnormalities.   Proper technique shown and discussed handout in great detail with ATC.  All questions were discussed and answered.  Impression and Recommendations:     This case required medical decision making of moderate complexity. The above documentation has been reviewed and is accurate and complete Lyndal Pulley, DO       Note: This dictation was prepared with Dragon dictation along with smaller phrase technology. Any transcriptional errors that result from this process are unintentional.

## 2018-06-21 ENCOUNTER — Other Ambulatory Visit: Payer: Self-pay

## 2018-06-21 ENCOUNTER — Ambulatory Visit (INDEPENDENT_AMBULATORY_CARE_PROVIDER_SITE_OTHER): Payer: BLUE CROSS/BLUE SHIELD | Admitting: Family Medicine

## 2018-06-21 ENCOUNTER — Encounter: Payer: Self-pay | Admitting: Family Medicine

## 2018-06-21 DIAGNOSIS — M1711 Unilateral primary osteoarthritis, right knee: Secondary | ICD-10-CM | POA: Insufficient documentation

## 2018-06-21 DIAGNOSIS — M5412 Radiculopathy, cervical region: Secondary | ICD-10-CM

## 2018-06-21 MED ORDER — GABAPENTIN 100 MG PO CAPS: 200.0000 mg | ORAL_CAPSULE | Freq: Every day | ORAL | 3 refills | Status: DC

## 2018-06-21 MED ORDER — MELOXICAM 15 MG PO TABS: 15.0000 mg | ORAL_TABLET | Freq: Every day | ORAL | 0 refills | Status: DC

## 2018-06-21 MED ORDER — DICLOFENAC SODIUM 2 % TD SOLN: 2.0000 g | Freq: Two times a day (BID) | TRANSDERMAL | 3 refills | Status: DC

## 2018-06-21 NOTE — Assessment & Plan Note (Signed)
Normal muscle tightness.  Discussed with patient at this point that there is a possibility for radiculopathy.  Started on low-dose gabapentin.  Warned of potential side effects.  Discussed icing regimen.  Discussed topical anti-inflammatories.  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  Follow-up again in 4 to 6 weeks.

## 2018-06-21 NOTE — Assessment & Plan Note (Signed)
Mild to moderate.  Discussed with patient a great length, to provide given, topical anti-inflammatories and oral anti-inflammatories.  Discussed icing regimen.  Follow-up again in 4 to 6 weeks worsening pain consider injection or formal physical therapy.

## 2018-06-21 NOTE — Patient Instructions (Addendum)
Good to see you  Ice 20 minutes 2 times daily. Usually after activity and before bed. Exercises 3 times a week.   Try the brace daily for next 2 weeks then with a lot of activity  Meloxicam daily for 10 days then as needed Gabapentin 200mg  at night pennsaid pinkie amount topically 2 times daily as needed.  See me again in 5-6 weeks and you should be doing great

## 2018-06-25 ENCOUNTER — Ambulatory Visit
Admission: RE | Admit: 2018-06-25 | Discharge: 2018-06-25 | Disposition: A | Discharge status: Home / Self Care | Payer: BLUE CROSS/BLUE SHIELD | Source: Ambulatory Visit | Attending: Family | Admitting: Family

## 2018-06-25 DIAGNOSIS — Z1231 Encounter for screening mammogram for malignant neoplasm of breast: Secondary | ICD-10-CM

## 2018-06-28 ENCOUNTER — Encounter: Payer: Self-pay | Admitting: Family

## 2018-06-28 ENCOUNTER — Ambulatory Visit (INDEPENDENT_AMBULATORY_CARE_PROVIDER_SITE_OTHER): Payer: BLUE CROSS/BLUE SHIELD | Admitting: Family

## 2018-06-28 ENCOUNTER — Ambulatory Visit (INDEPENDENT_AMBULATORY_CARE_PROVIDER_SITE_OTHER)
Admission: RE | Admit: 2018-06-28 | Discharge: 2018-06-28 | Disposition: A | Discharge status: Home / Self Care | Payer: BLUE CROSS/BLUE SHIELD | Source: Ambulatory Visit | Attending: Family | Admitting: Family

## 2018-06-28 VITALS — BP 126/78 | HR 91 | Temp 98.0°F | Ht 67.0 in | Wt 171.1 lb

## 2018-06-28 DIAGNOSIS — M25511 Pain in right shoulder: Secondary | ICD-10-CM

## 2018-06-28 DIAGNOSIS — M19011 Primary osteoarthritis, right shoulder: Secondary | ICD-10-CM | POA: Diagnosis not present

## 2018-06-28 DIAGNOSIS — M542 Cervicalgia: Secondary | ICD-10-CM | POA: Diagnosis not present

## 2018-06-28 MED ORDER — METHYLPREDNISOLONE 4 MG PO TBPK: ORAL_TABLET | ORAL | 0 refills | Status: DC

## 2018-06-28 MED ORDER — TOPIRAMATE 25 MG PO TABS: 25.0000 mg | ORAL_TABLET | Freq: Every day | ORAL | 1 refills | Status: DC

## 2018-06-28 MED ORDER — HYDROCODONE-ACETAMINOPHEN 5-325 MG PO TABS: 1.0000 | ORAL_TABLET | Freq: Four times a day (QID) | ORAL | 0 refills | Status: DC | PRN

## 2018-06-28 MED ORDER — METHYLPREDNISOLONE ACETATE 40 MG/ML IJ SUSP
40.0000 mg | Freq: Once | INTRAMUSCULAR | Status: AC
  Administered 2018-06-28: 40 mg via INTRAMUSCULAR

## 2018-06-28 NOTE — Progress Notes (Signed)
[name] is a 48 y.o. female with the following history as recorded in EpicCare:  Patient Active Problem List   Diagnosis Date Noted  . Patellofemoral arthritis of right knee 06/21/2018  . Cervical radiculopathy 06/21/2018  . Essential hypertension 05/14/2018  . Tobacco abuse 05/14/2018  . Migraine 05/14/2018  . Gout 05/14/2018    Current Outpatient Medications  Medication Sig Dispense Refill  . amLODipine (NORVASC) 10 MG tablet Take 1 tablet (10 mg total) by mouth daily. 90 tablet 0  . aspirin-acetaminophen-caffeine (EXCEDRIN MIGRAINE) 250-250-65 MG tablet Take 2 tablets by mouth every 6 (six) hours as needed for headache.    . Diclofenac Sodium (PENNSAID) 2 % SOLN Place 2 g onto the skin 2 (two) times daily. 112 g 3  . gabapentin (NEURONTIN) 100 MG capsule Take 2 capsules (200 mg total) by mouth at bedtime. 60 capsule 3  . ibuprofen (ADVIL,MOTRIN) 200 MG tablet Take 800 mg by mouth every 6 (six) hours as needed.    . meloxicam (MOBIC) 15 MG tablet Take 1 tablet (15 mg total) by mouth daily. 30 tablet 0  . naproxen sodium (ALEVE) 220 MG tablet Take 440 mg by mouth 2 (two) times daily as needed.    Marland Kitchen olmesartan (BENICAR) 20 MG tablet Take 1 tablet (20 mg total) by mouth daily. 90 tablet 1  . rosuvastatin (CRESTOR) 10 MG tablet Take 1 tablet (10 mg total) by mouth daily. 90 tablet 1  . topiramate (TOPAMAX) 25 MG tablet Take 1 tablet (25 mg total) by mouth daily. 180 tablet 1  . HYDROcodone-acetaminophen (NORCO) 5-325 MG tablet Take 1 tablet by mouth every 6 (six) hours as needed for moderate pain. 20 tablet 0  . methylPREDNISolone (MEDROL DOSEPAK) 4 MG TBPK tablet Taper as directed 21 tablet 0   No current facility-administered medications for this visit.     Allergies: Xanax [alprazolam]  Past Medical History:  Diagnosis Date  . Gout   . Hypertension   . Migraines     Past Surgical History:  Procedure Laterality Date  . ABDOMINAL HYSTERECTOMY  2008    History  reviewed. No pertinent family history.  Social History   Tobacco Use  . Smoking status: Current Every Day Smoker    Packs/day: 0.10    Types: Cigarettes  . Smokeless tobacco: Never Used  Substance Use Topics  . Alcohol use: Yes    Comment: occasional    Subjective:  Patient presents with concerns for pain/ swelling in her right elbow- radiating down to her right hand; notes that pain feels like it is radiating from her shoulder down into her hand; denies any injury or trauma; job does involve repetitive motion but does not remember any unusual activities yesterday; states that pain woke her up this morning; notes that her arm/ hand feel better if she keeps the arm bent/ hand against her chest; no chest pain, no shortness of breath, no headache, no dizziness. Has required surgery on her right elbow in the past- "nerve surgery" secondary to complications from fall. Orthopedic at [facility];   Objective:  Vitals:   06/28/18 1010  BP: 126/78  Pulse: 91  Temp: 98 F (36.7 C)  TempSrc: Oral  SpO2: 99%  Weight: 171 lb 1.3 oz (77.6 kg)  Height: 5\' 7"  (1.702 m)    General: Well developed, well nourished, in no acute distress  Skin : Warm and dry.  Head: Normocephalic and atraumatic  Eyes: Sclera and conjunctiva clear; pupils round and reactive to  light; extraocular movements intact  Lungs: Respirations unlabored;  CVS exam: normal rate and regular rhythm.  Musculoskeletal: No deformities; swelling noted over right hand; LROM in right shoulder and hand due to pain;   Extremities: No edema, cyanosis, clubbing  Vessels: Symmetric bilaterally  Neurologic: Alert and oriented; speech intact; face symmetrical; moves all extremities well; CNII-XII intact without focal deficit  Assessment:  1. Neck pain   2. Acute pain of right shoulder     Plan:  ? Source of symptoms; since no injury to hand, do not feel fracture is likely; will update cervical and shoulder X-ray today;  Depo-Medrol IM 40 mg given today- start Medrol Dose pak tomorrow; Rx for Norco for pain- data base checked and no fills since 2018; will most likely need to refer her back to her orthopedist. Follow-up to be determined.  No follow-ups on file.  Orders Placed This Encounter  Procedures  . DG Cervical Spine 2 or 3 views    Standing Status:   Future    Number of Occurrences:   1    Standing Expiration Date:   08/27/2019    Order Specific Question:   Reason for Exam (SYMPTOM  OR DIAGNOSIS REQUIRED)    Answer:   neck pain    Order Specific Question:   Is patient pregnant?    Answer:   No    Order Specific Question:   Preferred imaging location?    Answer:   [address]    Order Specific Question:   Radiology Contrast Protocol - do NOT remove file path    Answer:   \\[email or web address]s.pdf  . DG Shoulder Right    Standing Status:   Future    Number of Occurrences:   1    Standing Expiration Date:   08/27/2019    Order Specific Question:   Reason for Exam (SYMPTOM  OR DIAGNOSIS REQUIRED)    Answer:   right shoulder pain    Order Specific Question:   Is patient pregnant?    Answer:   No    Order Specific Question:   Preferred imaging location?    Answer:   [address]    Order Specific Question:   Radiology Contrast Protocol - do NOT remove file path    Answer:   \\[email or web address]    Requested Prescriptions   Signed Prescriptions Disp Refills  . methylPREDNISolone (MEDROL DOSEPAK) 4 MG TBPK tablet 21 tablet 0    Sig: Taper as directed  . topiramate (TOPAMAX) 25 MG tablet 180 tablet 1    Sig: Take 1 tablet (25 mg total) by mouth daily.  Marland Kitchen. HYDROcodone-acetaminophen (NORCO) 5-325 MG tablet 20 tablet 0    Sig: Take 1 tablet by mouth every 6 (six) hours as needed for moderate pain.

## 2018-07-04 ENCOUNTER — Institutional Professional Consult (permissible substitution): Payer: Self-pay | Admitting: Neurology

## 2018-07-26 ENCOUNTER — Ambulatory Visit: Payer: BLUE CROSS/BLUE SHIELD | Admitting: Family Medicine

## 2018-09-02 ENCOUNTER — Other Ambulatory Visit: Payer: Self-pay

## 2018-09-02 ENCOUNTER — Ambulatory Visit (INDEPENDENT_AMBULATORY_CARE_PROVIDER_SITE_OTHER): Payer: BLUE CROSS/BLUE SHIELD | Admitting: Family Medicine

## 2018-09-02 ENCOUNTER — Encounter: Payer: Self-pay | Admitting: Family Medicine

## 2018-09-02 ENCOUNTER — Ambulatory Visit: Payer: Self-pay | Admitting: *Deleted

## 2018-09-02 DIAGNOSIS — M1711 Unilateral primary osteoarthritis, right knee: Secondary | ICD-10-CM

## 2018-09-02 NOTE — Telephone Encounter (Signed)
Pt scheduled for today at 10:30

## 2018-09-02 NOTE — Telephone Encounter (Addendum)
Contacted pt regarding her symptoms; she states that she is having right knee pain which started 09/01/2018; the pain started suddenly; she also states that it is swollen; she applied ice to the site; the pt rates her pain at 10 out of 10; the pt states that she took 2 gabapentin, and then 1 hydrocodone last night; the pt also says that this is not new, and her knee pain is worse when it rains; she had previously worn a knee brace; the pt says that her entire right lower leg is swollen from her knee to her ankle;  Recommendations made per nurse triage protocol; the pt normally sees Dr Antoine Primas; conference call initiated with Mardella Layman at [facility];per Mardella Layman pt offered and accepted appointment with Dr Antoine Primas, [facility], 09/02/2018 at 1030; she verbalized understanding; will route to office for notification.  Reason for Disposition . [1] SEVERE pain (e.g., excruciating, unable to walk) AND [2] not improved after 2 hours of pain medicine  Answer Assessment - Initial Assessment Questions 1. LOCATION and RADIATION: "Where is the pain located?"      Right kne; 2. QUALITY: "What does the pain feel like?"  (e.g., sharp, dull, aching, burning)    dull 3. SEVERITY: "How bad is the pain?" "What does it keep you from doing?"   (Scale 1-10; or mild, moderate, severe)   -  MILD (1-3): doesn't interfere with normal activities    -  MODERATE (4-7): interferes with normal activities (e.g., work or school) or awakens from sleep, limping    -  SEVERE (8-10): excruciating pain, unable to do any normal activities, unable to walk     10 out of 10 4. ONSET: "When did the pain start?" "Does it come and go, or is it there all the time?"     Sudden, constant 5. RECURRENT: "Have you had this pain before?" If so, ask: "When, and what happened then?"    Ongoing problem 6. SETTING: "Has there been any recent work, exercise or other activity that involved that part of the body?"     Pt was in bed 7. AGGRAVATING  FACTORS: "What makes the knee pain worse?" (e.g., walking, climbing stairs, running)     walking 8. ASSOCIATED SYMPTOMS: "Is there any swelling or redness of the knee?"    swelling 9. OTHER SYMPTOMS: "Do you have any other symptoms?" (e.g., chest pain, difficulty breathing, fever, calf pain)     no 10. PREGNANCY: "Is there any chance you are pregnant?" "When was your last menstrual period?"      No hysterectomy  Protocols used: KNEE PAIN-A-AH

## 2018-09-02 NOTE — Assessment & Plan Note (Signed)
Moderate to severe.  Injection given today.  Discussed x-rays with patient declined.  Patient wanted to leave after the injection and wanted to avoid any other contact with the coronavirus outbreak.  We discussed continued bracing, icing regimen, meloxicam for breakthrough pain.  Follow-up again 4 to 8 weeks

## 2018-09-02 NOTE — Patient Instructions (Signed)
Good to see you  Ice is your friend Stay active Injected the knee as well  See me again in 4 weeks to make sure you are doing well

## 2018-09-02 NOTE — Progress Notes (Signed)
[name] [address] [place], [place] [address] Phone: [phone] Subjective:   I [name] am serving as a Neurosurgeon for Dr. Antoine Primas.    CC:   [id number]   06/21/2018 Mild to moderate.  Discussed with patient a great length, to provide given, topical anti-inflammatories and oral anti-inflammatories.  Discussed icing regimen.  Follow-up again in 4 to 6 weeks worsening pain consider injection or formal physical therapy.  Normal muscle tightness.  Discussed with patient at this point that there is a possibility for radiculopathy.  Started on low-dose gabapentin.  Warned of potential side effects.  Discussed icing regimen.  Discussed topical anti-inflammatories.  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  Follow-up again in 4 to 6 weeks.  Updated 09/02/2018 [name] is a 48 y.o. female coming in with complaint of right knee pain. States that her knee is not doing any better. Last night was the worse. Cut her grass Saturday and felt fine. Yesterday pain returned after laying down. Swelling. Icing and gabapentin. Gabapentin does not help. Pain from her knee to her ankle. Slept with a pillow under her knee. Rain makes the knee worse.  Patient states that he does have swelling at the end of a long day.      Past Medical History:  Diagnosis Date  . Gout   . Hypertension   . Migraines    Past Surgical History:  Procedure Laterality Date  . ABDOMINAL HYSTERECTOMY  2008   Social History   Socioeconomic History  . Marital status: Single    Spouse name: Not on file  . Number of children: Not on file  . Years of education: Not on file  . Highest education level: Not on file  Occupational History  . Not on file  Social Needs  . Financial resource strain: Not on file  . Food insecurity:    Worry: Not on file    Inability: Not on file  . Transportation needs:    Medical: Not on file    Non-medical: Not on file   Tobacco Use  . Smoking status: Current Every Day Smoker    Packs/day: 0.10    Types: Cigarettes  . Smokeless tobacco: Never Used  Substance and Sexual Activity  . Alcohol use: Yes    Comment: occasional  . Drug use: No  . Sexual activity: Not on file  Lifestyle  . Physical activity:    Days per week: Not on file    Minutes per session: Not on file  . Stress: Not on file  Relationships  . Social connections:    Talks on phone: Not on file    Gets together: Not on file    Attends religious service: Not on file    Active member of club or organization: Not on file    Attends meetings of clubs or organizations: Not on file    Relationship status: Not on file  Other Topics Concern  . Not on file  Social History Narrative  . Not on file   Allergies  Allergen Reactions  . Xanax [Alprazolam] Itching   No family history on file.  Current Outpatient Medications (Endocrine & Metabolic):  .  methylPREDNISolone (MEDROL DOSEPAK) 4 MG TBPK tablet, Taper as directed  Current Outpatient Medications (Cardiovascular):  .  amLODipine (NORVASC) 10 MG tablet, Take 1 tablet (10 mg total) by mouth daily. Marland Kitchen  olmesartan (BENICAR) 20 MG tablet, Take 1 tablet (20 mg total)  by mouth daily. .  rosuvastatin (CRESTOR) 10 MG tablet, Take 1 tablet (10 mg total) by mouth daily.   Current Outpatient Medications (Analgesics):  .  aspirin-acetaminophen-caffeine (EXCEDRIN MIGRAINE) 250-250-65 MG tablet, Take 2 tablets by mouth every 6 (six) hours as needed for headache. Marland Kitchen  HYDROcodone-acetaminophen (NORCO) 5-325 MG tablet, Take 1 tablet by mouth every 6 (six) hours as needed for moderate pain. Marland Kitchen  ibuprofen (ADVIL,MOTRIN) 200 MG tablet, Take 800 mg by mouth every 6 (six) hours as needed. .  meloxicam (MOBIC) 15 MG tablet, Take 1 tablet (15 mg total) by mouth daily. .  naproxen sodium (ALEVE) 220 MG tablet, Take 440 mg by mouth 2 (two) times daily as needed.   Current Outpatient Medications (Other):  Marland Kitchen   Diclofenac Sodium (PENNSAID) 2 % SOLN, Place 2 g onto the skin 2 (two) times daily. Marland Kitchen  gabapentin (NEURONTIN) 100 MG capsule, Take 2 capsules (200 mg total) by mouth at bedtime. .  topiramate (TOPAMAX) 25 MG tablet, Take 1 tablet (25 mg total) by mouth daily.    Past medical history, social, surgical and family history all reviewed in electronic medical record.  No pertanent information unless stated regarding to the chief complaint.   Review of Systems:  No headache, visual changes, nausea, vomiting, diarrhea, constipation, dizziness, abdominal pain, skin rash, fevers, chills, night sweats, weight loss, swollen lymph nodes, body aches, joint swelling,  chest pain, shortness of breath, mood changes.  Positive muscle aches  Objective  Blood pressure 98/60, pulse 81, height 5\' 7"  (1.702 m), weight 170 lb (77.1 kg), SpO2 98 %. Systems examined below as of    General: No apparent distress alert and oriented x3 mood and affect normal, dressed appropriately.  HEENT: Pupils equal, extraocular movements intact  Respiratory: Patient's speak in full sentences and does not appear short of breath  Cardiovascular: No lower extremity edema, non tender, no erythema  Skin: Warm dry intact with no signs of infection or rash on extremities or on axial skeleton.  Abdomen: Soft nontender  Neuro: Cranial nerves II through XII are intact, neurovascularly intact in all extremities with 2+ DTRs and 2+ pulses.  Lymph: No lymphadenopathy of posterior or anterior cervical chain or axillae bilaterally.  Gait antalgic gait MSK:  Non tender with full range of motion and good stability and symmetric strength and tone of shoulders, elbows, wrist, hip, and ankles bilaterally.  Right knee shows the patient does have crepitus with range of motion.  Patellofemoral.  Lateral tracking noted.  Severe tenderness with compression over the patella.  Minimal pain over the medial joint line.  No significant instability though noted.   After informed written and verbal consent, patient was seated on exam table. Right knee was prepped with alcohol swab and utilizing anterolateral approach, patient's right knee space was injected with 4:1  marcaine 0.5%: Kenalog 40mg /dL. Patient tolerated the procedure well without immediate complications.     Impression and Recommendations:     This case required medical decision making of moderate complexity. The above documentation has been reviewed and is accurate and complete Judi Saa, DO       Note: This dictation was prepared with Dragon dictation along with smaller phrase technology. Any transcriptional errors that result from this process are unintentional.

## 2018-09-03 ENCOUNTER — Ambulatory Visit: Payer: BLUE CROSS/BLUE SHIELD | Admitting: Family Medicine

## 2018-09-04 ENCOUNTER — Telehealth: Payer: Self-pay | Admitting: Family

## 2018-09-04 NOTE — Telephone Encounter (Signed)
Pt left a vm requesting if Dr. Katrinka Blazing would be willing to call something in for the pain in knee. Per vm pt states she came Monday for a shot and has been using ice for the swelling but she is still having pain. Pain level a 7

## 2018-09-04 NOTE — Telephone Encounter (Signed)
Spoke with patient and she is coming in on Monday for visco.

## 2018-09-06 NOTE — Progress Notes (Signed)
[name] D.O[facility] [address] [place], [place] [address] Phone: [phone] Subjective:   I Katherine NighKana Terry am serving as a Neurosurgeonscribe for Dr. Antoine PrimasZachary Terry.   CC: Right knee pain follow-up  BJY:[id number]:Subjective   09/02/2018 Moderate to severe.  Injection given today.  Discussed x-rays with patient declined.  Patient wanted to leave after the injection and wanted to avoid any other contact with the coronavirus outbreak.  We discussed continued bracing, icing regimen, meloxicam for breakthrough pain.  Follow-up again 4 to 8 weeks  Updated 4/163/2020 Katherine PerchesShawanna D Terry is a 48 y.o. female coming in with complaint of right knee pain. States that her last injection did not hurt. Was still in pain. 10/10 on the pain scale.  Patient feels that the injection did not help.  Does have patellofemoral arthritis.  Patient is concerned because it is affecting daily activities.      Past Medical History:  Diagnosis Date  . Gout   . Hypertension   . Migraines    Past Surgical History:  Procedure Laterality Date  . ABDOMINAL HYSTERECTOMY  2008   Social History   Socioeconomic History  . Marital status: Single    Spouse name: Not on file  . Number of children: Not on file  . Years of education: Not on file  . Highest education level: Not on file  Occupational History  . Not on file  Social Needs  . Financial resource strain: Not on file  . Food insecurity:    Worry: Not on file    Inability: Not on file  . Transportation needs:    Medical: Not on file    Non-medical: Not on file  Tobacco Use  . Smoking status: Current Every Day Smoker    Packs/day: 0.10    Types: Cigarettes  . Smokeless tobacco: Never Used  Substance and Sexual Activity  . Alcohol use: Yes    Comment: occasional  . Drug use: No  . Sexual activity: Not on file  Lifestyle  . Physical activity:    Days per week: Not on file    Minutes per session: Not on file  . Stress: Not on file   Relationships  . Social connections:    Talks on phone: Not on file    Gets together: Not on file    Attends religious service: Not on file    Active member of club or organization: Not on file    Attends meetings of clubs or organizations: Not on file    Relationship status: Not on file  Other Topics Concern  . Not on file  Social History Narrative  . Not on file   Allergies  Allergen Reactions  . Xanax [Alprazolam] Itching   No family history on file.  Current Outpatient Medications (Endocrine & Metabolic):  .  methylPREDNISolone (MEDROL DOSEPAK) 4 MG TBPK tablet, Taper as directed  Current Outpatient Medications (Cardiovascular):  .  amLODipine (NORVASC) 10 MG tablet, Take 1 tablet (10 mg total) by mouth daily. Marland Kitchen.  olmesartan (BENICAR) 20 MG tablet, Take 1 tablet (20 mg total) by mouth daily. .  rosuvastatin (CRESTOR) 10 MG tablet, Take 1 tablet (10 mg total) by mouth daily.   Current Outpatient Medications (Analgesics):  .  aspirin-acetaminophen-caffeine (EXCEDRIN MIGRAINE) 250-250-65 MG tablet, Take 2 tablets by mouth every 6 (six) hours as needed for headache. Marland Kitchen.  HYDROcodone-acetaminophen (NORCO) 5-325 MG tablet, Take 1 tablet by mouth every 6 (six) hours as needed for moderate pain. .Marland Kitchen  ibuprofen (ADVIL,MOTRIN) 200 MG tablet, Take 800 mg by mouth every 6 (six) hours as needed. .  meloxicam (MOBIC) 15 MG tablet, Take 1 tablet (15 mg total) by mouth daily. .  naproxen sodium (ALEVE) 220 MG tablet, Take 440 mg by mouth 2 (two) times daily as needed.   Current Outpatient Medications (Other):  Marland Kitchen  Diclofenac Sodium (PENNSAID) 2 % SOLN, Place 2 g onto the skin 2 (two) times daily. Marland Kitchen  gabapentin (NEURONTIN) 100 MG capsule, Take 2 capsules (200 mg total) by mouth at bedtime. .  topiramate (TOPAMAX) 25 MG tablet, Take 1 tablet (25 mg total) by mouth daily.    Past medical history, social, surgical and family history all reviewed in electronic medical record.  No pertanent  information unless stated regarding to the chief complaint.   Review of Systems:  No headache, visual changes, nausea, vomiting, diarrhea, constipation, dizziness, abdominal pain, skin rash, fevers, chills, night sweats, weight loss, swollen lymph nodes,, chest pain, shortness of breath, mood changes.  Aches, joint swelling body aches  Objective  Blood pressure 110/80, pulse 81, height 5\' 7"  (1.702 m), weight 171 lb (77.6 kg), SpO2 98 %.    General: No apparent distress alert and oriented x3 mood and affect normal, dressed appropriately.  HEENT: Pupils equal, extraocular movements intact  Respiratory: Patient's speak in full sentences and does not appear short of breath  Cardiovascular: No lower extremity edema, non tender, no erythema  Skin: Warm dry intact with no signs of infection or rash on extremities or on axial skeleton.  Abdomen: Soft nontender  Neuro: Cranial nerves II through XII are intact, neurovascularly intact in all extremities with 2+ DTRs and 2+ pulses.  Lymph: No lymphadenopathy of posterior or anterior cervical chain or axillae bilaterally.  Gait severely antalgic MSK:  tender with full range of motion and good stability and symmetric strength and tone of shoulders, elbows, wrist, hip, and ankles bilaterally.  Right knee has positive grinding.  Pain over of the patellofemoral joint noted.  Positive grinding of the patellofemoral.  Severe pain with this.  Mild pain over the medial joint line  After informed written and verbal consent, patient was seated on exam table. Right knee was prepped with alcohol swab and utilizing anterolateral approach, patient's right knee space was injected with 48mg /43mL of synvisc (sodium hyaluronate) in a prefilled syringe was injected easily into the knee through a 22-gauge needle..Patient tolerated the procedure well without immediate complications.    Impression and Recommendations:     This case required medical decision making of  moderate complexity. The above documentation has been reviewed and is accurate and complete Judi Saa, DO       Note: This dictation was prepared with Dragon dictation along with smaller phrase technology. Any transcriptional errors that result from this process are unintentional.

## 2018-09-09 ENCOUNTER — Encounter: Payer: Self-pay | Admitting: Family Medicine

## 2018-09-09 ENCOUNTER — Ambulatory Visit (INDEPENDENT_AMBULATORY_CARE_PROVIDER_SITE_OTHER): Payer: BLUE CROSS/BLUE SHIELD | Admitting: Family Medicine

## 2018-09-09 ENCOUNTER — Other Ambulatory Visit: Payer: Self-pay

## 2018-09-09 DIAGNOSIS — M1711 Unilateral primary osteoarthritis, right knee: Secondary | ICD-10-CM | POA: Diagnosis not present

## 2018-09-09 NOTE — Patient Instructions (Signed)
Good to see you  Ice is your friend Stay active Will take a month to work fully  See me again in 4-6 weeks but can do virtual visit if you want

## 2018-09-09 NOTE — Assessment & Plan Note (Signed)
Viscosupplementation given today.  Warned patient that it may not help immediately.  Discussed continuing topical anti-inflammatories, icing regimen, otherwise patient will need to consider possible MRI and surgical intervention.  Patient wants to avoid both of those at the moment.  Continue the home exercises.

## 2018-09-10 ENCOUNTER — Telehealth: Payer: Self-pay

## 2018-09-10 NOTE — Telephone Encounter (Signed)
Called and left message for patient and emailed her new info with new program and instructions for Doxy.

## 2018-09-11 ENCOUNTER — Other Ambulatory Visit: Payer: Self-pay | Admitting: Family

## 2018-09-13 ENCOUNTER — Ambulatory Visit (INDEPENDENT_AMBULATORY_CARE_PROVIDER_SITE_OTHER): Payer: BLUE CROSS/BLUE SHIELD | Admitting: Family

## 2018-09-13 ENCOUNTER — Ambulatory Visit: Payer: BLUE CROSS/BLUE SHIELD | Admitting: Family Medicine

## 2018-09-13 ENCOUNTER — Other Ambulatory Visit: Payer: Self-pay | Admitting: Family

## 2018-09-13 ENCOUNTER — Encounter: Payer: Self-pay | Admitting: Family

## 2018-09-13 ENCOUNTER — Telehealth: Payer: Self-pay

## 2018-09-13 DIAGNOSIS — G43809 Other migraine, not intractable, without status migrainosus: Secondary | ICD-10-CM

## 2018-09-13 DIAGNOSIS — E785 Hyperlipidemia, unspecified: Secondary | ICD-10-CM | POA: Diagnosis not present

## 2018-09-13 DIAGNOSIS — I10 Essential (primary) hypertension: Secondary | ICD-10-CM | POA: Diagnosis not present

## 2018-09-13 MED ORDER — TOPIRAMATE 25 MG PO TABS: 25.0000 mg | ORAL_TABLET | Freq: Every day | ORAL | 1 refills | Status: DC

## 2018-09-13 MED ORDER — OLMESARTAN MEDOXOMIL 20 MG PO TABS: 20.0000 mg | ORAL_TABLET | Freq: Every day | ORAL | 1 refills | Status: DC

## 2018-09-13 MED ORDER — AMLODIPINE BESYLATE 10 MG PO TABS: 10.0000 mg | ORAL_TABLET | Freq: Every day | ORAL | 1 refills | Status: DC

## 2018-09-13 NOTE — Telephone Encounter (Signed)
Patient called back and all 3 scripts are too expensive she said. One was $115.00, $575.00, and $110.00. Did you want to call in something different for her? I did go on good [facility] and look up the prices. If she agrees to go to [facility] or [facility] she can get all 3 at a decent price.  [facility]: 11.95 for Benicar/ 9.74 for Norvasac/19.74 for Topamax  [facility]: 24.00 for Benicar and Norvasc and 18.12 for Topamax.  If she wants to do this I think I can actually text the links to her or she can go online and print them out herself and take them to the pharmacy.

## 2018-09-13 NOTE — Telephone Encounter (Signed)
That's great information. She was supposed to have the pharmacist provide that information. Thanks for going the extra mile for the patient. Let me know which pharmacy she wants and I will send.

## 2018-09-13 NOTE — Telephone Encounter (Signed)
Pt called and asked if [name] could return her call. Please advise.

## 2018-09-13 NOTE — Telephone Encounter (Signed)
She is asking for you to call her back- ? Don't know if this is a repeat of what you have already talked to her about.

## 2018-09-13 NOTE — Telephone Encounter (Signed)
Called and left my direct number for her to call me back at.

## 2018-09-13 NOTE — Progress Notes (Signed)
[name] is a 48 y.o. female with the following history as recorded in [employer]:  Patient Active Problem List   Diagnosis Date Noted  . Patellofemoral arthritis of right knee 06/21/2018  . Cervical radiculopathy 06/21/2018  . Essential hypertension 05/14/2018  . Tobacco abuse 05/14/2018  . Migraine 05/14/2018  . Gout 05/14/2018    Current Outpatient Medications  Medication Sig Dispense Refill  . amLODipine (NORVASC) 10 MG tablet Take 1 tablet (10 mg total) by mouth daily. 90 tablet 1  . aspirin-acetaminophen-caffeine (EXCEDRIN MIGRAINE) 250-250-65 MG tablet Take 2 tablets by mouth every 6 (six) hours as needed for headache.    . Diclofenac Sodium (PENNSAID) 2 % SOLN Place 2 g onto the skin 2 (two) times daily. 112 g 3  . gabapentin (NEURONTIN) 100 MG capsule Take 2 capsules (200 mg total) by mouth at bedtime. 60 capsule 3  . HYDROcodone-acetaminophen (NORCO) 5-325 MG tablet Take 1 tablet by mouth every 6 (six) hours as needed for moderate pain. 20 tablet 0  . ibuprofen (ADVIL,MOTRIN) 200 MG tablet Take 800 mg by mouth every 6 (six) hours as needed.    . meloxicam (MOBIC) 15 MG tablet Take 1 tablet (15 mg total) by mouth daily. 30 tablet 0  . naproxen sodium (ALEVE) 220 MG tablet Take 440 mg by mouth 2 (two) times daily as needed.    Marland Kitchen. olmesartan (BENICAR) 20 MG tablet Take 1 tablet (20 mg total) by mouth daily. 90 tablet 1  . rosuvastatin (CRESTOR) 10 MG tablet Take 1 tablet (10 mg total) by mouth daily. 90 tablet 1  . topiramate (TOPAMAX) 25 MG tablet Take 1 tablet (25 mg total) by mouth daily. 180 tablet 1   No current facility-administered medications for this visit.     Allergies: Xanax [alprazolam]  Past Medical History:  Diagnosis Date  . Gout   . Hypertension   . Migraines     Past Surgical History:  Procedure Laterality Date  . ABDOMINAL HYSTERECTOMY  2008    No family history on file.  Social History   Tobacco Use  . Smoking status: Current Every Day  Smoker    Packs/day: 0.10    Types: Cigarettes  . Smokeless tobacco: Never Used  Substance Use Topics  . Alcohol use: Yes    Comment: occasional    Subjective:   I connected with [name] on 09/13/18 at  9:20 AM EDT by a video enabled telemedicine application and verified that I am speaking with the correct person using two identifiers.   I discussed the limitations of evaluation and management by telemedicine and the availability of in person appointments. The patient expressed understanding and agreed to proceed.  Follow-up on hypertension/ migraine headaches; notes that is "doing very well" and has not had any type of headache since getting on her medications; Was in the office earlier this week for follow-up with sports medicine provider and blood pressure was very well controlled at 110/80; Denies any chest pain, shortness of breath, blurred vision or headache.  Unfortunately, she has been laid off from her job due to COVID outbreak and has lost her health insurance; is concerned about costs of medications.    Objective:  There were no vitals filed for this visit.   General: Well developed, well nourished, in no acute distress  Skin : Warm and dry.  Head: Normocephalic and atraumatic  Lungs: Respirations unlabored;  Neurologic: Alert and oriented; speech intact; face symmetrical;    Assessment:  1.  Essential hypertension   2. Other migraine without status migrainosus, not intractable   3. Hyperlipidemia, unspecified hyperlipidemia type     Plan:  1. Stable; well controlled; continue same medications; she will speak to her pharmacist about costs of medications and call back with options for cost management; 2. Stable- good response to Topamax; continue daily as prescribed; 3. Patient will try taking Crestor every other day for now to help with cost of medication and extend prescription;  Plan to follow-up in office in 3 months, sooner prn.   No follow-ups on  file.  No orders of the defined types were placed in this encounter.   Requested Prescriptions    No prescriptions requested or ordered in this encounter

## 2018-12-13 ENCOUNTER — Ambulatory Visit: Payer: BLUE CROSS/BLUE SHIELD | Admitting: Family

## 2018-12-13 DIAGNOSIS — Z0289 Encounter for other administrative examinations: Secondary | ICD-10-CM

## 2018-12-16 ENCOUNTER — Ambulatory Visit (INDEPENDENT_AMBULATORY_CARE_PROVIDER_SITE_OTHER): Payer: BLUE CROSS/BLUE SHIELD | Admitting: Primary Care

## 2019-01-04 ENCOUNTER — Ambulatory Visit (HOSPITAL_COMMUNITY)
Admission: EM | Admit: 2019-01-04 | Discharge: 2019-01-04 | Disposition: A | Discharge status: Home / Self Care | Payer: BLUE CROSS/BLUE SHIELD | Attending: Family Medicine | Admitting: Family Medicine

## 2019-01-04 ENCOUNTER — Other Ambulatory Visit: Payer: Self-pay

## 2019-01-04 ENCOUNTER — Encounter (HOSPITAL_COMMUNITY): Payer: Self-pay

## 2019-01-04 DIAGNOSIS — M7651 Patellar tendinitis, right knee: Secondary | ICD-10-CM | POA: Diagnosis not present

## 2019-01-04 MED ORDER — PREDNISONE 20 MG PO TABS: ORAL_TABLET | ORAL | 0 refills | Status: DC

## 2019-01-04 MED ORDER — HYDROCODONE-ACETAMINOPHEN 5-325 MG PO TABS: 1.0000 | ORAL_TABLET | Freq: Four times a day (QID) | ORAL | 0 refills | Status: DC | PRN

## 2019-01-04 NOTE — Discharge Instructions (Signed)
You should consult a physical therapist because the patella (knee cap) is not sliding normally in its groove.  Do the exercises we talked about.

## 2019-01-04 NOTE — ED Triage Notes (Signed)
Patient presents to Urgent Care with complaints of acute on chronic exacerbation of right knee pain, chronic for 4 years but worse recently. Patient reports she has been trying to put ice on it and has stayed off the knee, pain is "20/10" pain, pt has been trying multiple otc pain meds with no relief, pt states pain is worse when it is raining.

## 2019-01-04 NOTE — ED Provider Notes (Signed)
[facility]    CSN: [id number] Arrival date & time: 01/04/19  1322     History   Chief Complaint Chief Complaint  Patient presents with  . Knee Pain    HPI [name] is a 48 y.o. female.   [facility]  Patient presents to Urgent Care with complaints of acute on chronic exacerbation of right knee pain, chronic for 4 years but worse recently. Patient reports she has been trying to put ice on it and has stayed off the knee, pain is "20/10" pain, pt has been trying multiple otc pain meds with no relief, pt states pain is worse when it is raining.  She's had a cortisone shot into the knee as well as the "gel" injection.     Past Medical History:  Diagnosis Date  . Gout   . Hypertension   . Migraines     Patient Active Problem List   Diagnosis Date Noted  . Patellofemoral arthritis of right knee 06/21/2018  . Cervical radiculopathy 06/21/2018  . Essential hypertension 05/14/2018  . Tobacco abuse 05/14/2018  . Migraine 05/14/2018  . Gout 05/14/2018    Past Surgical History:  Procedure Laterality Date  . ABDOMINAL HYSTERECTOMY  2008    OB History   No obstetric history on file.      Home Medications    Prior to Admission medications   Medication Sig Start Date End Date Taking? Authorizing Provider  amLODipine (NORVASC) 10 MG tablet Take 1 tablet (10 mg total) by mouth daily. 09/13/18   Olive BassMurray, Laura Woodruff, FNP  aspirin-acetaminophen-caffeine (EXCEDRIN MIGRAINE) [id number] MG tablet Take 2 tablets by mouth every 6 (six) hours as needed for headache.    [provider]  Diclofenac Sodium (PENNSAID) 2 % SOLN Place 2 g onto the skin 2 (two) times daily. 06/21/18   Judi SaaSmith, Zachary M, DO  gabapentin (NEURONTIN) 100 MG capsule Take 2 capsules (200 mg total) by mouth at bedtime. 06/21/18   Judi SaaSmith, Zachary M, DO  HYDROcodone-acetaminophen (NORCO) 5-325 MG tablet Take 1 tablet by mouth every 6 (six) hours as needed for moderate  pain. 01/04/19   Elvina SidleLauenstein, Bernardette Waldron, MD  olmesartan (BENICAR) 20 MG tablet Take 1 tablet (20 mg total) by mouth daily. 09/13/18   Olive BassMurray, Laura Woodruff, FNP  predniSONE (DELTASONE) 20 MG tablet Two daily with food 01/04/19   Elvina SidleLauenstein, Elantra Caprara, MD  rosuvastatin (CRESTOR) 10 MG tablet Take 1 tablet (10 mg total) by mouth daily. 06/18/18   Olive BassMurray, Laura Woodruff, FNP  topiramate (TOPAMAX) 25 MG tablet Take 1 tablet (25 mg total) by mouth daily. 09/13/18   Olive BassMurray, Laura Woodruff, FNP    Family History Family History  Problem Relation Age of Onset  . Hypertension Mother   . Healthy Father     Social History Social History   Tobacco Use  . Smoking status: Current Every Day Smoker    Packs/day: 0.10    Types: Cigarettes  . Smokeless tobacco: Never Used  . Tobacco comment: pack lasts 3 weeks  Substance Use Topics  . Alcohol use: Yes    Comment: occasional  . Drug use: No     Allergies   Xanax [alprazolam]   Review of Systems Review of Systems  Musculoskeletal: Positive for gait problem and joint swelling.     Physical Exam Triage Vital Signs ED Triage Vitals  Enc Vitals Group     BP 01/04/19 1400 129/81     Pulse Rate 01/04/19 1400 88  Resp 01/04/19 1400 16     Temp 01/04/19 1400 98.6 F (37 C)     Temp Source 01/04/19 1400 Oral     SpO2 01/04/19 1400 100 %     Weight --      Height --      Head Circumference --      Peak Flow --      Pain Score 01/04/19 1357 10     Pain Loc --      Pain Edu? --      Excl. in [place]? --    No data found.  Updated Vital Signs BP 129/81 (BP Location: Right Arm)   Pulse 88   Temp 98.6 F (37 C) (Oral)   Resp 16   SpO2 100%    Physical Exam Vitals signs and nursing note reviewed.  Constitutional:      Appearance: Normal appearance.  Eyes:     Conjunctiva/sclera: Conjunctivae normal.  Neck:     Musculoskeletal: Normal range of motion and neck supple.  Cardiovascular:     Rate and Rhythm: Normal rate.  Pulmonary:     Effort:  Pulmonary effort is normal.  Musculoskeletal: Normal range of motion.        General: Swelling present. No tenderness, deformity or signs of injury.     Comments: Right prepatellar swelling  Skin:    General: Skin is warm and dry.  Neurological:     General: No focal deficit present.     Mental Status: She is alert.  Psychiatric:        Mood and Affect: Mood normal.      UC Treatments / Results  Labs (all labs ordered are listed, but only abnormal results are displayed) Labs Reviewed - No data to display  EKG   Radiology No results found.  Procedures Procedures (including critical care time)  Medications Ordered in UC Medications - No data to display  Initial Impression / Assessment and Plan / UC Course  I have reviewed the triage vital signs and the nursing notes.  Pertinent labs & imaging results that were available during my care of the patient were reviewed by me and considered in my medical decision making (see chart for details).    Final Clinical Impressions(s) / UC Diagnoses   Final diagnoses:  Patellar tendinitis of right knee     Discharge Instructions     You should consult a physical therapist because the patella (knee cap) is not sliding normally in its groove.  Do the exercises we talked about.    ED Prescriptions    Medication Sig Dispense Auth. Provider   predniSONE (DELTASONE) 20 MG tablet Two daily with food 10 tablet Robyn Haber, MD   HYDROcodone-acetaminophen (NORCO) 5-325 MG tablet Take 1 tablet by mouth every 6 (six) hours as needed for moderate pain. 12 tablet Robyn Haber, MD     Controlled Substance Prescriptions [place] Controlled Substance Registry consulted? Not Applicable   Robyn Haber, MD 01/04/19 1445

## 2019-02-05 ENCOUNTER — Telehealth: Payer: Self-pay | Admitting: Family

## 2019-02-05 NOTE — Telephone Encounter (Signed)
RX REFILL amLODipine (NORVASC) 10 MG tablet  topiramate (TOPAMAX) 25 MG tablet  PHARMACY [id number] - [place], [address] AT [address] [phone] (Phone) [phone] (Fax

## 2019-02-07 ENCOUNTER — Other Ambulatory Visit: Payer: Self-pay | Admitting: Family

## 2019-02-07 MED ORDER — AMLODIPINE BESYLATE 10 MG PO TABS: 10.0000 mg | ORAL_TABLET | Freq: Every day | ORAL | 0 refills | Status: DC

## 2019-02-07 MED ORDER — TOPIRAMATE 25 MG PO TABS: 25.0000 mg | ORAL_TABLET | Freq: Every day | ORAL | 0 refills | Status: DC

## 2019-03-12 ENCOUNTER — Telehealth: Payer: Self-pay | Admitting: *Deleted

## 2019-03-12 NOTE — Telephone Encounter (Signed)
Pt left msg stating that she saw Dr. Tamala Julian earlier this year & received a steroid shot & then visco in her R knee. Pt stated that she recently went to UC because she was in so much pain. Pt stated that Dr. Tamala Julian prescribed her gabapentin & that is no longer working & would like to know what she should do because it is now affecting her work.

## 2019-03-13 NOTE — Telephone Encounter (Signed)
Patient is still experiencing pain. Wearing knee brace. Pain with weather change. Exercising as well. Knee is popping with certain exercises. Scheduled for Monday.

## 2019-03-13 NOTE — Telephone Encounter (Signed)
We would need to do the injections again.  She can follow up in the next couple weeks

## 2019-03-16 NOTE — Progress Notes (Signed)
[facility] [place], [place] [address] Phone: [phone] Subjective:    I'm seeing this patient by the request  of:    CC: Right knee pain  VFI:[id number]   09/09/2018 Viscosupplementation given today.  Warned patient that it may not help immediately.  Discussed continuing topical anti-inflammatories, icing regimen, otherwise patient will need to consider possible MRI and surgical intervention.  Patient wants to avoid both of those at the moment.  Continue the home exercises.  Update 03/17/2019 [name] is a 48 y.o. female coming in with complaint of right knee pain. Did have about 1 month of relief from the visco injection. Patient did go into urgent care she states since last visit. Pain increases with weather changes. Pain is now going down her leg into her lateral ankle. Patient stands at work which increases her pain. Is unable to sleep due to pain.     Past Medical History:  Diagnosis Date  . Gout   . Hypertension   . Migraines    Past Surgical History:  Procedure Laterality Date  . ABDOMINAL HYSTERECTOMY  2008   Social History   Socioeconomic History  . Marital status: Single    Spouse name: Not on file  . Number of children: Not on file  . Years of education: Not on file  . Highest education level: Not on file  Occupational History  . Not on file  Social Needs  . Financial resource strain: Not on file  . Food insecurity    Worry: Not on file    Inability: Not on file  . Transportation needs    Medical: Not on file    Non-medical: Not on file  Tobacco Use  . Smoking status: Current Every Day Smoker    Packs/day: 0.10    Types: Cigarettes  . Smokeless tobacco: Never Used  . Tobacco comment: pack lasts 3 weeks  Substance and Sexual Activity  . Alcohol use: Yes    Comment: occasional  . Drug use: No  . Sexual activity: Not on file  Lifestyle  . Physical activity    Days per week: Not on file   Minutes per session: Not on file  . Stress: Not on file  Relationships  . Social Herbalist on phone: Not on file    Gets together: Not on file    Attends religious service: Not on file    Active member of club or organization: Not on file    Attends meetings of clubs or organizations: Not on file    Relationship status: Not on file  Other Topics Concern  . Not on file  Social History Narrative  . Not on file   Allergies  Allergen Reactions  . Xanax [Alprazolam] Itching   Family History  Problem Relation Age of Onset  . Hypertension Mother   . Healthy Father     Current Outpatient Medications (Endocrine & Metabolic):  .  predniSONE (DELTASONE) 20 MG tablet, Two daily with food  Current Outpatient Medications (Cardiovascular):  .  amLODipine (NORVASC) 10 MG tablet, Take 1 tablet (10 mg total) by mouth daily. Marland Kitchen  olmesartan (BENICAR) 20 MG tablet, Take 1 tablet (20 mg total) by mouth daily. .  rosuvastatin (CRESTOR) 10 MG tablet, Take 1 tablet (10 mg total) by mouth daily.   Current Outpatient Medications (Analgesics):  .  aspirin-acetaminophen-caffeine (EXCEDRIN MIGRAINE) 250-250-65 MG tablet, Take 2 tablets by mouth every 6 (six) hours as  needed for headache. Marland Kitchen  HYDROcodone-acetaminophen (NORCO) 5-325 MG tablet, Take 1 tablet by mouth every 6 (six) hours as needed for moderate pain. .  naproxen (NAPROSYN) 500 MG tablet, Take 1 tablet (500 mg total) by mouth 2 (two) times daily as needed.   Current Outpatient Medications (Other):  Marland Kitchen  Diclofenac Sodium (PENNSAID) 2 % SOLN, Place 2 g onto the skin 2 (two) times daily. Marland Kitchen  gabapentin (NEURONTIN) 100 MG capsule, Take 2 capsules (200 mg total) by mouth at bedtime. .  topiramate (TOPAMAX) 25 MG tablet, Take 1 tablet (25 mg total) by mouth daily.    Past medical history, social, surgical and family history all reviewed in electronic medical record.  No pertanent information unless stated regarding to the chief  complaint.   Review of Systems:  No headache, visual changes, nausea, vomiting, diarrhea, constipation, dizziness, abdominal pain, skin rash, fevers, chills, night sweats, weight loss, swollen lymph nodes, body aches, joint swelling, muscle aches, chest pain, shortness of breath, mood changes.   Objective  Blood pressure 102/62, pulse 76, height 5\' 7"  (1.702 m), weight 175 lb 12.8 oz (79.7 kg), SpO2 99 %.    General: No apparent distress alert and oriented x3 mood and affect normal, dressed appropriately.  HEENT: Pupils equal, extraocular movements intact  Respiratory: Patient's speak in full sentences and does not appear short of breath  Cardiovascular: No lower extremity edema, non tender, no erythema  Skin: Warm dry intact with no signs of infection or rash on extremities or on axial skeleton.  Abdomen: Soft nontender  Neuro: Cranial nerves II through XII are intact, neurovascularly intact in all extremities with 2+ DTRs and 2+ pulses.  Lymph: No lymphadenopathy of posterior or anterior cervical chain or axillae bilaterally.  Gait normal with good balance and coordination.  MSK:  Non tender with full range of motion and good stability and symmetric strength and tone of shoulders, elbows, wrist, hip, kee and ankles bilaterally.  Right knee exam still has significant and some tenderness to palpation diffusely.  Patient does have lateral tracking of the patella with a positive grind test.  Patient does have some increasing instability and positive McMurray's.  Lacks last 2 degrees of extension which is less than previous exam    Impression and Recommendations:     This case required medical decision making of moderate complexity. The above documentation has been reviewed and is accurate and complete , DO       Note: This dictation was prepared with Dragon dictation along with smaller phrase technology. Any transcriptional errors that result from this process are  unintentional.

## 2019-03-17 ENCOUNTER — Ambulatory Visit (INDEPENDENT_AMBULATORY_CARE_PROVIDER_SITE_OTHER): Payer: BLUE CROSS/BLUE SHIELD | Admitting: Family Medicine

## 2019-03-17 ENCOUNTER — Encounter: Payer: Self-pay | Admitting: Family Medicine

## 2019-03-17 ENCOUNTER — Other Ambulatory Visit: Payer: Self-pay

## 2019-03-17 VITALS — BP 102/62 | HR 76 | Ht 67.0 in | Wt 175.8 lb

## 2019-03-17 DIAGNOSIS — G8929 Other chronic pain: Secondary | ICD-10-CM

## 2019-03-17 DIAGNOSIS — M1711 Unilateral primary osteoarthritis, right knee: Secondary | ICD-10-CM

## 2019-03-17 DIAGNOSIS — M25561 Pain in right knee: Secondary | ICD-10-CM

## 2019-03-17 MED ORDER — NAPROXEN 500 MG PO TABS: 500.0000 mg | ORAL_TABLET | Freq: Two times a day (BID) | ORAL | 0 refills | Status: DC | PRN

## 2019-03-17 NOTE — Assessment & Plan Note (Signed)
Patient continues to have difficulty.  Patient has some instability in the knee.  Has been doing bracing, exercises, viscosupplementation and steroid injection with minimal improvement.  Patient does have a past medical history significant for gout.  At this point I would like an MRI to further evaluate for any meniscal pathology that could be contributing with patient not making any improvement over the course the last 6 months and even longer..  Patient will have the advanced imaging I would consider surgical intervention if necessary.  Follow-up with me again after imaging.  Spent  25 minutes with patient face-to-face and had greater than 50% of counseling including as described above in assessment and plan.

## 2019-03-17 NOTE — Patient Instructions (Signed)
Naproxen as needed for pain [facility] for MRI [phone] We will be in touch regarding results

## 2019-03-29 ENCOUNTER — Ambulatory Visit
Admission: RE | Admit: 2019-03-29 | Discharge: 2019-03-29 | Disposition: A | Discharge status: Home / Self Care | Payer: BC Managed Care – PPO | Source: Ambulatory Visit | Attending: Family Medicine | Admitting: Family Medicine

## 2019-03-29 ENCOUNTER — Other Ambulatory Visit: Payer: Self-pay

## 2019-03-29 DIAGNOSIS — G8929 Other chronic pain: Secondary | ICD-10-CM

## 2019-03-29 DIAGNOSIS — M1711 Unilateral primary osteoarthritis, right knee: Secondary | ICD-10-CM | POA: Diagnosis not present

## 2019-04-01 ENCOUNTER — Encounter: Payer: Self-pay | Admitting: Family Medicine

## 2019-04-02 ENCOUNTER — Telehealth: Payer: Self-pay | Admitting: *Deleted

## 2019-04-02 NOTE — Telephone Encounter (Signed)
Pt left msg requesting a call back to discuss mri results.

## 2019-04-02 NOTE — Telephone Encounter (Signed)
Spoke with patient and scheduled a virtual visit with Dr. Tamala Julian to discuss next steps. Patient declines in office appointment for another injection as she does not want to have any more injections.

## 2019-04-03 ENCOUNTER — Ambulatory Visit (INDEPENDENT_AMBULATORY_CARE_PROVIDER_SITE_OTHER): Payer: BC Managed Care – PPO | Admitting: Family Medicine

## 2019-04-03 ENCOUNTER — Encounter: Payer: Self-pay | Admitting: Family Medicine

## 2019-04-03 DIAGNOSIS — M1711 Unilateral primary osteoarthritis, right knee: Secondary | ICD-10-CM

## 2019-04-03 MED ORDER — MELOXICAM 15 MG PO TABS: 15.0000 mg | ORAL_TABLET | Freq: Every day | ORAL | 0 refills | Status: DC

## 2019-04-03 NOTE — Progress Notes (Signed)
Virtual Visit via Video Note  I connected with Julien Berryman Wetsel on 04/03/19 at  3:15 PM EST by a video enabled telemedicine application and verified that I am speaking with the correct person using two identifiers.  Location: Patient: at home   Provider: in office setting    I discussed the limitations of evaluation and management by telemedicine and the availability of in person appointments. The patient expressed understanding and agreed to proceed.  History of Present Illness: 03/17/2019 Patient continues to have difficulty.  Patient has some instability in the knee.  Has been doing bracing, exercises, viscosupplementation and steroid injection with minimal improvement.  Patient does have a past medical history significant for gout.  At this point I would like an MRI to further evaluate for any meniscal pathology that could be contributing with patient not making any improvement over the course the last 6 months and even longer..  Patient will have the advanced imaging I would consider surgical intervention if necessary.  Follow-up with me again after imaging.  Spent  25 minutes with patient face-to-face and had greater than 50% of counseling including as described above in assessment and plan.  MRI IMPRESSION 03/29/2019 1. Intact ligamentous structures and no acute bony findings. 2. No meniscal tears. 3. Mild tricompartmental degenerative chondrosis but no full-thickness cartilage defects or osteochondral lesion. 4. No joint effusion or Baker's cyst.   Update 04/03/2019 Patient continues to have knee pain. Patient wants to discuss next steps based on MRI result as she does not want to have any more injections.   Observations/Objective: Alert and oriented x3, laying down in her bedroom, was able to get up during the interview and walk around without any significant pain she stated.   Assessment and Plan: Patient with mild tricompartment degenerative changes but otherwise fairly  unremarkable on MRI.  Patient wants to continue conservative therapy, anti-inflammatories prescribed.  Patient wants to avoid its injections.  No findings that would be consistent with surgery.  Follow-up with me again in 4 to 6 weeks   Follow Up Instructions: 4 weeks    I discussed the assessment and treatment plan with the patient. The patient was provided an opportunity to ask questions and all were answered. The patient agreed with the plan and demonstrated an understanding of the instructions.   The patient was advised to call back or seek an in-person evaluation if the symptoms worsen or if the condition fails to improve as anticipated.  I provided 26 minutes of face-to-face time during this encounter.   Lyndal Pulley, DO

## 2019-04-30 ENCOUNTER — Ambulatory Visit (INDEPENDENT_AMBULATORY_CARE_PROVIDER_SITE_OTHER): Payer: BC Managed Care – PPO | Admitting: Family

## 2019-04-30 ENCOUNTER — Encounter: Payer: Self-pay | Admitting: Family

## 2019-04-30 DIAGNOSIS — M1711 Unilateral primary osteoarthritis, right knee: Secondary | ICD-10-CM | POA: Diagnosis not present

## 2019-04-30 MED ORDER — CELECOXIB 100 MG PO CAPS: 100.0000 mg | ORAL_CAPSULE | Freq: Two times a day (BID) | ORAL | 1 refills | Status: DC

## 2019-04-30 NOTE — Progress Notes (Signed)
[name] is a 48 y.o. female with the following history as recorded in EpicCare:  Patient Active Problem List   Diagnosis Date Noted  . Patellofemoral arthritis of right knee 06/21/2018  . Cervical radiculopathy 06/21/2018  . Essential hypertension 05/14/2018  . Tobacco abuse 05/14/2018  . Migraine 05/14/2018  . Gout 05/14/2018    Current Outpatient Medications  Medication Sig Dispense Refill  . amLODipine (NORVASC) 10 MG tablet Take 1 tablet (10 mg total) by mouth daily. 90 tablet 0  . aspirin-acetaminophen-caffeine (EXCEDRIN MIGRAINE) 250-250-65 MG tablet Take 2 tablets by mouth every 6 (six) hours as needed for headache.    . celecoxib (CELEBREX) 100 MG capsule Take 1 capsule (100 mg total) by mouth 2 (two) times daily. 60 capsule 1  . HYDROcodone-acetaminophen (NORCO) 5-325 MG tablet Take 1 tablet by mouth every 6 (six) hours as needed for moderate pain. 12 tablet 0  . rosuvastatin (CRESTOR) 10 MG tablet Take 1 tablet (10 mg total) by mouth daily. 90 tablet 1  . topiramate (TOPAMAX) 25 MG tablet Take 1 tablet (25 mg total) by mouth daily. 90 tablet 0   No current facility-administered medications for this visit.     Allergies: Xanax [alprazolam]  Past Medical History:  Diagnosis Date  . Gout   . Hypertension   . Migraines     Past Surgical History:  Procedure Laterality Date  . ABDOMINAL HYSTERECTOMY  2008    Family History  Problem Relation Age of Onset  . Hypertension Mother   . Healthy Father     Social History   Tobacco Use  . Smoking status: Current Every Day Smoker    Packs/day: 0.10    Types: Cigarettes  . Smokeless tobacco: Never Used  . Tobacco comment: pack lasts 3 weeks  Substance Use Topics  . Alcohol use: Yes    Comment: occasional    Subjective:    I connected with [name] on 04/30/19 at 11:40 AM EST by a video enabled telemedicine application and verified that I am speaking with the correct person using two identifiers.  Provider in office/ patient is at home; provider and patient are only 2 people on video call.    I discussed the limitations of evaluation and management by telemedicine and the availability of in person appointments. The patient expressed understanding and agreed to proceed.  Wants to discuss options to treat her chronic right knee pain; has been working with sports medicine and MRI has been done; patient is adamant that she does not want to try further injections; limited relief with Naproxen and Celebrex; hoping to get a new job that involves standing for extended periods of time.     Objective:  There were no vitals filed for this visit.  General: Well developed, well nourished, in no acute distress  Lungs: Respirations unlabored;  Neurologic: Alert and oriented; speech intact; face symmetrical; moves all extremities well; CNII-XII intact without focal deficit   Assessment:  1. Patellofemoral arthritis of right knee     Plan:  Trial of Celebrex 100 mg bid prn; refer to PT; will need to go back to sports medicine if symptoms persist.   No follow-ups on file.  Orders Placed This Encounter  Procedures  . Ambulatory referral to Physical Therapy    Referral Priority:   Routine    Referral Type:   Physical Medicine    Referral Reason:   Specialty Services Required    Requested Specialty:   Physical Therapy  Number of Visits Requested:   1    Requested Prescriptions   Signed Prescriptions Disp Refills  . celecoxib (CELEBREX) 100 MG capsule 60 capsule 1    Sig: Take 1 capsule (100 mg total) by mouth 2 (two) times daily.

## 2019-05-27 ENCOUNTER — Telehealth: Payer: Self-pay | Admitting: Family

## 2019-05-27 NOTE — Telephone Encounter (Signed)
Pt called in for assistance. Pt was prescribed celecoxib (CELEBREX) 100 MG capsule and states that medication is causing her to have headaches. Pt would like to discuss further.   Please assist.

## 2019-05-28 ENCOUNTER — Other Ambulatory Visit: Payer: Self-pay | Admitting: Internal Medicine

## 2019-05-28 DIAGNOSIS — M5431 Sciatica, right side: Secondary | ICD-10-CM | POA: Diagnosis not present

## 2019-05-28 DIAGNOSIS — M2560 Stiffness of unspecified joint, not elsewhere classified: Secondary | ICD-10-CM | POA: Diagnosis not present

## 2019-05-28 DIAGNOSIS — M1711 Unilateral primary osteoarthritis, right knee: Secondary | ICD-10-CM

## 2019-05-28 DIAGNOSIS — R262 Difficulty in walking, not elsewhere classified: Secondary | ICD-10-CM | POA: Diagnosis not present

## 2019-05-28 DIAGNOSIS — M25561 Pain in right knee: Secondary | ICD-10-CM | POA: Diagnosis not present

## 2019-05-28 MED ORDER — METHYLPREDNISOLONE 4 MG PO TBPK: ORAL_TABLET | ORAL | 0 refills | Status: AC

## 2019-05-28 NOTE — Telephone Encounter (Signed)
Spoke with patient and info given 

## 2019-05-28 NOTE — Telephone Encounter (Signed)
RX sent to [facility]  TJ 

## 2019-05-29 ENCOUNTER — Telehealth: Payer: Self-pay | Admitting: Family

## 2019-05-29 NOTE — Telephone Encounter (Signed)
Per routine refills 

## 2019-05-29 NOTE — Telephone Encounter (Signed)
rx refill amLODipine (NORVASC) 10 MG tablet topiramate (TOPAMAX) 25 MG tablet    Pt is request 90 days, pt will not have insurance in [place].    [facility] [place], [address] Phone:  [phone]  Fax:  [phone]

## 2019-06-03 MED ORDER — TOPIRAMATE 25 MG PO TABS: 25.0000 mg | ORAL_TABLET | Freq: Every day | ORAL | 0 refills | Status: DC

## 2019-06-03 MED ORDER — AMLODIPINE BESYLATE 10 MG PO TABS: 10.0000 mg | ORAL_TABLET | Freq: Every day | ORAL | 0 refills | Status: DC

## 2019-06-03 NOTE — Addendum Note (Signed)
Addended by: Milus Mallick on: 06/03/2019 11:42 AM   Modules accepted: Orders

## 2019-06-04 ENCOUNTER — Other Ambulatory Visit: Payer: Self-pay | Admitting: Family

## 2019-06-12 ENCOUNTER — Other Ambulatory Visit: Payer: Self-pay | Admitting: Internal Medicine

## 2019-07-23 ENCOUNTER — Telehealth: Payer: Self-pay

## 2019-07-23 NOTE — Telephone Encounter (Signed)
Spoke with patient today. Appointment made for next Wed on her day off. She will continue to take Benadryl in the meantime and cancel if she is better.

## 2019-07-23 NOTE — Telephone Encounter (Signed)
Patient calling and spoke with [facility] on : 07/22/2019 5:07:06 PM States that she she takes blood pressure pills and she has eaten a grapefruit and has hives all over her neck that are red and itchy. No trouble swallowing or breathing. no fever.  Advised to follow up with PCP in 3 days

## 2019-07-28 ENCOUNTER — Telehealth: Payer: Self-pay

## 2019-07-28 ENCOUNTER — Other Ambulatory Visit: Payer: Self-pay | Admitting: Family

## 2019-07-28 MED ORDER — TOPIRAMATE 25 MG PO TABS: 25.0000 mg | ORAL_TABLET | Freq: Every day | ORAL | 0 refills | Status: DC

## 2019-07-28 NOTE — Telephone Encounter (Signed)
Medication Refill - Medication: topiramate (TOPAMAX) 25 MG tablet    Has the patient contacted their pharmacy? Yes.   (Agent: If no, request that the patient contact the pharmacy for the refill.) (Agent: If yes, when and what did the pharmacy advise?)  Preferred Pharmacy (with phone number or street name): [id number] - [place],  - [address] AT [address]  Agent: Please be advised that RX refills may take up to 3 business days. We ask that you follow-up with your pharmacy.

## 2019-07-30 ENCOUNTER — Ambulatory Visit: Payer: BC Managed Care – PPO | Admitting: Family

## 2019-08-22 ENCOUNTER — Ambulatory Visit: Payer: BC Managed Care – PPO | Admitting: Family

## 2019-08-22 ENCOUNTER — Other Ambulatory Visit: Payer: Self-pay | Admitting: Family

## 2019-08-22 ENCOUNTER — Other Ambulatory Visit: Payer: Self-pay

## 2019-08-22 ENCOUNTER — Ambulatory Visit (INDEPENDENT_AMBULATORY_CARE_PROVIDER_SITE_OTHER): Payer: Self-pay | Admitting: Family

## 2019-08-22 ENCOUNTER — Encounter: Payer: Self-pay | Admitting: Family

## 2019-08-22 VITALS — BP 130/84 | HR 91 | Temp 98.1°F | Ht 67.0 in | Wt 167.4 lb

## 2019-08-22 DIAGNOSIS — R5383 Other fatigue: Secondary | ICD-10-CM

## 2019-08-22 DIAGNOSIS — G43809 Other migraine, not intractable, without status migrainosus: Secondary | ICD-10-CM

## 2019-08-22 MED ORDER — SERTRALINE HCL 25 MG PO TABS: ORAL_TABLET | ORAL | 1 refills | Status: DC

## 2019-08-22 MED ORDER — METRONIDAZOLE 500 MG PO TABS: 500.0000 mg | ORAL_TABLET | Freq: Two times a day (BID) | ORAL | 0 refills | Status: DC

## 2019-08-22 MED ORDER — TOPIRAMATE 25 MG PO TABS: 25.0000 mg | ORAL_TABLET | Freq: Two times a day (BID) | ORAL | 1 refills | Status: DC

## 2019-08-22 NOTE — Patient Instructions (Signed)
Ask about EAP program through [facility]

## 2019-08-22 NOTE — Progress Notes (Signed)
[name] is a 49 y.o. female with the following history as recorded in EpicCare:  Patient Active Problem List   Diagnosis Date Noted  . Patellofemoral arthritis of right knee 06/21/2018  . Cervical radiculopathy 06/21/2018  . Essential hypertension 05/14/2018  . Tobacco abuse 05/14/2018  . Migraine 05/14/2018  . Gout 05/14/2018    Current Outpatient Medications  Medication Sig Dispense Refill  . amLODipine (NORVASC) 10 MG tablet Take 1 tablet (10 mg total) by mouth daily. 90 tablet 0  . aspirin-acetaminophen-caffeine (EXCEDRIN MIGRAINE) 250-250-65 MG tablet Take 2 tablets by mouth every 6 (six) hours as needed for headache.    . celecoxib (CELEBREX) 100 MG capsule Take 1 capsule (100 mg total) by mouth 2 (two) times daily. 60 capsule 1  . HYDROcodone-acetaminophen (NORCO) 5-325 MG tablet Take 1 tablet by mouth every 6 (six) hours as needed for moderate pain. 12 tablet 0  . rosuvastatin (CRESTOR) 10 MG tablet TAKE 1 TABLET(10 MG) BY MOUTH DAILY 90 tablet 1  . topiramate (TOPAMAX) 25 MG tablet Take 1 tablet (25 mg total) by mouth 2 (two) times daily. 180 tablet 1  . metroNIDAZOLE (FLAGYL) 500 MG tablet Take 1 tablet (500 mg total) by mouth 2 (two) times daily. 14 tablet 0  . sertraline (ZOLOFT) 25 MG tablet Take 1 tablet daily x 1 week; increase to 2 per day; 60 tablet 1   No current facility-administered medications for this visit.    Allergies: Xanax [alprazolam]  Past Medical History:  Diagnosis Date  . Gout   . Hypertension   . Migraines     Past Surgical History:  Procedure Laterality Date  . ABDOMINAL HYSTERECTOMY  2008    Family History  Problem Relation Age of Onset  . Hypertension Mother   . Healthy Father     Social History   Tobacco Use  . Smoking status: Current Every Day Smoker    Packs/day: 0.10    Types: Cigarettes  . Smokeless tobacco: Never Used  . Tobacco comment: pack lasts 3 weeks  Substance Use Topics  . Alcohol use: Yes    Comment:  occasional    Subjective:  Patient has been having increased problems with depression x 2 months; notes that she is "just lying in the bed crying"/ feels like she has no purpose; has broken up with her boyfriend ( together for 8.5 years) in December 2020- "out of the blue." Mother moved to [facility] and both her children have moved out;  Notes she does not have any insurance- concerned about cost of visit; cannot afford to pay for labs today;    Objective:  Vitals:   08/22/19 0934  BP: 130/84  Pulse: 91  Temp: 98.1 F (36.7 C)  TempSrc: Oral  SpO2: 98%  Weight: 167 lb 6.4 oz (75.9 kg)  Height: '5\' 7"'$  (1.702 m)    General: Well developed, well nourished, tearful in office Skin : Warm and dry.  Head: Normocephalic and atraumatic  Lungs: Respirations unlabored;  Neurologic: Alert and oriented; speech intact; face symmetrical; moves all extremities well; CNII-XII intact without focal deficit   Assessment:  1. Other fatigue   2. Other migraine without status migrainosus, not intractable     Plan:  Situational anxiety and depression; suspect this is causing secondary headaches, insomnia; trial of Zoloft- take as directed; encouraged to discuss EAP with her employer; labs deferred today due to cost; follow-up in 1 month, sooner prn.   Refill updated on Topamax- per patient, she  has been taking twice a day since it was started and needed prescription updated;   Return in about 1 month (around 09/22/2019).  Orders Placed This Encounter  Procedures  . CBC with Differential/Platelet  . Comp Met (CMET)  . TSH    Requested Prescriptions   Signed Prescriptions Disp Refills  . topiramate (TOPAMAX) 25 MG tablet 180 tablet 1    Sig: Take 1 tablet (25 mg total) by mouth 2 (two) times daily.  . sertraline (ZOLOFT) 25 MG tablet 60 tablet 1    Sig: Take 1 tablet daily x 1 week; increase to 2 per day;  . metroNIDAZOLE (FLAGYL) 500 MG tablet 14 tablet 0    Sig: Take 1 tablet (500 mg total) by mouth 2  (two) times daily.

## 2019-09-10 ENCOUNTER — Other Ambulatory Visit: Payer: Self-pay | Admitting: Family

## 2019-09-18 ENCOUNTER — Telehealth (INDEPENDENT_AMBULATORY_CARE_PROVIDER_SITE_OTHER): Payer: Self-pay | Admitting: Internal Medicine

## 2019-09-18 ENCOUNTER — Encounter: Payer: Self-pay | Admitting: Internal Medicine

## 2019-09-18 DIAGNOSIS — R05 Cough: Secondary | ICD-10-CM

## 2019-09-18 DIAGNOSIS — R059 Cough, unspecified: Secondary | ICD-10-CM

## 2019-09-18 MED ORDER — HYDROCODONE-HOMATROPINE 5-1.5 MG/5ML PO SYRP: 5.0000 mL | ORAL_SOLUTION | Freq: Two times a day (BID) | ORAL | 0 refills | Status: DC | PRN

## 2019-09-18 MED ORDER — DOXYCYCLINE HYCLATE 100 MG PO TABS: 100.0000 mg | ORAL_TABLET | Freq: Two times a day (BID) | ORAL | 0 refills | Status: DC

## 2019-09-18 NOTE — Progress Notes (Signed)
Virtual Visit via Audio Note  I connected with [name] on 09/18/19 at  4:00 PM EDT by an audio-only enabled telemedicine application and verified that I am speaking with the correct person using two identifiers.  The patient and the provider were at separate locations throughout the entire encounter.   I discussed the limitations of evaluation and management by telemedicine and the availability of in person appointments. The patient expressed understanding and agreed to proceed. The patient and the provider were the only parties present for the visit unless noted in HPI below.  History of Present Illness: The patient is a 49 y.o. female with visit for cough. Started about 2-3 weeks ago after getting covid-19 vaccine. She had more symptoms at onset of fevers chills, cough, drainage. Now all other symptoms are gone. She is a current smoker and still smoking some. Denies fevers or chills. Some SOB while coughing and ribs getting sore from coughing. She is coughing more at night and with exertion. Has tried otc cough and cold medications without relief. Overall it is worsening.  Observations/Objective: Voice strong, coughing during visit no dyspnea, mental status is A and O times 3  Assessment and Plan: See problem oriented charting  Follow Up Instructions: Rx hycodan cough syrup and doxycycline.   Visit time 7 minutes in non-face to face communication with patient and coordination of care  I discussed the assessment and treatment plan with the patient. The patient was provided an opportunity to ask questions and all were answered. The patient agreed with the plan and demonstrated an understanding of the instructions.   The patient was advised to call back or seek an in-person evaluation if the symptoms worsen or if the condition fails to improve as anticipated.  Myrlene Broker, MD

## 2019-09-19 ENCOUNTER — Telehealth: Payer: Self-pay

## 2019-09-19 DIAGNOSIS — R05 Cough: Secondary | ICD-10-CM | POA: Insufficient documentation

## 2019-09-19 DIAGNOSIS — R059 Cough, unspecified: Secondary | ICD-10-CM | POA: Insufficient documentation

## 2019-09-19 MED ORDER — HYDROCODONE-HOMATROPINE 5-1.5 MG/5ML PO SYRP: 5.0000 mL | ORAL_SOLUTION | Freq: Two times a day (BID) | ORAL | 0 refills | Status: DC | PRN

## 2019-09-19 NOTE — Telephone Encounter (Signed)
New message e  The patient calling [facility] on [address] did not have cough medication   Asking can medication be route to another pharmacy  1.Medication Requested:HYDROcodone-homatropine (HYCODAN) 5-1.5 MG/5ML syrup  2. Pharmacy (Name, Street, [place]): [facility] off [address]   3. On Med List: Yes   4. Last Visit with PCP: 4.22.2021   5. Next visit date with PCP: 4.30.2021    Agent: Please be advised that RX refills may take up to 3 business days. We ask that you follow-up with your pharmacy.

## 2019-09-19 NOTE — Assessment & Plan Note (Signed)
Likely related to cold symptoms after vaccination. Rx hycodan after review of [place] narcotic database. Rx doxycycline given smoking status and timeline.

## 2019-09-19 NOTE — Telephone Encounter (Signed)
Vernona Rieger sent rx for Hycodan cough syrup which [facility] did not have. Pt is wanting rx sent to [facility] instead. Vernona Rieger is out of the office today can you resend to [facility].Marland KitchenRaechel Chute

## 2019-09-26 ENCOUNTER — Ambulatory Visit: Payer: Self-pay | Admitting: Family

## 2019-11-17 ENCOUNTER — Encounter (HOSPITAL_COMMUNITY): Payer: Self-pay

## 2019-11-17 ENCOUNTER — Other Ambulatory Visit: Payer: Self-pay

## 2019-11-17 ENCOUNTER — Ambulatory Visit (HOSPITAL_COMMUNITY)
Admission: RE | Admit: 2019-11-17 | Discharge: 2019-11-17 | Disposition: A | Discharge status: Home / Self Care | Payer: Self-pay | Source: Ambulatory Visit | Attending: Family Medicine | Admitting: Family Medicine

## 2019-11-17 VITALS — BP 130/70 | HR 89 | Temp 98.4°F | Resp 16

## 2019-11-17 DIAGNOSIS — R300 Dysuria: Secondary | ICD-10-CM

## 2019-11-17 DIAGNOSIS — N390 Urinary tract infection, site not specified: Secondary | ICD-10-CM

## 2019-11-17 DIAGNOSIS — N3000 Acute cystitis without hematuria: Secondary | ICD-10-CM

## 2019-11-17 LAB — POCT URINALYSIS DIP (DEVICE)
Bilirubin Urine: NEGATIVE
Glucose, UA: 100 mg/dL — AB
Leukocytes,Ua: NEGATIVE
Nitrite: POSITIVE — AB
Protein, ur: 100 mg/dL — AB
Specific Gravity, Urine: 1.02 (ref 1.005–1.030)
Urobilinogen, UA: 2 mg/dL — ABNORMAL HIGH (ref 0.0–1.0)
pH: 7 (ref 5.0–8.0)

## 2019-11-17 MED ORDER — FLUCONAZOLE 150 MG PO TABS
ORAL_TABLET | ORAL | 0 refills | Status: DC
Start: 2019-11-17 — End: 2019-12-09

## 2019-11-17 MED ORDER — NITROFURANTOIN MONOHYD MACRO 100 MG PO CAPS: 100.0000 mg | ORAL_CAPSULE | Freq: Two times a day (BID) | ORAL | 0 refills | Status: DC

## 2019-11-17 NOTE — ED Triage Notes (Signed)
Pt presents with urinary frequency and burning during urination X 2 days. 

## 2019-11-17 NOTE — Discharge Instructions (Addendum)
You may have a urinary tract infection. We are going to culture your urine and will call you as soon as we have the results.   Drink plenty of water, 8-10 glasses per day.   You may take AZO over the counter for painful urination.  Follow up with your primary care provider as needed.   Go to the Emergency Department if you experience severe pain, shortness of breath, high fever, or other concerns.   

## 2019-11-20 LAB — URINE CULTURE: Culture: 10000 — AB

## 2019-11-23 NOTE — ED Provider Notes (Signed)
[facility]   CC: UTI  SUBJECTIVE:  [name] is a 49 y.o. female who complains of urinary frequency, urgency and dysuria for the past 2 days. Patient denies a precipitating event, recent sexual encounter, excessive caffeine intake. Localizes the pain to the lower abdomen. Pain is intermittent and describes it as sharp. Has not tried OTC medications without relief. Symptoms are made worse with urination. Admits to similar symptoms in the past. Denies fever, chills, nausea, vomiting, abdominal pain, flank pain, abnormal vaginal discharge or bleeding, hematuria.    LMP: No LMP recorded. Patient has had a hysterectomy.  ROS: As in HPI.  All other pertinent ROS negative.     Past Medical History:  Diagnosis Date  . Gout   . Hypertension   . Migraines    Past Surgical History:  Procedure Laterality Date  . ABDOMINAL HYSTERECTOMY  2008   Allergies  Allergen Reactions  . Xanax [Alprazolam] Itching   No current facility-administered medications on file prior to encounter.   Current Outpatient Medications on File Prior to Encounter  Medication Sig Dispense Refill  . amLODipine (NORVASC) 10 MG tablet Take 1 tablet (10 mg total) by mouth daily. 90 tablet 0  . aspirin-acetaminophen-caffeine (EXCEDRIN MIGRAINE) 250-250-65 MG tablet Take 2 tablets by mouth every 6 (six) hours as needed for headache.    . celecoxib (CELEBREX) 100 MG capsule Take 1 capsule (100 mg total) by mouth 2 (two) times daily. 60 capsule 1  . doxycycline (VIBRA-TABS) 100 MG tablet Take 1 tablet (100 mg total) by mouth 2 (two) times daily. 14 tablet 0  . HYDROcodone-homatropine (HYCODAN) 5-1.5 MG/5ML syrup Take 5-10 mLs by mouth 2 (two) times daily as needed for cough. 100 mL 0  . rosuvastatin (CRESTOR) 10 MG tablet TAKE 1 TABLET(10 MG) BY MOUTH DAILY 90 tablet 1  . sertraline (ZOLOFT) 25 MG tablet Take 1 tablet daily x 1 week; increase to 2 per day; 60 tablet 1  . topiramate (TOPAMAX) 25 MG tablet  TAKE 1 TABLET(25 MG) BY MOUTH DAILY 90 tablet 0   Social History   Socioeconomic History  . Marital status: Single    Spouse name: Not on file  . Number of children: Not on file  . Years of education: Not on file  . Highest education level: Not on file  Occupational History  . Not on file  Tobacco Use  . Smoking status: Current Every Day Smoker    Packs/day: 0.10    Types: Cigarettes  . Smokeless tobacco: Never Used  . Tobacco comment: pack lasts 3 weeks  Vaping Use  . Vaping Use: Never used  Substance and Sexual Activity  . Alcohol use: Yes    Comment: occasional  . Drug use: No  . Sexual activity: Not on file  Other Topics Concern  . Not on file  Social History Narrative  . Not on file   Social Determinants of Health   Financial Resource Strain:   . Difficulty of Paying Living Expenses:   Food Insecurity:   . Worried About Programme researcher, broadcasting/film/video in the Last Year:   . Barista in the Last Year:   Transportation Needs:   . Freight forwarder (Medical):   Marland Kitchen Lack of Transportation (Non-Medical):   Physical Activity:   . Days of Exercise per Week:   . Minutes of Exercise per Session:   Stress:   . Feeling of Stress :   Social Connections:   . Frequency  of Communication with Friends and Family:   . Frequency of Social Gatherings with Friends and Family:   . Attends Religious Services:   . Active Member of Clubs or Organizations:   . Attends Archivist Meetings:   Marland Kitchen Marital Status:   Intimate Partner Violence:   . Fear of Current or Ex-Partner:   . Emotionally Abused:   Marland Kitchen Physically Abused:   . Sexually Abused:    Family History  Problem Relation Age of Onset  . Hypertension Mother   . Healthy Father     OBJECTIVE:  Vitals:   11/17/19 1823  BP: 130/70  Pulse: 89  Resp: 16  Temp: 98.4 F (36.9 C)  TempSrc: Oral  SpO2: 99%   General appearance: AOx3 in no acute distress HEENT: NCAT.  Oropharynx clear.  Lungs: clear to  auscultation bilaterally without adventitious breath sounds Heart: regular rate and rhythm.  Radial pulses 2+ symmetrical bilaterally Abdomen: soft; non-distended; suprapubic tenderness; bowel sounds present; no guarding or rebound tenderness Back: no CVA tenderness Extremities: no edema; symmetrical with no gross deformities Skin: warm and dry Neurologic: Ambulates from chair to exam table without difficulty Psychological: alert and cooperative; normal mood and affect  Labs Reviewed  URINE CULTURE - Abnormal; Notable for the following components:      Result Value   Culture   (*)    Value: 10,000 COLONIES/mL ESCHERICHIA COLI 50,000 COLONIES/mL VIRIDANS STREPTOCOCCUS Standardized susceptibility testing for this organism is not available. Performed at [facility], [place] [address] [place], [place] [address]    Organism ID, Bacteria ESCHERICHIA COLI (*)    All other components within normal limits  POCT URINALYSIS DIP (DEVICE) - Abnormal; Notable for the following components:   Glucose, UA 100 (*)    Ketones, ur TRACE (*)    Hgb urine dipstick MODERATE (*)    Protein, ur 100 (*)    Urobilinogen, UA 2.0 (*)    Nitrite POSITIVE (*)    All other components within normal limits    ASSESSMENT & PLAN:  1. Dysuria   2. Acute cystitis without hematuria     Meds ordered this encounter  Medications  . nitrofurantoin, macrocrystal-monohydrate, (MACROBID) 100 MG capsule    Sig: Take 1 capsule (100 mg total) by mouth 2 (two) times daily.    Dispense:  10 capsule    Refill:  0    Order Specific Question:   Supervising Provider    Answer:   Chase Picket [id number]  . fluconazole (DIFLUCAN) 150 MG tablet    Sig: Take one tablet at the onset of symptoms, if still having symptoms in 3 days, take the second tablet.    Dispense:  2 tablet    Refill:  0    Order Specific Question:   Supervising Provider    Answer:   Chase Picket [id number]    Urine culture sent.   We will  call you with abnormal results that need further treatment.   Push fluids and get plenty of rest.   Take antibiotic as directed and to completion Take pyridium as prescribed and as needed for symptomatic relief Follow up with PCP if symptoms persists Return here or go to ER if you have any new or worsening symptoms such as fever, worsening abdominal pain, nausea/vomiting, flank pain  Outlined signs and symptoms indicating need for more acute intervention. Patient verbalized understanding. After Visit Summary given.      Faustino Congress, NP 11/23/19 1012

## 2019-12-09 ENCOUNTER — Other Ambulatory Visit: Payer: Self-pay

## 2019-12-09 ENCOUNTER — Encounter: Payer: Self-pay | Admitting: Family

## 2019-12-09 ENCOUNTER — Ambulatory Visit (INDEPENDENT_AMBULATORY_CARE_PROVIDER_SITE_OTHER): Payer: 59 | Admitting: Family

## 2019-12-09 ENCOUNTER — Other Ambulatory Visit: Payer: Self-pay | Admitting: Family

## 2019-12-09 VITALS — BP 130/84 | HR 83 | Temp 98.2°F | Wt 164.6 lb

## 2019-12-09 DIAGNOSIS — I10 Essential (primary) hypertension: Secondary | ICD-10-CM

## 2019-12-09 DIAGNOSIS — G8929 Other chronic pain: Secondary | ICD-10-CM

## 2019-12-09 DIAGNOSIS — G43809 Other migraine, not intractable, without status migrainosus: Secondary | ICD-10-CM | POA: Diagnosis not present

## 2019-12-09 DIAGNOSIS — M25561 Pain in right knee: Secondary | ICD-10-CM

## 2019-12-09 MED ORDER — HYDROCODONE-ACETAMINOPHEN 5-325 MG PO TABS: 1.0000 | ORAL_TABLET | Freq: Four times a day (QID) | ORAL | 0 refills | Status: DC | PRN

## 2019-12-09 MED ORDER — TOPIRAMATE 25 MG PO TABS: 25.0000 mg | ORAL_TABLET | Freq: Every day | ORAL | 3 refills | Status: DC

## 2019-12-09 MED ORDER — AMLODIPINE BESYLATE 10 MG PO TABS: 10.0000 mg | ORAL_TABLET | Freq: Every day | ORAL | 3 refills | Status: DC

## 2019-12-09 NOTE — Progress Notes (Signed)
[name] is a 49 y.o. female with the following history as recorded in EpicCare:  Patient Active Problem List   Diagnosis Date Noted  . Cough 09/19/2019  . Patellofemoral arthritis of right knee 06/21/2018  . Cervical radiculopathy 06/21/2018  . Essential hypertension 05/14/2018  . Tobacco abuse 05/14/2018  . Migraine 05/14/2018  . Gout 05/14/2018    Current Outpatient Medications  Medication Sig Dispense Refill  . amLODipine (NORVASC) 10 MG tablet Take 1 tablet (10 mg total) by mouth daily. 90 tablet 3  . aspirin-acetaminophen-caffeine (EXCEDRIN MIGRAINE) 250-250-65 MG tablet Take 2 tablets by mouth every 6 (six) hours as needed for headache.    Marland Kitchen EYSUVIS 0.25 % SUSP Instill 1 drop into both eyes as directed  1 drop 4 times day for 2 weeks then 1 drop 2 times day for 2 weeks    . RESTASIS 0.05 % ophthalmic emulsion 1 drop 2 (two) times daily.    Marland Kitchen topiramate (TOPAMAX) 25 MG tablet Take 1 tablet (25 mg total) by mouth daily. 90 tablet 3  . HYDROcodone-acetaminophen (NORCO) 5-325 MG tablet Take 1 tablet by mouth every 6 (six) hours as needed for moderate pain. 20 tablet 0   No current facility-administered medications for this visit.    Allergies: Xanax [alprazolam]  Past Medical History:  Diagnosis Date  . Gout   . Hypertension   . Migraines     Past Surgical History:  Procedure Laterality Date  . ABDOMINAL HYSTERECTOMY  2008    Family History  Problem Relation Age of Onset  . Hypertension Mother   . Healthy Father     Social History   Tobacco Use  . Smoking status: Current Every Day Smoker    Packs/day: 0.10    Types: Cigarettes  . Smokeless tobacco: Never Used  . Tobacco comment: pack lasts 3 weeks  Substance Use Topics  . Alcohol use: Yes    Comment: occasional    Subjective:  Persisting chronic right knee pain; has had X-ray and MRI with our sports medicine provider; tried injections/ gel injections with no benefit; notes that her new job involves a  lot of standing and her knee is really painful at the end of the day; requesting a note to give to her employer to allow reduced hours;  Also needs updated refills on blood pressure medication and Topamax;     Objective:  Vitals:   12/09/19 1035  BP: 130/84  Pulse: 83  Temp: 98.2 F (36.8 C)  TempSrc: Oral  SpO2: 99%  Weight: 164 lb 9.6 oz (74.7 kg)    General: Well developed, well nourished, in no acute distress  Skin : Warm and dry.  Head: Normocephalic and atraumatic  Lungs: Respirations unlabored; clear to auscultation bilaterally without wheeze, rales, rhonchi  CVS exam: normal rate and regular rhythm.  Musculoskeletal: No deformities; no active joint inflammation  Extremities: No edema, cyanosis, clubbing  Vessels: Symmetric bilaterally  Neurologic: Alert and oriented; speech intact; face symmetrical; moves all extremities well; CNII-XII intact without focal deficit   Assessment:  1. Chronic pain of right knee   2. Essential hypertension   3. Other migraine without status migrainosus, not intractable     Plan:  1. Rx for Norco 5/325 1 tab po q 4-6 hours prn #20- short term prescription until she can see ortho; refer to orthopedist- suspect she will need surgical intervention; 2. Stable; refill updated; 3. Stable; refill updated;   This visit occurred during the SARS-CoV-2 public health  emergency.  Safety protocols were in place, including screening questions prior to the visit, additional usage of staff PPE, and extensive cleaning of exam room while observing appropriate contact time as indicated for disinfecting solutions.      No follow-ups on file.  Orders Placed This Encounter  Procedures  . Ambulatory referral to Orthopedic Surgery    Referral Priority:   Routine    Referral Type:   Surgical    Referral Reason:   Specialty Services Required    Requested Specialty:   Orthopedic Surgery    Number of Visits Requested:   1    Requested Prescriptions   Signed  Prescriptions Disp Refills  . amLODipine (NORVASC) 10 MG tablet 90 tablet 3    Sig: Take 1 tablet (10 mg total) by mouth daily.  Marland Kitchen topiramate (TOPAMAX) 25 MG tablet 90 tablet 3    Sig: Take 1 tablet (25 mg total) by mouth daily.  Marland Kitchen HYDROcodone-acetaminophen (NORCO) 5-325 MG tablet 20 tablet 0    Sig: Take 1 tablet by mouth every 6 (six) hours as needed for moderate pain.

## 2019-12-15 ENCOUNTER — Ambulatory Visit: Payer: Self-pay

## 2019-12-15 ENCOUNTER — Ambulatory Visit (INDEPENDENT_AMBULATORY_CARE_PROVIDER_SITE_OTHER): Payer: 59

## 2019-12-15 ENCOUNTER — Ambulatory Visit (INDEPENDENT_AMBULATORY_CARE_PROVIDER_SITE_OTHER): Payer: 59 | Admitting: Orthopaedic Surgery

## 2019-12-15 ENCOUNTER — Encounter: Payer: Self-pay | Admitting: Physician Assistant

## 2019-12-15 DIAGNOSIS — M25561 Pain in right knee: Secondary | ICD-10-CM

## 2019-12-15 DIAGNOSIS — G8929 Other chronic pain: Secondary | ICD-10-CM

## 2019-12-15 DIAGNOSIS — M25551 Pain in right hip: Secondary | ICD-10-CM

## 2019-12-15 NOTE — Progress Notes (Signed)
Office Visit Note   Patient: [name]           Date of Birth: [birth year]           MRN: [id number] Visit Date: 12/15/2019              Requested by: Olive Bass, FNP [address] [place],  [place] [address] PCP: Olive Bass, FNP   Assessment & Plan: Visit Diagnoses:  1. Chronic pain of right knee     Plan: Impression is right knee pain likely coming from her right hip versus less likely lumbar radiculopathy. The patient has had previous right knee cortisone and viscosupplementation injections without relief even during the anesthetic phase. MRI showed very minimal degenerative changes to the patellofemoral and medial compartments. At this point, we will refer her to Dr. Prince Rome for an ultrasound-guided diagnostic and hopefully therapeutic right hip intra-articular cortisone injection. If she fails to have improvement of symptoms following this injection we will look further into her back.  Follow-Up Instructions: Return if symptoms worsen or fail to improve.   Orders:  Orders Placed This Encounter  Procedures  . XR KNEE 3 VIEW RIGHT  . XR Pelvis 1-2 Views  . XR Lumbar Spine 2-3 Views   No orders of the defined types were placed in this encounter.     Procedures: No procedures performed   Clinical Data: No additional findings.   Subjective: Chief Complaint  Patient presents with  . Neck - Pain  . Right Knee - Pain    HPI patient is a 49 year old female who comes in today for evaluation of her right knee. She has had pain to the right knee for the past 4 years. She has been seeing a sports medicine doctor for this where she has had cortisone and viscosupplementation injections both without relief of symptoms. She had an MRI within the past year which showed mild degenerative changes to the medial and patellofemoral compartments. She comes in today for further evaluation. The pain she is having is to the medial aspect. She does note  when she gets the medial knee pain she has pain to the groin as well. She also notes occasional pain to bilateral lower back. The pain she has are worse when she is standing for too long as well as with rain. She has recently been given Norco by her PCP with moderate relief of symptoms. No previous cortisone injection to the back or hip. No numbness, tingling or burning. No bowel or bladder change and no saddle paresthesias.  Review of Systems as detailed in HPI. All others reviewed and are negative.   Objective: Vital Signs: There were no vitals taken for this visit.  Physical Exam well-developed and well-nourished female no acute distress. Alert and oriented x3.  Ortho Exam examination of her right knee shows no effusion. Range of motion 0 to 110 degrees. No joint line tenderness. Mild patellofemoral crepitus. She does have a moderately positive logroll and FADIR. Positive straight leg raise. Increased pain with lumbar flexion and extension. No focal weakness. She is neurovascular intact distally.  Specialty Comments:  No specialty comments available.  Imaging: XR KNEE 3 VIEW RIGHT  Result Date: 12/15/2019 No acute or structural abnormalities  XR Lumbar Spine 2-3 Views  Result Date: 12/15/2019 Mild degenerative changes L5-S1  XR Pelvis 1-2 Views  Result Date: 12/15/2019 No acute or structural abnormalities    PMFS History: Patient Active Problem List   Diagnosis Date Noted  .  Cough 09/19/2019  . Patellofemoral arthritis of right knee 06/21/2018  . Cervical radiculopathy 06/21/2018  . Essential hypertension 05/14/2018  . Tobacco abuse 05/14/2018  . Migraine 05/14/2018  . Gout 05/14/2018   Past Medical History:  Diagnosis Date  . Gout   . Hypertension   . Migraines     Family History  Problem Relation Age of Onset  . Hypertension Mother   . Healthy Father     Past Surgical History:  Procedure Laterality Date  . ABDOMINAL HYSTERECTOMY  2008   Social History    Occupational History  . Not on file  Tobacco Use  . Smoking status: Current Every Day Smoker    Packs/day: 0.10    Types: Cigarettes  . Smokeless tobacco: Never Used  . Tobacco comment: pack lasts 3 weeks  Vaping Use  . Vaping Use: Never used  Substance and Sexual Activity  . Alcohol use: Yes    Comment: occasional  . Drug use: No  . Sexual activity: Not on file

## 2019-12-15 NOTE — Progress Notes (Signed)
Subjective: Patient is here for ultrasound-guided intra-articular right hip injection.     Objective:  Some pain with passive IR.  Procedure: Ultrasound-guided right hip injection: After sterile prep with Betadine, injected 8 cc 1% lidocaine without epinephrine and 40 mg methylprednisolone using a 22-gauge spinal needle, passing the needle through the iliofemoral ligament into the femoral head/neck junction.  Injectate seen filling joint capsule.  Good immediate relief.

## 2019-12-26 ENCOUNTER — Ambulatory Visit: Payer: 59 | Admitting: Orthopaedic Surgery

## 2019-12-31 ENCOUNTER — Ambulatory Visit (HOSPITAL_COMMUNITY)
Admission: EM | Admit: 2019-12-31 | Discharge: 2019-12-31 | Disposition: A | Discharge status: Home / Self Care | Payer: 59 | Attending: Physician Assistant | Admitting: Physician Assistant

## 2019-12-31 ENCOUNTER — Other Ambulatory Visit: Payer: Self-pay

## 2019-12-31 ENCOUNTER — Encounter (HOSPITAL_COMMUNITY): Payer: Self-pay

## 2019-12-31 DIAGNOSIS — T63441A Toxic effect of venom of bees, accidental (unintentional), initial encounter: Secondary | ICD-10-CM

## 2019-12-31 DIAGNOSIS — R21 Rash and other nonspecific skin eruption: Secondary | ICD-10-CM

## 2019-12-31 MED ORDER — TRIAMCINOLONE ACETONIDE 0.1 % EX CREA
1.0000 | TOPICAL_CREAM | Freq: Two times a day (BID) | CUTANEOUS | 0 refills | Status: DC
Start: 2019-12-31 — End: 2020-07-23

## 2019-12-31 MED ORDER — PREDNISONE 10 MG PO TABS: 40.0000 mg | ORAL_TABLET | Freq: Every day | ORAL | 0 refills | Status: AC

## 2019-12-31 MED ORDER — CETIRIZINE HCL 10 MG PO TABS
10.0000 mg | ORAL_TABLET | Freq: Every day | ORAL | 0 refills | Status: DC
Start: 2019-12-31 — End: 2020-02-20

## 2019-12-31 NOTE — ED Triage Notes (Signed)
Pt is here with a bee sting that happened around 4:30pm on Monday, pt has taken Advil to relieve discomfort.

## 2019-12-31 NOTE — Discharge Instructions (Signed)
Take prednisone daily for 3 days Apply the triamcinolone 2 times a day Apply cool compress Take zyrtec during the day Take benadryl at night only,this will make you sleepy  If acutely worse with pain, fever return or follow up with PCP

## 2019-12-31 NOTE — ED Provider Notes (Signed)
[facility]    CSN: [id number] Arrival date & time: 12/31/19  1609      History   Chief Complaint Chief Complaint  Patient presents with  . Insect Bite    HPI [name] is a 49 y.o. female.   Patient reports for suspected bee sting on her left inner thigh.  This occurred Monday.  She reports she felt the bee sting Monday however woke up Tuesday with some swelling and redness.  She reports lots of itching.  Denies pain.  Has not spread.  No other rash.  No difficulty breathing.     Past Medical History:  Diagnosis Date  . Gout   . Hypertension   . Migraines     Patient Active Problem List   Diagnosis Date Noted  . Cough 09/19/2019  . Patellofemoral arthritis of right knee 06/21/2018  . Cervical radiculopathy 06/21/2018  . Essential hypertension 05/14/2018  . Tobacco abuse 05/14/2018  . Migraine 05/14/2018  . Gout 05/14/2018    Past Surgical History:  Procedure Laterality Date  . ABDOMINAL HYSTERECTOMY  2008    OB History   No obstetric history on file.      Home Medications    Prior to Admission medications   Medication Sig Start Date End Date Taking? Authorizing Provider  amLODipine (NORVASC) 10 MG tablet Take 1 tablet (10 mg total) by mouth daily. 12/09/19   Olive Bass, FNP  aspirin-acetaminophen-caffeine (EXCEDRIN MIGRAINE) [phone] MG tablet Take 2 tablets by mouth every 6 (six) hours as needed for headache.    [provider]  cetirizine (ZYRTEC ALLERGY) 10 MG tablet Take 1 tablet (10 mg total) by mouth daily. 12/31/19   Yukie Bergeron, Veryl Speak, PA-C  EYSUVIS 0.25 % SUSP Instill 1 drop into both eyes as directed  1 drop 4 times day for 2 weeks then 1 drop 2 times day for 2 weeks 11/24/19   [provider]  HYDROcodone-acetaminophen (NORCO) 5-325 MG tablet Take 1 tablet by mouth every 6 (six) hours as needed for moderate pain. 12/09/19   Olive Bass, FNP  predniSONE (DELTASONE) 10 MG tablet Take 4 tablets  (40 mg total) by mouth daily with breakfast for 3 days. 12/31/19 01/03/20  Braelynn Lupton, Veryl Speak, PA-C  RESTASIS 0.05 % ophthalmic emulsion 1 drop 2 (two) times daily. 11/24/19   [provider]  topiramate (TOPAMAX) 25 MG tablet Take 1 tablet (25 mg total) by mouth daily. 12/09/19   Olive Bass, FNP  triamcinolone cream (KENALOG) 0.1 % Apply 1 application topically 2 (two) times daily. 12/31/19   Yovany Clock, Veryl Speak, PA-C    Family History Family History  Problem Relation Age of Onset  . Hypertension Mother   . Healthy Father     Social History Social History   Tobacco Use  . Smoking status: Current Every Day Smoker    Packs/day: 0.10    Types: Cigarettes  . Smokeless tobacco: Never Used  . Tobacco comment: pack lasts 3 weeks  Vaping Use  . Vaping Use: Never used  Substance Use Topics  . Alcohol use: Yes    Comment: occasional  . Drug use: No     Allergies   Xanax [alprazolam]   Review of Systems Review of Systems   Physical Exam Triage Vital Signs ED Triage Vitals [12/31/19 1643]  Enc Vitals Group     BP      Pulse      Resp      Temp  Temp src      SpO2      Weight 164 lb (74.4 kg)     Height      Head Circumference      Peak Flow      Pain Score 0     Pain Loc      Pain Edu?      Excl. in GC?    No data found.  Updated Vital Signs Wt 164 lb (74.4 kg)   BMI 25.69 kg/m   Visual Acuity Right Eye Distance:   Left Eye Distance:   Bilateral Distance:    Right Eye Near:   Left Eye Near:    Bilateral Near:     Physical Exam Vitals and nursing note reviewed.  Constitutional:      General: She is not in acute distress.    Appearance: Normal appearance. She is not ill-appearing.  Cardiovascular:     Rate and Rhythm: Normal rate and regular rhythm.     Heart sounds: No murmur heard.   Pulmonary:     Effort: Pulmonary effort is normal. No respiratory distress.     Breath sounds: Normal breath sounds. No stridor.  Skin:    Comments:  Approximately 10 cm in diameter area of slight swelling with central area of erythema about 4 cm in diameter.  There is a central area of punctation, likely bite location.  Nontender to palpation.  Only mildly warm to touch.  No other rash  Neurological:     Mental Status: She is alert.      UC Treatments / Results  Labs (all labs ordered are listed, but only abnormal results are displayed) Labs Reviewed - No data to display  EKG   Radiology No results found.  Procedures Procedures (including critical care time)  Medications Ordered in UC Medications - No data to display  Initial Impression / Assessment and Plan / UC Course  I have reviewed the triage vital signs and the nursing notes.  Pertinent labs & imaging results that were available during my care of the patient were reviewed by me and considered in my medical decision making (see chart for details).     #Bee sting #Rash Patient is a 49 year old presenting with local allergic reaction to bee sting.  Low suspicion for infection, given duration and exam today.  Will trial short course of prednisone with triamcinolone topically.  Encouraged Benadryl tonight for itch and Zyrtec during the day.  Dust return and follow-up precautions.  Patient verbalized understanding. Final Clinical Impressions(s) / UC Diagnoses   Final diagnoses:  Bee sting, accidental or unintentional, initial encounter  Rash     Discharge Instructions     Take prednisone daily for 3 days Apply the triamcinolone 2 times a day Apply cool compress Take zyrtec during the day Take benadryl at night only,this will make you sleepy  If acutely worse with pain, fever return or follow up with PCP      ED Prescriptions    Medication Sig Dispense Auth. Provider   predniSONE (DELTASONE) 10 MG tablet Take 4 tablets (40 mg total) by mouth daily with breakfast for 3 days. 12 tablet Ulysses Alper, Veryl Speak, PA-C   triamcinolone cream (KENALOG) 0.1 % Apply 1  application topically 2 (two) times daily. 30 g Anthonee Gelin, Veryl Speak, PA-C   cetirizine (ZYRTEC ALLERGY) 10 MG tablet Take 1 tablet (10 mg total) by mouth daily. 14 tablet Nigel Wessman, Veryl Speak, PA-C     PDMP not reviewed this encounter.  Hermelinda Medicus, PA-C 12/31/19 2132

## 2020-01-02 ENCOUNTER — Ambulatory Visit: Payer: 59 | Admitting: Orthopaedic Surgery

## 2020-01-08 ENCOUNTER — Encounter: Payer: Self-pay | Admitting: Family

## 2020-01-14 ENCOUNTER — Telehealth (INDEPENDENT_AMBULATORY_CARE_PROVIDER_SITE_OTHER): Payer: 59 | Admitting: Family

## 2020-01-14 ENCOUNTER — Other Ambulatory Visit: Payer: Self-pay

## 2020-01-14 ENCOUNTER — Ambulatory Visit: Payer: 59 | Admitting: Physician Assistant

## 2020-01-14 ENCOUNTER — Telehealth: Payer: Self-pay

## 2020-01-14 DIAGNOSIS — G43709 Chronic migraine without aura, not intractable, without status migrainosus: Secondary | ICD-10-CM

## 2020-01-14 NOTE — Progress Notes (Signed)
[name] is a 49 y.o. female with the following history as recorded in [employer]:  Patient Active Problem List   Diagnosis Date Noted  . Cough 09/19/2019  . Patellofemoral arthritis of right knee 06/21/2018  . Cervical radiculopathy 06/21/2018  . Essential hypertension 05/14/2018  . Tobacco abuse 05/14/2018  . Migraine 05/14/2018  . Gout 05/14/2018    Current Outpatient Medications  Medication Sig Dispense Refill  . amLODipine (NORVASC) 10 MG tablet Take 1 tablet (10 mg total) by mouth daily. 90 tablet 3  . aspirin-acetaminophen-caffeine (EXCEDRIN MIGRAINE) 250-250-65 MG tablet Take 2 tablets by mouth every 6 (six) hours as needed for headache.    . cetirizine (ZYRTEC ALLERGY) 10 MG tablet Take 1 tablet (10 mg total) by mouth daily. 14 tablet 0  . EYSUVIS 0.25 % SUSP Instill 1 drop into both eyes as directed  1 drop 4 times day for 2 weeks then 1 drop 2 times day for 2 weeks    . HYDROcodone-acetaminophen (NORCO) 5-325 MG tablet Take 1 tablet by mouth every 6 (six) hours as needed for moderate pain. 20 tablet 0  . RESTASIS 0.05 % ophthalmic emulsion 1 drop 2 (two) times daily.    Marland Kitchen topiramate (TOPAMAX) 25 MG tablet Take 1 tablet (25 mg total) by mouth daily. 90 tablet 3  . triamcinolone cream (KENALOG) 0.1 % Apply 1 application topically 2 (two) times daily. 30 g 0   No current facility-administered medications for this visit.    Allergies: Xanax [alprazolam]  Past Medical History:  Diagnosis Date  . Gout   . Hypertension   . Migraines     Past Surgical History:  Procedure Laterality Date  . ABDOMINAL HYSTERECTOMY  2008    Family History  Problem Relation Age of Onset  . Hypertension Mother   . Healthy Father     Social History   Tobacco Use  . Smoking status: Current Every Day Smoker    Packs/day: 0.10    Types: Cigarettes  . Smokeless tobacco: Never Used  . Tobacco comment: pack lasts 3 weeks  Substance Use Topics  . Alcohol use: Yes    Comment:  occasional    Subjective:   I connected with Estefanie Cornforth Loy on 01/14/20 at 10:00 AM EDT by a telephone call and verified that I am speaking with the correct person using two identifiers.   I discussed the limitations of evaluation and management by telemedicine and the availability of in person appointments. The patient expressed understanding and agreed to proceed. Provider in office/ patient is at home; provider and patient are only 2 people on telephone call.   Patient started last week with headache- complaining of dizziness and feels like localized behind one of her eyes; does have history of migraine headaches; has been taking her Topamax everyday; Worried about underlying sinus infection;     Objective:  There were no vitals filed for this visit.  Lungs: Respirations unlabored;  Neurologic: Alert and oriented; speech intact;   Assessment:  1. Chronic migraine without aura without status migrainosus, not intractable     Plan:  Suspect migraine- recommended going to office to get Toradol injection she today; she defers; will treat for possible underlying sinus infection and she will start oral prednisone ( if orthopedist does not give her steroid injection); follow-up in office if symptoms persist;   No follow-ups on file.  No orders of the defined types were placed in this encounter.   Requested Prescriptions    No  prescriptions requested or ordered in this encounter

## 2020-01-14 NOTE — Telephone Encounter (Signed)
New message    Seen today was told by NP Ria Clock a new prescription would be called in. The patient just went to the pharmacy was told they do not have a prescription for her.    [id number] - , [place] - [address]

## 2020-01-16 ENCOUNTER — Encounter: Payer: Self-pay | Admitting: Physician Assistant

## 2020-01-16 ENCOUNTER — Ambulatory Visit (INDEPENDENT_AMBULATORY_CARE_PROVIDER_SITE_OTHER): Payer: 59 | Admitting: Orthopaedic Surgery

## 2020-01-16 ENCOUNTER — Other Ambulatory Visit: Payer: Self-pay | Admitting: Family

## 2020-01-16 ENCOUNTER — Other Ambulatory Visit: Payer: Self-pay

## 2020-01-16 VITALS — Ht 68.0 in | Wt 162.0 lb

## 2020-01-16 DIAGNOSIS — M25552 Pain in left hip: Secondary | ICD-10-CM

## 2020-01-16 MED ORDER — AZITHROMYCIN 250 MG PO TABS: ORAL_TABLET | ORAL | 0 refills | Status: DC

## 2020-01-16 NOTE — Progress Notes (Signed)
Office Visit Note   Patient: [name]           Date of Birth: 02/18/1971           MRN: [id number] Visit Date: 01/16/2020              Requested by: Olive Bass, FNP [address] [place],  [place] [address] PCP: Olive Bass, FNP   Assessment & Plan: Visit Diagnoses:  1. Pain in left hip     Plan: Impression is early arthritis versus labral pathology to the left hip.  We will refer the patient to Dr. Prince Rome for ultrasound-guided cortisone injection to the left hip.  She will schedule this for the near future.  She will follow up with [place] as needed.  Follow-Up Instructions: Return for f/u with Dr. Prince Rome for left hip injection.   Orders:  No orders of the defined types were placed in this encounter.  No orders of the defined types were placed in this encounter.     Procedures: No procedures performed   Clinical Data: No additional findings.   Subjective: Chief Complaint  Patient presents with  . Left side Groin    HPI patient is a pleasant 49 year old female who comes in today with left hip groin pain for the past month.  No known injury or change in activity.  All of her pain is localized to the groin and occasionally the anterior thigh.  Standing and walking seems to aggravate her symptoms.  She denies any numbness, tingling or burning.  She was seen by Dr. Deno Etienne about a month ago with right knee pain which was actually being referred from her right hip.  She had this injected which is significantly helped.  Review of Systems as detailed in HPI.  All others reviewed and are negative.   Objective: Vital Signs: Ht 5\' 8"  (1.727 m)   Wt 162 lb (73.5 kg)   BMI 24.63 kg/m   Physical Exam well-developed well-nourished female no acute distress.  Alert oriented x3.  Ortho Exam examination of her left hip reveals a minimally positive logroll.  Moderately positive FADIR.  Minimally positive straight leg raise.  She is neurovascular intact  distally.  Specialty Comments:  No specialty comments available.  Imaging: X-rays reviewed by me in canopy reveal mild degenerative changes the left hip   PMFS History: Patient Active Problem List   Diagnosis Date Noted  . Cough 09/19/2019  . Patellofemoral arthritis of right knee 06/21/2018  . Cervical radiculopathy 06/21/2018  . Essential hypertension 05/14/2018  . Tobacco abuse 05/14/2018  . Migraine 05/14/2018  . Gout 05/14/2018   Past Medical History:  Diagnosis Date  . Gout   . Hypertension   . Migraines     Family History  Problem Relation Age of Onset  . Hypertension Mother   . Healthy Father     Past Surgical History:  Procedure Laterality Date  . ABDOMINAL HYSTERECTOMY  2008   Social History   Occupational History  . Not on file  Tobacco Use  . Smoking status: Current Every Day Smoker    Packs/day: 0.10    Types: Cigarettes  . Smokeless tobacco: Never Used  . Tobacco comment: pack lasts 3 weeks  Vaping Use  . Vaping Use: Never used  Substance and Sexual Activity  . Alcohol use: Yes    Comment: occasional  . Drug use: No  . Sexual activity: Not Currently    Birth control/protection: Surgical

## 2020-01-22 ENCOUNTER — Ambulatory Visit (INDEPENDENT_AMBULATORY_CARE_PROVIDER_SITE_OTHER): Payer: 59 | Admitting: Family Medicine

## 2020-01-22 ENCOUNTER — Ambulatory Visit: Payer: Self-pay

## 2020-01-22 ENCOUNTER — Encounter: Payer: Self-pay | Admitting: Family Medicine

## 2020-01-22 ENCOUNTER — Other Ambulatory Visit: Payer: Self-pay

## 2020-01-22 DIAGNOSIS — M25552 Pain in left hip: Secondary | ICD-10-CM | POA: Diagnosis not present

## 2020-01-22 NOTE — Progress Notes (Signed)
Subjective: Patient is here for ultrasound-guided intra-articular left hip injection.   She is been having similar pain on the right, we injected it last month and she got temporary relief but it is already hurting again as before.  She struggles with back pain related to scoliosis as well.  Objective: Good range of motion but pain with passive hip flexion and internal rotation.  Procedure: Ultrasound-guided left hip injection: After sterile prep with Betadine, injected 8 cc 1% lidocaine without epinephrine and 40 mg methylprednisolone using a 22-gauge spinal needle, passing the needle through the iliofemoral ligament into the femoral head/neck junction.  Injectate was seen filling the joint capsule.  She had good relief during the anesthetic phase.    If this is only short-lived, consider further imaging with MRI scan and possibly labs to look for rheumatologic disease.

## 2020-01-28 ENCOUNTER — Telehealth: Payer: Self-pay | Admitting: Family

## 2020-01-28 NOTE — Telephone Encounter (Signed)
New Message:   Pt is calling and states she needs the frequency of this medication  Changed and sent to the pharmacy so she can get a refill. She states it currently says for her to take 1 but the last time it was discussed with [place] Scrape she told her to start taking 2. The medication is topiramate (TOPAMAX) 25 MG tablet. Please advise.    [id number] - [place], [place] - [address]

## 2020-01-29 ENCOUNTER — Other Ambulatory Visit: Payer: Self-pay | Admitting: Family

## 2020-01-29 MED ORDER — TOPIRAMATE 25 MG PO TABS: 25.0000 mg | ORAL_TABLET | Freq: Two times a day (BID) | ORAL | 3 refills | Status: DC

## 2020-02-19 ENCOUNTER — Encounter: Payer: Self-pay | Admitting: Family Medicine

## 2020-02-19 ENCOUNTER — Telehealth (INDEPENDENT_AMBULATORY_CARE_PROVIDER_SITE_OTHER): Payer: 59 | Admitting: Family Medicine

## 2020-02-19 VITALS — Temp 98.7°F

## 2020-02-19 DIAGNOSIS — R0981 Nasal congestion: Secondary | ICD-10-CM

## 2020-02-19 NOTE — Progress Notes (Signed)
Virtual Visit via Video Note  I connected with [name]  on 02/19/20 at  3:40 PM EDT by a video enabled telemedicine application and verified that I am speaking with the correct person using two identifiers.  Location patient: home Location provider:work or home office Persons participating in the virtual visit: patient, provider  I discussed the limitations of evaluation and management by telemedicine and the availability of in person appointments. The patient expressed understanding and agreed to proceed.   HPI:  Acute telemedicine visit for Sinus Congestion: -Onset: 2 days  -Symptoms include: nasal congestion, PND -Denies: cough, sore throat, CP, SOB, NVD, bodyaches, fevers, NVD -Has tried: vicks, zyrtec - has helped -Pertinent past medical history:  -Pertinent medication allergies: xanax -COVID-19 vaccine status: J and J vaccine  ROS: See pertinent positives and negatives per HPI.  Past Medical History:  Diagnosis Date  . Gout   . Hypertension   . Migraines     Past Surgical History:  Procedure Laterality Date  . ABDOMINAL HYSTERECTOMY  2008     Current Outpatient Medications:  .  amLODipine (NORVASC) 10 MG tablet, Take 1 tablet (10 mg total) by mouth daily., Disp: 90 tablet, Rfl: 3 .  aspirin-acetaminophen-caffeine (EXCEDRIN MIGRAINE) 250-250-65 MG tablet, Take 2 tablets by mouth every 6 (six) hours as needed for headache., Disp: , Rfl:  .  cetirizine (ZYRTEC ALLERGY) 10 MG tablet, Take 1 tablet (10 mg total) by mouth daily., Disp: 14 tablet, Rfl: 0 .  EYSUVIS 0.25 % SUSP, Instill 1 drop into both eyes as directed  1 drop 4 times day for 2 weeks then 1 drop 2 times day for 2 weeks, Disp: , Rfl:  .  HYDROcodone-acetaminophen (NORCO) 5-325 MG tablet, Take 1 tablet by mouth every 6 (six) hours as needed for moderate pain., Disp: 20 tablet, Rfl: 0 .  RESTASIS 0.05 % ophthalmic emulsion, 1 drop 2 (two) times daily., Disp: , Rfl:  .  topiramate (TOPAMAX) 25 MG tablet, Take  1 tablet (25 mg total) by mouth 2 (two) times daily., Disp: 180 tablet, Rfl: 3 .  triamcinolone cream (KENALOG) 0.1 %, Apply 1 application topically 2 (two) times daily., Disp: 30 g, Rfl: 0  EXAM:  VITALS per patient if applicable:  GENERAL: alert, oriented, appears well and in no acute distress  HEENT: atraumatic, conjunttiva clear, no obvious abnormalities on inspection of external nose and ears  NECK: normal movements of the head and neck  LUNGS: on inspection no signs of respiratory distress, breathing rate appears normal, no obvious gross SOB, gasping or wheezing  CV: no obvious cyanosis  MS: moves all visible extremities without noticeable abnormality  PSYCH/NEURO: pleasant and cooperative, no obvious depression or anxiety, speech and thought processing grossly intact  ASSESSMENT AND PLAN:  Discussed the following assessment and plan:  Nasal congestion  -we discussed possible serious and likely etiologies, options for evaluation and workup, limitations of telemedicine visit vs in person visit, treatment, treatment risks and precautions. Pt prefers to treat via telemedicine empirically rather than in person at this moment.  Query allergic rhinitis versus viral upper respiratory illness.  Also discussed possibility of breakthrough COVID-19 case.  Opted for treatment with nasal saline, a short 3-day course of nasal decongestant, continuation of the Zyrtec and staying hydrated.  Provided additional home care tips in her patient instructions.  Discussed options for COVID-19 testing.  Advised staying home while sick and per guidelines if Covid test is positive.  Advised that she follow-up with her primary care  doctor if she has a positive Covid test. Work/School slipped offered: provided in patient instructions    Advised to seek prompt follow up telemedicine visit or in person care if worsening, new symptoms arise, or if is not improving with treatment. Did let this patient know that I  only do telemedicine on Tuesdays and Thursdays for [place]. Advised to schedule follow up visit with PCP or [facility] if any further questions or concerns to avoid delays in care.   I discussed the assessment and treatment plan with the patient. The patient was provided an opportunity to ask questions and all were answered. The patient agreed with the plan and demonstrated an understanding of the instructions.     Terressa Koyanagi, DO

## 2020-02-19 NOTE — Patient Instructions (Addendum)
     ---------------------------------------------------------------------------------------------------------------------------      WORK SLIP:  Patient [name],  26-Mar-1971, was seen for a medical visit today, 02/19/20 . Please excuse from work according to the [facility] guidelines for a COVID like illness. We advise 10 days minimum from the onset of symptoms (02/17/20)) PLUS 1 day of no fever and improved symptoms. Will defer to employer for a sooner return to work if COVID19 testing is negative and the symptoms have resolved. Advise following CDC guidelines.    Sincerely: E-signature: Dr. Kriste Basque, DO [facility] Ph: [phone]   ------------------------------------------------------------------------------------------------------------------------------    -stay home while sick, and if you have COVID19 please stay home for a full 10 days since the onset of symptoms PLUS one day of no fever and feeling better  - COVID19 testing information: [email or web address] OR [phone]  -can use tylenol or aleve if needed for fevers, aches and pains per instructions  -can use nasal saline a few times per day if nasal congestion  -a short course of Afrin nasal spray for 3 days can help with nasal congestion as well  -stay hydrated, drink plenty of fluids and eat small healthy meals - avoid dairy  -can take 1000 IU Vit D3 and Vit C lozenges per instructions  -follow up with your doctor in 2-3 days unless improving and feeling better  I hope you are feeling better soon! Seek in-person care or a follow up telemedicine visit promptly if your symptoms worsen, new concerns arise or you are not improving as expected. Call 911 if severe symptoms.

## 2020-02-20 ENCOUNTER — Telehealth: Payer: Self-pay | Admitting: Family

## 2020-02-20 MED ORDER — CETIRIZINE HCL 10 MG PO TABS: 10.0000 mg | ORAL_TABLET | Freq: Every day | ORAL | 0 refills | Status: DC

## 2020-02-20 MED ORDER — IPRATROPIUM BROMIDE 0.03 % NA SOLN: 2.0000 | Freq: Two times a day (BID) | NASAL | 12 refills | Status: DC

## 2020-02-20 NOTE — Telephone Encounter (Signed)
Contacted patient with notes, she stated she feels fine minus the stuffy nose. Declined meds and infusion.

## 2020-02-20 NOTE — Telephone Encounter (Signed)
I can call in [facility] for her; we can refer her for the antibody infusion to help limit symptoms.  Please make sure she is aware that she needs to quarantine for 10 days from date of positive test;  Please make sure she is not having chest pain or shortness of breath.

## 2020-02-20 NOTE — Telephone Encounter (Signed)
    Patient requesting medication for "stuffy nose".  Declined [facility]

## 2020-02-20 NOTE — Addendum Note (Signed)
Addended by: Eustace Moore on: 02/20/2020 04:42 PM   Modules accepted: Orders

## 2020-02-20 NOTE — Telephone Encounter (Signed)
Patient saw Dr. Selena Batten yesterday for a virtual appointment and she told the patient to go get Covid tested. The patient purchased an at home Covid test and it came back positive 02/19/2020. She was wondering if there was anything that could be called in for her.

## 2020-03-07 ENCOUNTER — Encounter: Payer: Self-pay | Admitting: Family

## 2020-03-12 ENCOUNTER — Other Ambulatory Visit: Payer: Self-pay | Admitting: Orthopaedic Surgery

## 2020-03-12 ENCOUNTER — Ambulatory Visit (INDEPENDENT_AMBULATORY_CARE_PROVIDER_SITE_OTHER): Payer: 59 | Admitting: Orthopaedic Surgery

## 2020-03-12 ENCOUNTER — Telehealth: Payer: Self-pay | Admitting: Radiology

## 2020-03-12 ENCOUNTER — Other Ambulatory Visit: Payer: Self-pay | Admitting: Radiology

## 2020-03-12 ENCOUNTER — Encounter: Payer: Self-pay | Admitting: Orthopaedic Surgery

## 2020-03-12 DIAGNOSIS — M1711 Unilateral primary osteoarthritis, right knee: Secondary | ICD-10-CM

## 2020-03-12 MED ORDER — HYDROCODONE-ACETAMINOPHEN 5-325 MG PO TABS: 1.0000 | ORAL_TABLET | Freq: Every evening | ORAL | 0 refills | Status: DC | PRN

## 2020-03-12 NOTE — Telephone Encounter (Signed)
E-prescribe down. Please print and sign rx that was prescribed by Dr. Warren Danes team today. Thanks.

## 2020-03-12 NOTE — Progress Notes (Signed)
Office Visit Note   Patient: [name]           Date of Birth: [birth year]           MRN: [id number] Visit Date: 03/12/2020              Requested by: [name], [name],  [name] [id number] PCP: Katherine Salvage, FNP   Assessment & Plan: Visit Diagnoses:  1. Primary osteoarthritis of right knee     Plan: Impression is right knee mild arthritis.  She had a MRI last year which showed mild OA without any other abnormalities.  We will obtain arthritis panel to rule out inflammatory arthritis.  I gave her a small supply of hydrocodone to take at nighttime.  Referral to pain clinic was made today.  I do not recommend a knee replacement at this time given the lack of significant degenerative changes and the likelihood that the surgery would not be of much benefit.  Follow-Up Instructions: Return if symptoms worsen or fail to improve.   Orders:  Orders Placed This Encounter  Procedures   Uric acid   Rheumatoid Factor   Sed Rate (ESR)   Antinuclear Antib (ANA)   Ambulatory referral to Pain Clinic   Meds ordered this encounter  Medications   HYDROcodone-acetaminophen (NORCO) 5-325 MG tablet    Sig: Take 1 tablet by mouth at bedtime as needed for moderate pain.    Dispense:  20 tablet    Refill:  0      Procedures: No procedures performed   Clinical Data: No additional findings.   Subjective: Chief Complaint  Patient presents with   Right Knee - Pain    [name] returns today for follow-up of continued right knee pain for the last 3 to 4 years.  She has tried essentially every medical and nonsurgical treatments without any relief.  She has pain at all times.  She works at [employer].  She is unable to wear heels.  She currently wears a compression knee brace.   Review of Systems  Constitutional: Negative.   HENT: Negative.   Eyes: Negative.   Respiratory: Negative.   Cardiovascular: Negative.   Endocrine:  Negative.   Musculoskeletal: Negative.   Neurological: Negative.   Hematological: Negative.   Psychiatric/Behavioral: Negative.   All other systems reviewed and are negative.    Objective: Vital Signs: There were no vitals taken for this visit.  Physical Exam Vitals and nursing note reviewed.  Constitutional:      Appearance: She is well-developed.  Pulmonary:     Effort: Pulmonary effort is normal.  Skin:    General: Skin is warm.     Capillary Refill: Capillary refill takes less than 2 seconds.  Neurological:     Mental Status: She is alert and oriented to person, place, and time.  Psychiatric:        Behavior: Behavior normal.        Thought Content: Thought content normal.        Judgment: Judgment normal.     Ortho Exam Right knee shows no joint effusion.  Normal patella tracking.  No patellofemoral crepitus.  Collaterals and cruciates are stable.  No joint line tenderness. Specialty Comments:  No specialty comments available.  Imaging: No results found.   PMFS History: Patient Active Problem List   Diagnosis Date Noted   Cough 09/19/2019   Patellofemoral arthritis of right knee 06/21/2018   Cervical radiculopathy 06/21/2018  Essential hypertension 05/14/2018   Tobacco abuse 05/14/2018   Migraine 05/14/2018   Gout 05/14/2018   Past Medical History:  Diagnosis Date   Gout    Hypertension    Migraines     Family History  Problem Relation Age of Onset   Hypertension Mother    Healthy Father     Past Surgical History:  Procedure Laterality Date   ABDOMINAL HYSTERECTOMY  2008   Social History   Occupational History   Not on file  Tobacco Use   Smoking status: Current Every Day Smoker    Packs/day: 0.10    Types: Cigarettes   Smokeless tobacco: Never Used   Tobacco comment: pack lasts 3 weeks  Vaping Use   Vaping Use: Never used  Substance and Sexual Activity   Alcohol use: Yes    Comment: occasional   Drug use: No     Sexual activity: Not Currently    Birth control/protection: Surgical

## 2020-03-12 NOTE — Telephone Encounter (Signed)
FYI  Patient came back to office to pick up rx for Hydrocodone. Dr. Ophelia Charter was unable to print rx but system had resolved issues and he sent in electronically. Patient left to go to pharmacy while I called to be sure they had it. When I reached pharmacist, he states that prior [name] is needed unless he only fills 7 day supply.  I gave ok for #7 only so that patient could get her medications today as it is 5:20p and we may not get authorization for #20.

## 2020-03-13 NOTE — Telephone Encounter (Signed)
Ok thanks 

## 2020-03-15 LAB — URIC ACID: Uric Acid, Serum: 3.9 mg/dL (ref 2.5–7.0)

## 2020-03-15 LAB — RHEUMATOID FACTOR: Rheumatoid fact SerPl-aCnc: <14 IU/mL (ref <14)

## 2020-03-15 LAB — ANA: Anti Nuclear Antibody (ANA): NEGATIVE

## 2020-03-15 LAB — SEDIMENTATION RATE: Sed Rate: 17 mm/h (ref 0–20)

## 2020-03-19 ENCOUNTER — Encounter: Payer: Self-pay | Admitting: Physical Medicine & Rehabilitation

## 2020-03-19 ENCOUNTER — Other Ambulatory Visit: Payer: Self-pay

## 2020-03-19 ENCOUNTER — Encounter: Payer: 59 | Attending: Physical Medicine & Rehabilitation | Admitting: Physical Medicine & Rehabilitation

## 2020-03-19 VITALS — BP 119/79 | HR 77 | Temp 98.4°F | Ht 68.0 in | Wt 159.6 lb

## 2020-03-19 DIAGNOSIS — Z79891 Long term (current) use of opiate analgesic: Secondary | ICD-10-CM | POA: Diagnosis present

## 2020-03-19 DIAGNOSIS — Z5181 Encounter for therapeutic drug level monitoring: Secondary | ICD-10-CM | POA: Diagnosis not present

## 2020-03-19 DIAGNOSIS — M1711 Unilateral primary osteoarthritis, right knee: Secondary | ICD-10-CM | POA: Diagnosis not present

## 2020-03-19 DIAGNOSIS — G894 Chronic pain syndrome: Secondary | ICD-10-CM | POA: Diagnosis not present

## 2020-03-19 NOTE — Patient Instructions (Signed)
Triamcinolone extended-release injection What is this medicine? TRIAMCINOLONE (trye am SIN oh lone) is a corticosteroid. It helps to reduce swelling and treat pain. This medicine is used to treat arthritis in the knee. This medicine may be used for other purposes; ask your health care provider or pharmacist if you have questions. COMMON BRAND NAME(S): Zilretta What should I tell my health care provider before I take this medicine? They need to know if you have any of these conditions:  Cushing's syndrome  diabetes  glaucoma  heart disease  high blood pressure  infection, like tuberculosis, herpes, measles, chickenpox, or fungal infection  liver disease  low levels of potassium in the blood  mental illness  myasthenia gravis  recent heart attack  seizures  stomach or intestine disease  thyroid disease  an unusual or allergic reaction to triamcinolone, corticosteroids, benzyl alcohol, other medicines, foods, dyes, or preservatives  pregnant or trying to get pregnant  breast-feeding How should I use this medicine? This medicine is injected into the joint by a health care professional. After your dose follow your doctor's instructions for your care. Contact your pediatrician regarding the use of this medicine in children. Special care may be needed. Overdosage: If you think you have taken too much of this medicine contact a poison control center or emergency room at once. NOTE: This medicine is only for you. Do not share this medicine with others. What if I miss a dose? This does not apply. What may interact with this medicine?  antiviral medicines for HIV or AIDS  aspirin  certain medicines for fungal infections like ketoconazole and itraconazole  clarithromycin  mifepristone  nefazodone  other steroid medicines  vaccines and other immunization products This list may not describe all possible interactions. Give your health care provider a list of all the  medicines, herbs, non-prescription drugs, or dietary supplements you use. Also tell them if you smoke, drink alcohol, or use illegal drugs. Some items may interact with your medicine. What should I watch for while using this medicine? Visit your doctor or health care professional for regular checks on your progress. If you are taking this medicine for a long time, carry an identification card with your name, the type and dose of medicine, and your doctor's name and address. Do not come in contact with people who have chickenpox or the measles while you are taking this medicine. If you do, call your doctor right away. This medicine may increase blood sugar. Ask your healthcare provider if changes in diet or medicines are needed if you have diabetes. What side effects may I notice from receiving this medicine? Side effects that you should report to your doctor or health care professional as soon as possible:  allergic reactions like skin rash, itching or hives, swelling of the face, lips, or tongue  bloody or black, tarry stools  changes in emotions or moods  excessive hair growth on the face or body  eye pain  increased blood pressure  increased joint pain and swelling at site where injected  lumpy, thin skin at site where injected  rounding of face  seizures  signs and symptoms of high blood sugar such as being more thirsty or hungry or having to urinate more than normal. You may also feel very tired or have blurry vision.  signs and symptoms of infection like fever or chills; cough; sore throat; pain or trouble passing urine  sores that do not heal  stomach pain  swelling of ankles, feet, hands  trouble sleeping  unusual bruising or bleeding Side effects that usually do not require medical attention (report these to your doctor or health care professional if they continue or are bothersome):  headache  nausea  pain, redness, or irritation at site where  injected  upset stomach  weight gain This list may not describe all possible side effects. Call your doctor for medical advice about side effects. You may report side effects to FDA at 1-800-FDA-1088. Where should I keep my medicine? This drug is given in a hospital or clinic and will not be stored at home. NOTE: This sheet is a summary. It may not cover all possible information. If you have questions about this medicine, talk to your doctor, pharmacist, or health care provider.  2020 Elsevier/Gold Standard (2018-02-14 11:00:49)

## 2020-03-19 NOTE — Addendum Note (Signed)
Addended by: Silas Sacramento T on: 03/19/2020 03:18 PM   Modules accepted: Orders

## 2020-03-19 NOTE — Progress Notes (Addendum)
Subjective:    Patient ID: [name], female    DOB: 1970/09/17, 49 y.o.   MRN: [id number]  HPI CC:  RIght knee pain  49 yo female with several year hx of RIght knee pain , no injury.  Pain increases with weight bearing.  Relieved by laying down.  Tried creams such as Voltaren gel and Aspercreme, no relief with injections (short acting cortico steroid as well as gel injections)  , no relief with IA hip injections.  She has received hydrocodone prescriptions on several occasions from orthopedic surgery.  The patient has been referred to this office to explore further treatment options. The patient is able to form all her self-care and mobility independently, she is able to work full-time at a [employer].  She is on her feet with that job. Pain Inventory Average Pain 10 Pain Right Now 10 My pain is sharp, tingling and aching  In the last 24 hours, has pain interfered with the following? General activity 10 Relation with others 10 Enjoyment of life 10 What TIME of day is your pain at its worst? morning , daytime, evening and night Sleep (in general) Poor  Pain is worse with: walking, bending, sitting and standing Pain improves with: rest, heat/ice and medication Relief from Meds: 2  walk without assistance do you drive?  yes  Do you have any goals in this area?  no  trouble walking  Any changes since last visit?  no  Has had steroid injections and synvisc equivalent injected without relief   Orthopedist Dr Roda Shutters    Family History  Problem Relation Age of Onset  . Hypertension Mother   . Healthy Father    Social History   Socioeconomic History  . Marital status: Single    Spouse name: Not on file  . Number of children: Not on file  . Years of education: Not on file  . Highest education level: Not on file  Occupational History  . Not on file  Tobacco Use  . Smoking status: Current Every Day Smoker    Packs/day: 0.10    Types: Cigarettes  .  Smokeless tobacco: Never Used  . Tobacco comment: pack lasts 3 weeks  Vaping Use  . Vaping Use: Never used  Substance and Sexual Activity  . Alcohol use: Yes    Comment: occasional  . Drug use: No  . Sexual activity: Not Currently    Birth control/protection: Surgical  Other Topics Concern  . Not on file  Social History Narrative  . Not on file   Social Determinants of Health   Financial Resource Strain:   . Difficulty of Paying Living Expenses: Not on file  Food Insecurity:   . Worried About Programme researcher, broadcasting/film/video in the Last Year: Not on file  . Ran Out of Food in the Last Year: Not on file  Transportation Needs:   . Lack of Transportation (Medical): Not on file  . Lack of Transportation (Non-Medical): Not on file  Physical Activity:   . Days of Exercise per Week: Not on file  . Minutes of Exercise per Session: Not on file  Stress:   . Feeling of Stress : Not on file  Social Connections:   . Frequency of Communication with Friends and Family: Not on file  . Frequency of Social Gatherings with Friends and Family: Not on file  . Attends Religious Services: Not on file  . Active Member of Clubs or Organizations: Not on file  .  Attends Banker Meetings: Not on file  . Marital Status: Not on file   Past Surgical History:  Procedure Laterality Date  . ABDOMINAL HYSTERECTOMY  2008   Past Medical History:  Diagnosis Date  . Gout   . Hypertension   . Migraines    BP 119/79   Pulse 77   Temp 98.4 F (36.9 C)   Ht 5\' 8"  (1.727 m)   Wt 159 lb 9.6 oz (72.4 kg)   SpO2 99%   BMI 24.27 kg/m   Opioid Risk Score:   Fall Risk Score:  `1  Depression screen PHQ 2/9  Depression screen [facility] 2/9 03/19/2020 05/14/2018  Decreased Interest 0 0  Down, Depressed, Hopeless 0 1  PHQ - 2 Score 0 1  Altered sleeping 0 -  Tired, decreased energy 0 -  Change in appetite 0 -  Feeling bad or failure about yourself  0 -  Trouble concentrating 0 -  Moving slowly or  fidgety/restless 0 -  Suicidal thoughts 0 -  PHQ-9 Score 0 -    Review of Systems  Constitutional: Negative.   HENT: Negative.   Eyes: Negative.   Respiratory: Negative.   Cardiovascular: Negative.   Gastrointestinal: Negative.   Endocrine: Negative.   Genitourinary: Negative.   Musculoskeletal: Positive for arthralgias and gait problem.       Right knee pain  Skin: Negative.   Allergic/Immunologic: Negative.   Neurological:       Tingling  Hematological: Negative.   Psychiatric/Behavioral: Negative.   All other systems reviewed and are negative.      Objective:   Physical Exam Vitals and nursing note reviewed.  Constitutional:      Appearance: She is normal weight.  HENT:     Head: Normocephalic and atraumatic.  Eyes:     Extraocular Movements: Extraocular movements intact.     Conjunctiva/sclera: Conjunctivae normal.     Pupils: Pupils are equal, round, and reactive to light.  Cardiovascular:     Rate and Rhythm: Normal rate and regular rhythm.     Pulses: Normal pulses.     Heart sounds: Normal heart sounds. No murmur heard.   Pulmonary:     Effort: Pulmonary effort is normal. No respiratory distress.     Breath sounds: Normal breath sounds. No wheezing.  Abdominal:     General: Abdomen is flat. Bowel sounds are normal. There is no distension.     Palpations: Abdomen is soft.  Musculoskeletal:     Cervical back: Normal range of motion. No tenderness.     Comments: Patient has normal spine range of motion with flexion extension lateral bending and rotation Upper extremity range of motion is normal Extremity range of motion normal however has pain with knee flexion. There is crepitus at the right knee which is mild to moderate No pain along the joint line there is tenderness around the circumference of the right patella but not on the left side no evidence of effusion no evidence of knee deformity  Skin:    General: Skin is warm and dry.  Neurological:      General: No focal deficit present.     Mental Status: She is alert and oriented to person, place, and time.  Psychiatric:        Mood and Affect: Mood normal.        Behavior: Behavior normal.           Assessment & Plan:  #1.  Chronic knee pain has  patellofemoral femoral osteoarthritis and likely chondromalacia.  X-rays do not show any significant osteoarthritis of the tibiofemoral joint.  The patient is independent with all her self-care and mobility.  We discussed treatment options including long-acting steroid injection, she elected to proceed.  We also discussed genicular nerve blocks which she would like to think about at this point.  We discussed the role of pain medications.  Given the good functional status as well as mild radiographic findings and benign examination, would not recommend higher strength narcotic analgesics (schedule 2).  May trial schedule 3 or 4 at low dose.  As discussed with patient will need to check urine screen.  UDS demonstrated hydrocodone as prescribed.  Would not prescribe narcotic analgesics given lack of random urine testing.  May do Zilretta injection

## 2020-03-25 LAB — TOXASSURE SELECT,+ANTIDEPR,UR

## 2020-03-29 ENCOUNTER — Telehealth: Payer: Self-pay | Admitting: *Deleted

## 2020-03-29 NOTE — Telephone Encounter (Signed)
Urine drug screen for this encounter is consistent for prescribed medication 

## 2020-04-06 ENCOUNTER — Encounter: Payer: Self-pay | Admitting: Physical Medicine & Rehabilitation

## 2020-04-06 ENCOUNTER — Other Ambulatory Visit: Payer: Self-pay

## 2020-04-06 ENCOUNTER — Encounter: Payer: 59 | Attending: Physical Medicine & Rehabilitation | Admitting: Physical Medicine & Rehabilitation

## 2020-04-06 VITALS — BP 134/87 | HR 93 | Temp 98.6°F | Ht 67.0 in | Wt 163.0 lb

## 2020-04-06 DIAGNOSIS — M1711 Unilateral primary osteoarthritis, right knee: Secondary | ICD-10-CM | POA: Diagnosis not present

## 2020-04-06 NOTE — Patient Instructions (Signed)
Knee Injection A knee injection is a procedure to get medicine into your knee joint to relieve the pain, swelling, and stiffness of arthritis. Your health care provider uses a needle to inject medicine, which may also help to lubricate and cushion your knee joint. You may need more than one injection. Tell a health care provider about:  Any allergies you have.  All medicines you are taking, including vitamins, herbs, eye drops, creams, and over-the-counter medicines.  Any problems you or family members have had with anesthetic medicines.  Any blood disorders you have.  Any surgeries you have had.  Any medical conditions you have.  Whether you are pregnant or may be pregnant. What are the risks? Generally, this is a safe procedure. However, problems may occur, including:  Infection.  Bleeding.  Symptoms that get worse.  Damage to the area around your knee.  Allergic reaction to any of the medicines.  Skin reactions from repeated injections. What happens before the procedure?  Ask your health care provider about changing or stopping your regular medicines. This is especially important if you are taking diabetes medicines or blood thinners.  Plan to have someone take you home from the hospital or clinic. What happens during the procedure?   You will sit or lie down in a position for your knee to be treated.  The skin over your kneecap will be cleaned with a germ-killing soap.  You will be given a medicine that numbs the area (local anesthetic). You may feel some stinging.  The medicine will be injected into your knee. The needle is carefully placed between your kneecap and your knee. The medicine is injected into the joint space.  The needle will be removed at the end of the procedure.  A bandage (dressing) may be placed over the injection site. The procedure may vary among health care providers and hospitals. What can I expect after the procedure?  Your blood  pressure, heart rate, breathing rate, and blood oxygen level will be monitored until you leave the hospital or clinic.  You may have to move your knee through its full range of motion. This helps to get all the medicine into your joint space.  You will be watched to make sure that you do not have a reaction to the injected medicine.  You may feel more pain, swelling, and warmth than you did before the injection. This reaction may last about 1-2 days. Follow these instructions at home: Medicines  Take over-the-counter and prescription medicines only as told by your doctor.  Do not drive or use heavy machinery while taking prescription pain medicine.  Do not take medicines such as aspirin and ibuprofen unless your health care provider tells you to take them. Injection site care  Follow instructions from your health care provider about: ? How to take care of your puncture site. ? When and how you should change your dressing. ? When you should remove your dressing.  Check your injection area every day for signs of infection. Check for: ? More redness, swelling, or pain after 2 days. ? Fluid or blood. ? Pus or a bad smell. ? Warmth. Managing pain, stiffness, and swelling   If directed, put ice on the injection area: ? Put ice in a plastic bag. ? Place a towel between your skin and the bag. ? Leave the ice on for 20 minutes, 2-3 times per day.  Do not apply heat to your knee.  Raise (elevate) the injection area above the level   of your heart while you are sitting or lying down. General instructions  If you were given a dressing, keep it dry until your health care provider says it can be removed. Ask your health care provider when you can start showering or taking a bath.  Avoid strenuous activities for as long as directed by your health care provider. Ask your health care provider when you can return to your normal activities.  Keep all follow-up visits as told by your health  care provider. This is important. You may need more injections. Contact a health care provider if you have:  A fever.  Warmth in your injection area.  Fluid, blood, or pus coming from your injection site.  Symptoms at your injection site that last longer than 2 days after your procedure. Get help right away if:  Your knee: ? Turns very red. ? Becomes very swollen. ? Is in severe pain. Summary  A knee injection is a procedure to get medicine into your knee joint to relieve the pain, swelling, and stiffness of arthritis.  A needle is carefully placed between your kneecap and your knee to inject medicine into the joint space.  Before the procedure, ask your health care provider about changing or stopping your regular medicines, especially if you are taking diabetes medicines or blood thinners.  Contact your health care provider if you have any problems or questions after your procedure. This information is not intended to replace advice given to you by your health care provider. Make sure you discuss any questions you have with your health care provider. Document Revised: 06/04/2017 Document Reviewed: 06/04/2017 Elsevier Patient Education  2020 Elsevier Inc.  

## 2020-04-06 NOTE — Progress Notes (Signed)
RIGHT Knee injection  ultrasound guidance)  Indication:RIGHT Knee pain not relieved by medication management and other conservative care.  Informed consent was obtained after describing risks and benefits of the procedure with the patient, this includes bleeding, bruising, infection and medication side effects. The patient wishes to proceed and has given written consent. The patient was placed in a recumbent position. The medial aspect of the knee was marked and prepped with Betadine and alcohol. It was then entered with a 25-gauge 1-1/2 inch needle and 1 mL of 1% lidocaine was injected into the skin and subcutaneous tissue. Then another 21g 2"needle was inserted into the knee joint. After negative draw back for blood, a solution containing ofZilretta (32mg  Triamcinolone) injected. The patient tolerated the procedure well. Post procedure instructions were given.  Pt indicates no pain meds are needed , pt to call , consider tramadol as first line

## 2020-04-15 ENCOUNTER — Telehealth: Payer: Self-pay | Admitting: *Deleted

## 2020-04-15 NOTE — Telephone Encounter (Signed)
[name] called and says that she is having cramps in the backs of her calves at night when trying to go to sleep.  Please advise.

## 2020-04-16 NOTE — Telephone Encounter (Signed)
May try calf stretching followed by application of Salon Pas or Icy Hot patch to back of knee

## 2020-04-16 NOTE — Telephone Encounter (Signed)
I notified Mrs Grimsley.

## 2020-07-01 ENCOUNTER — Encounter: Payer: 59 | Admitting: Physical Medicine & Rehabilitation

## 2020-07-01 ENCOUNTER — Telehealth: Payer: Self-pay | Admitting: Physical Medicine & Rehabilitation

## 2020-07-01 ENCOUNTER — Other Ambulatory Visit: Payer: Self-pay | Admitting: Physical Medicine & Rehabilitation

## 2020-07-01 MED ORDER — TRAMADOL HCL 50 MG PO TABS: 50.0000 mg | ORAL_TABLET | Freq: Two times a day (BID) | ORAL | 0 refills | Status: AC

## 2020-07-01 NOTE — Telephone Encounter (Signed)
Patient's appointment was pushed back to 2/25 at 9:45 and she is requesting pain medication until she can get her injection.  Her appointment was originally today an have to move due to staffing.  Please call patient.

## 2020-07-01 NOTE — Telephone Encounter (Signed)
Pt.notified

## 2020-07-01 NOTE — Telephone Encounter (Signed)
Sent a prescription for tramadol 1 p.o. twice daily as needed 21-day supply 42 tablets to the [facility] on [address] and [address]

## 2020-07-19 ENCOUNTER — Telehealth: Payer: Self-pay | Admitting: Family

## 2020-07-19 NOTE — Telephone Encounter (Signed)
Patient seeking advice for sore throat. Patient states she is currently taking Aleve Appointment made for 2/25, declined sooner appointment due to work schedule.  Seeking advice

## 2020-07-23 ENCOUNTER — Encounter: Payer: 59 | Attending: Physical Medicine & Rehabilitation | Admitting: Physical Medicine & Rehabilitation

## 2020-07-23 ENCOUNTER — Other Ambulatory Visit: Payer: Self-pay

## 2020-07-23 ENCOUNTER — Telehealth (INDEPENDENT_AMBULATORY_CARE_PROVIDER_SITE_OTHER): Payer: 59 | Admitting: Family

## 2020-07-23 ENCOUNTER — Encounter: Payer: Self-pay | Admitting: Physical Medicine & Rehabilitation

## 2020-07-23 VITALS — BP 115/73 | HR 67 | Temp 98.3°F | Ht 67.0 in | Wt 159.6 lb

## 2020-07-23 DIAGNOSIS — J029 Acute pharyngitis, unspecified: Secondary | ICD-10-CM | POA: Diagnosis not present

## 2020-07-23 DIAGNOSIS — M1711 Unilateral primary osteoarthritis, right knee: Secondary | ICD-10-CM | POA: Diagnosis present

## 2020-07-23 MED ORDER — ACETAMINOPHEN-CODEINE #3 300-30 MG PO TABS: 1.0000 | ORAL_TABLET | Freq: Three times a day (TID) | ORAL | 0 refills | Status: DC | PRN

## 2020-07-23 MED ORDER — AMOXICILLIN 875 MG PO TABS
875.0000 mg | ORAL_TABLET | Freq: Two times a day (BID) | ORAL | 0 refills | Status: DC
Start: 2020-07-23 — End: 2020-08-06

## 2020-07-23 NOTE — Progress Notes (Signed)
[name] is a 50 y.o. female with the following history as recorded in [employer]:  Patient Active Problem List   Diagnosis Date Noted  . Cough 09/19/2019  . Patellofemoral arthritis of right knee 06/21/2018  . Cervical radiculopathy 06/21/2018  . Essential hypertension 05/14/2018  . Tobacco abuse 05/14/2018  . Migraine 05/14/2018  . Gout 05/14/2018    Current Outpatient Medications  Medication Sig Dispense Refill  . amoxicillin (AMOXIL) 875 MG tablet Take 1 tablet (875 mg total) by mouth 2 (two) times daily. 20 tablet 0  . acetaminophen-codeine (TYLENOL #3) 300-30 MG tablet Take 1 tablet by mouth every 8 (eight) hours as needed for moderate pain. 21 tablet 0  . amLODipine (NORVASC) 10 MG tablet Take 1 tablet (10 mg total) by mouth daily. 90 tablet 3  . aspirin-acetaminophen-caffeine (EXCEDRIN MIGRAINE) 250-250-65 MG tablet Take 2 tablets by mouth every 6 (six) hours as needed for headache.    Marland Kitchen EYSUVIS 0.25 % SUSP Instill 1 drop into both eyes as directed  1 drop 4 times day for 2 weeks then 1 drop 2 times day for 2 weeks    . RESTASIS 0.05 % ophthalmic emulsion 1 drop 2 (two) times daily.    Marland Kitchen topiramate (TOPAMAX) 25 MG tablet Take 1 tablet (25 mg total) by mouth 2 (two) times daily. 180 tablet 3   No current facility-administered medications for this visit.    Allergies: Xanax [alprazolam]  Past Medical History:  Diagnosis Date  . Gout   . Hypertension   . Migraines     Past Surgical History:  Procedure Laterality Date  . ABDOMINAL HYSTERECTOMY  2008    Family History  Problem Relation Age of Onset  . Hypertension Mother   . Healthy Father     Social History   Tobacco Use  . Smoking status: Current Every Day Smoker    Packs/day: 0.10    Types: Cigarettes  . Smokeless tobacco: Never Used  . Tobacco comment: pack lasts 3 weeks  Substance Use Topics  . Alcohol use: Yes    Comment: occasional    Subjective:   I connected with [name] on  07/23/20 at  3:00 PM EST by a video enabled telemedicine application and verified that I am speaking with the correct person using two identifiers.   I discussed the limitations of evaluation and management by telemedicine and the availability of in person appointments. The patient expressed understanding and agreed to proceed. Provider in office/ patient is at home; provider and patient are only 2 people on video call.   Patient is concerned she is developing strep throat; notes her lymph nodes feel swollen- started with swelling earlier in the week; notes this is how her strep throat always presents;  + sore throat; painful to swallow;    Objective:  There were no vitals filed for this visit.  General: Well developed, well nourished, in no acute distress  Skin : Warm and dry.  Head: Normocephalic and atraumatic  Lungs: Respirations unlabored;  Neurologic: Alert and oriented; speech intact; face symmetrical;   Assessment:  1. Sore throat     Plan:  Will treat based on patient's concern for strep throat; Rx for Amoxicillin 875 mg bid x 10 days; she will follow-up in office if symptoms persist.   No follow-ups on file.  No orders of the defined types were placed in this encounter.   Requested Prescriptions   Signed Prescriptions Disp Refills  . amoxicillin (AMOXIL) 875 MG  tablet 20 tablet 0    Sig: Take 1 tablet (875 mg total) by mouth 2 (two) times daily.

## 2020-07-23 NOTE — Progress Notes (Signed)
Subjective:    Patient ID: [name], female    DOB: November 06, 1970, 50 y.o.   MRN: [id number]  HPI  50 year old female who was referred by orthopedic surgery for chronic right knee pain.  The patient had imaging studies showing mild OA and was not felt to be a candidate for total knee replacement.  MRI of the right knee 03/29/2019 demonstrated no discrete meniscal tears.  Lumbar MRI in 2018 showed mild degenerative disc disease L5-S1.  MRI of the right femur performed on 02/08/2017 demonstrated no abnormalities.  X-rays of the pelvis on 719/2021 demonstrated no significant osteoarthritis or other abnormalities. Severe 10/10 pain RIght knee  The patient had good response to Zilretta injection last performed under ultrasound guidance on 04/06/2020.  The patient feels like it has worn off about a month ago and was hoping to get another injection today.  The patient has had no swelling of the knee she occasionally feels like her knee gives way.  She has had no new symptoms such as numbness or tingling.  She has had no falls.  The patient continues to work full-time at [employer] and is on her feet  pain Inventory Average Pain 10 Pain Right Now 10 My pain is dull  In the last 24 hours, has pain interfered with the following? General activity 10 Relation with others 10 Enjoyment of life 10 What TIME of day is your pain at its worst? morning , daytime, evening and night Sleep (in general) Poor  Pain is worse with: walking, bending and standing Pain improves with: no answer Relief from Meds: na  Family History  Problem Relation Age of Onset  . Hypertension Mother   . Healthy Father    Social History   Socioeconomic History  . Marital status: Single    Spouse name: Not on file  . Number of children: Not on file  . Years of education: Not on file  . Highest education level: Not on file  Occupational History  . Not on file  Tobacco Use  . Smoking status: Current Every Day  Smoker    Packs/day: 0.10    Types: Cigarettes  . Smokeless tobacco: Never Used  . Tobacco comment: pack lasts 3 weeks  Vaping Use  . Vaping Use: Never used  Substance and Sexual Activity  . Alcohol use: Yes    Comment: occasional  . Drug use: No  . Sexual activity: Not Currently    Birth control/protection: Surgical  Other Topics Concern  . Not on file  Social History Narrative  . Not on file   Social Determinants of Health   Financial Resource Strain: Not on file  Food Insecurity: Not on file  Transportation Needs: Not on file  Physical Activity: Not on file  Stress: Not on file  Social Connections: Not on file   Past Surgical History:  Procedure Laterality Date  . ABDOMINAL HYSTERECTOMY  2008   Past Surgical History:  Procedure Laterality Date  . ABDOMINAL HYSTERECTOMY  2008   Past Medical History:  Diagnosis Date  . Gout   . Hypertension   . Migraines    BP 115/73   Pulse 67   Temp 98.3 F (36.8 C)   Ht 5\' 7"  (1.702 m)   Wt 159 lb 9.6 oz (72.4 kg)   SpO2 98%   BMI 25.00 kg/m   Opioid Risk Score:   Fall Risk Score:  `1  Depression screen PHQ 2/9  Depression screen [facility] 2/9 07/23/2020 03/19/2020 05/14/2018  Decreased Interest 0 0 0  Down, Depressed, Hopeless 0 0 1  PHQ - 2 Score 0 0 1  Altered sleeping - 0 -  Tired, decreased energy - 0 -  Change in appetite - 0 -  Feeling bad or failure about yourself  - 0 -  Trouble concentrating - 0 -  Moving slowly or fidgety/restless - 0 -  Suicidal thoughts - 0 -  PHQ-9 Score - 0 -   Review of Systems  Constitutional: Negative.   HENT: Negative.   Eyes: Negative.   Respiratory: Negative.   Cardiovascular: Negative.   Gastrointestinal: Negative.   Endocrine: Negative.   Genitourinary: Negative.   Musculoskeletal:       Knee pain  Skin: Negative.   Allergic/Immunologic: Negative.   Neurological: Negative.   Hematological: Negative.   Psychiatric/Behavioral: Negative.   All other systems reviewed  and are negative.      Objective:   Physical Exam Vitals and nursing note reviewed.  Constitutional:      Appearance: She is normal weight.  HENT:     Head: Normocephalic and atraumatic.  Eyes:     Extraocular Movements: Extraocular movements intact.     Conjunctiva/sclera: Conjunctivae normal.     Pupils: Pupils are equal, round, and reactive to light.  Musculoskeletal:     Right lower leg: No edema.     Left lower leg: No edema.     Comments: Right knee no evidence of effusion there is tenderness to palpation along the medial joint line.  There is no tenderness around the patella, quadriceps tendon or patellar tendon.  The patient has full extension of the knee but flexion is limited to 100 degrees  Skin:    General: Skin is warm and dry.  Neurological:     Mental Status: She is alert and oriented to person, place, and time.  Psychiatric:        Mood and Affect: Mood normal.        Behavior: Behavior normal.           Assessment & Plan:  #1.  Chronic knee pain with osteoarthritis.  Pain interferes with activities such as work and is exacerbated by prolonged standing.  The patient has tried medication management.  Home exercise program given.  A 1 week supply of Tylenol 3 has also been written to be used at night.  Will order repeat right knee Zilretta injection 32 mg triamcinolone under ultrasound guidance.

## 2020-07-23 NOTE — Patient Instructions (Signed)
Journal for Nurse Practitioners, 15(4), 263-267. Retrieved March 04, 2018 from [email or web address].[email or web address]">  Knee Exercises Ask your health care provider which exercises are safe for you. Do exercises exactly as told by your health care provider and adjust them as directed. It is normal to feel mild stretching, pulling, tightness, or discomfort as you do these exercises. Stop right away if you feel sudden pain or your pain gets worse. Do not begin these exercises until told by your health care provider. Stretching and range-of-motion exercises These exercises warm up your muscles and joints and improve the movement and flexibility of your knee. These exercises also help to relieve pain and swelling. Knee extension, prone 1. Lie on your abdomen (prone position) on a bed. 2. Place your left / right knee just beyond the edge of the surface so your knee is not on the bed. You can put a towel under your left / right thigh just above your kneecap for comfort. 3. Relax your leg muscles and allow gravity to straighten your knee (extension). You should feel a stretch behind your left / right knee. 4. Hold this position for __________ seconds. 5. Scoot up so your knee is supported between repetitions. Repeat __________ times. Complete this exercise __________ times a day. Knee flexion, active 1. Lie on your back with both legs straight. If this causes back discomfort, bend your left / right knee so your foot is flat on the floor. 2. Slowly slide your left / right heel back toward your buttocks. Stop when you feel a gentle stretch in the front of your knee or thigh (flexion). 3. Hold this position for __________ seconds. 4. Slowly slide your left / right heel back to the starting position. Repeat __________ times. Complete this exercise __________ times a day.   Quadriceps stretch, prone 1. Lie on your abdomen on a firm surface, such as a bed or padded floor. 2. Bend your left / right knee and hold  your ankle. If you cannot reach your ankle or pant leg, loop a belt around your foot and grab the belt instead. 3. Gently pull your heel toward your buttocks. Your knee should not slide out to the side. You should feel a stretch in the front of your thigh and knee (quadriceps). 4. Hold this position for __________ seconds. Repeat __________ times. Complete this exercise __________ times a day.   Hamstring, supine 1. Lie on your back (supine position). 2. Loop a belt or towel over the ball of your left / right foot. The ball of your foot is on the walking surface, right under your toes. 3. Straighten your left / right knee and slowly pull on the belt to raise your leg until you feel a gentle stretch behind your knee (hamstring). ? Do not let your knee bend while you do this. ? Keep your other leg flat on the floor. 4. Hold this position for __________ seconds. Repeat __________ times. Complete this exercise __________ times a day. Strengthening exercises These exercises build strength and endurance in your knee. Endurance is the ability to use your muscles for a long time, even after they get tired. Quadriceps, isometric This exercise stretches the muscles in front of your thigh (quadriceps) without moving your knee joint (isometric). 1. Lie on your back with your left / right leg extended and your other knee bent. Put a rolled towel or small pillow under your knee if told by your health care provider. 2. Slowly tense the muscles in the front of your   left / right thigh. You should see your kneecap slide up toward your hip or see increased dimpling just above the knee. This motion will push the back of the knee toward the floor. 3. For __________ seconds, hold the muscle as tight as you can without increasing your pain. 4. Relax the muscles slowly and completely. Repeat __________ times. Complete this exercise __________ times a day.   Straight leg raises This exercise stretches the muscles in  front of your thigh (quadriceps) and the muscles that move your hips (hip flexors). 1. Lie on your back with your left / right leg extended and your other knee bent. 2. Tense the muscles in the front of your left / right thigh. You should see your kneecap slide up or see increased dimpling just above the knee. Your thigh may even shake a bit. 3. Keep these muscles tight as you raise your leg 4-6 inches (10-15 cm) off the floor. Do not let your knee bend. 4. Hold this position for __________ seconds. 5. Keep these muscles tense as you lower your leg. 6. Relax your muscles slowly and completely after each repetition. Repeat __________ times. Complete this exercise __________ times a day. Hamstring, isometric 1. Lie on your back on a firm surface. 2. Bend your left / right knee about __________ degrees. 3. Dig your left / right heel into the surface as if you are trying to pull it toward your buttocks. Tighten the muscles in the back of your thighs (hamstring) to "dig" as hard as you can without increasing any pain. 4. Hold this position for __________ seconds. 5. Release the tension gradually and allow your muscles to relax completely for __________ seconds after each repetition. Repeat __________ times. Complete this exercise __________ times a day. Hamstring curls If told by your health care provider, do this exercise while wearing ankle weights. Begin with __________ lb weights. Then increase the weight by 1 lb (0.5 kg) increments. Do not wear ankle weights that are more than __________ lb. 1. Lie on your abdomen with your legs straight. 2. Bend your left / right knee as far as you can without feeling pain. Keep your hips flat against the floor. 3. Hold this position for __________ seconds. 4. Slowly lower your leg to the starting position. Repeat __________ times. Complete this exercise __________ times a day.   Squats This exercise strengthens the muscles in front of your thigh and knee  (quadriceps). 1. Stand in front of a table, with your feet and knees pointing straight ahead. You may rest your hands on the table for balance but not for support. 2. Slowly bend your knees and lower your hips like you are going to sit in a chair. ? Keep your weight over your heels, not over your toes. ? Keep your lower legs upright so they are parallel with the table legs. ? Do not let your hips go lower than your knees. ? Do not bend lower than told by your health care provider. ? If your knee pain increases, do not bend as low. 3. Hold the squat position for __________ seconds. 4. Slowly push with your legs to return to standing. Do not use your hands to pull yourself to standing. Repeat __________ times. Complete this exercise __________ times a day. Wall slides This exercise strengthens the muscles in front of your thigh and knee (quadriceps). 1. Lean your back against a smooth wall or door, and walk your feet out 18-24 inches (46-61 cm) from it. 2.   Place your feet hip-width apart. 3. Slowly slide down the wall or door until your knees bend __________ degrees. Keep your knees over your heels, not over your toes. Keep your knees in line with your hips. 4. Hold this position for __________ seconds. Repeat __________ times. Complete this exercise __________ times a day.   Straight leg raises This exercise strengthens the muscles that rotate the leg at the hip and move it away from your body (hip abductors). 1. Lie on your side with your left / right leg in the top position. Lie so your head, shoulder, knee, and hip line up. You may bend your bottom knee to help you keep your balance. 2. Roll your hips slightly forward so your hips are stacked directly over each other and your left / right knee is facing forward. 3. Leading with your heel, lift your top leg 4-6 inches (10-15 cm). You should feel the muscles in your outer hip lifting. ? Do not let your foot drift forward. ? Do not let your  knee roll toward the ceiling. 4. Hold this position for __________ seconds. 5. Slowly return your leg to the starting position. 6. Let your muscles relax completely after each repetition. Repeat __________ times. Complete this exercise __________ times a day.   Straight leg raises This exercise stretches the muscles that move your hips away from the front of the pelvis (hip extensors). 1. Lie on your abdomen on a firm surface. You can put a pillow under your hips if that is more comfortable. 2. Tense the muscles in your buttocks and lift your left / right leg about 4-6 inches (10-15 cm). Keep your knee straight as you lift your leg. 3. Hold this position for __________ seconds. 4. Slowly lower your leg to the starting position. 5. Let your leg relax completely after each repetition. Repeat __________ times. Complete this exercise __________ times a day. This information is not intended to replace advice given to you by your health care provider. Make sure you discuss any questions you have with your health care provider. Document Revised: 03/05/2018 Document Reviewed: 03/05/2018 Elsevier Patient Education  2021 Elsevier Inc.  

## 2020-07-30 ENCOUNTER — Ambulatory Visit: Payer: 59 | Admitting: Physical Medicine & Rehabilitation

## 2020-08-06 ENCOUNTER — Encounter: Payer: 59 | Attending: Physical Medicine & Rehabilitation | Admitting: Physical Medicine & Rehabilitation

## 2020-08-06 ENCOUNTER — Other Ambulatory Visit: Payer: Self-pay

## 2020-08-06 ENCOUNTER — Encounter: Payer: Self-pay | Admitting: Physical Medicine & Rehabilitation

## 2020-08-06 VITALS — BP 130/80 | HR 82 | Temp 98.2°F | Ht 67.0 in | Wt 163.4 lb

## 2020-08-06 DIAGNOSIS — M1711 Unilateral primary osteoarthritis, right knee: Secondary | ICD-10-CM | POA: Diagnosis present

## 2020-08-06 NOTE — Patient Instructions (Signed)
Triamcinolone extended-release injection What is this medicine? TRIAMCINOLONE (trye am SIN oh lone) is a corticosteroid. It helps to reduce swelling and treat pain. This medicine is used to treat arthritis in the knee. This medicine may be used for other purposes; ask your health care provider or pharmacist if you have questions. COMMON BRAND NAME(S): Zilretta What should I tell my health care provider before I take this medicine? They need to know if you have any of these conditions:  Cushing's syndrome  diabetes  glaucoma  heart disease  high blood pressure  infection, like tuberculosis, herpes, measles, chickenpox, or fungal infection  liver disease  low levels of potassium in the blood  mental illness  myasthenia gravis  recent heart attack  seizures  stomach or intestine disease  thyroid disease  an unusual or allergic reaction to triamcinolone, corticosteroids, benzyl alcohol, other medicines, foods, dyes, or preservatives  pregnant or trying to get pregnant  breast-feeding How should I use this medicine? This medicine is injected into the joint by a health care professional. After your dose follow your doctor's instructions for your care. Contact your pediatrician regarding the use of this medicine in children. Special care may be needed. Overdosage: If you think you have taken too much of this medicine contact a poison control center or emergency room at once. NOTE: This medicine is only for you. Do not share this medicine with others. What if I miss a dose? This does not apply. What may interact with this medicine?  antiviral medicines for HIV or AIDS  aspirin  certain medicines for fungal infections like ketoconazole and itraconazole  clarithromycin  mifepristone  nefazodone  other steroid medicines  vaccines and other immunization products This list may not describe all possible interactions. Give your health care provider a list of all the  medicines, herbs, non-prescription drugs, or dietary supplements you use. Also tell them if you smoke, drink alcohol, or use illegal drugs. Some items may interact with your medicine. What should I watch for while using this medicine? Visit your doctor or health care professional for regular checks on your progress. If you are taking this medicine for a long time, carry an identification card with your name, the type and dose of medicine, and your doctor's name and address. Do not come in contact with people who have chickenpox or the measles while you are taking this medicine. If you do, call your doctor right away. This medicine may increase blood sugar. Ask your healthcare provider if changes in diet or medicines are needed if you have diabetes. What side effects may I notice from receiving this medicine? Side effects that you should report to your doctor or health care professional as soon as possible:  allergic reactions like skin rash, itching or hives, swelling of the face, lips, or tongue  bloody or black, tarry stools  changes in emotions or moods  excessive hair growth on the face or body  eye pain  increased blood pressure  increased joint pain and swelling at site where injected  lumpy, thin skin at site where injected  rounding of face  seizures  signs and symptoms of high blood sugar such as being more thirsty or hungry or having to urinate more than normal. You may also feel very tired or have blurry vision.  signs and symptoms of infection like fever or chills; cough; sore throat; pain or trouble passing urine  sores that do not heal  stomach pain  swelling of ankles, feet, hands  trouble sleeping  unusual bruising or bleeding Side effects that usually do not require medical attention (report these to your doctor or health care professional if they continue or are bothersome):  headache  nausea  pain, redness, or irritation at site where  injected  upset stomach  weight gain This list may not describe all possible side effects. Call your doctor for medical advice about side effects. You may report side effects to FDA at 1-800-FDA-1088. Where should I keep my medicine? This drug is given in a hospital or clinic and will not be stored at home. NOTE: This sheet is a summary. It may not cover all possible information. If you have questions about this medicine, talk to your doctor, pharmacist, or health care provider.  2021 Elsevier/Gold Standard (2018-02-14 11:00:49)

## 2020-08-06 NOTE — Progress Notes (Signed)
RIGHT Knee injection  ultrasound guidance)  Indication:RIGHT Knee pain not relieved by medication management and other conservative care.  Informed consent was obtained after describing risks and benefits of the procedure with the patient, this includes bleeding, bruising, infection and medication side effects. The patient wishes to proceed and has given written consent. The patient was placed in a recumbent position. The medial aspect of the knee was marked and prepped with Betadine and alcohol. It was then entered with a 25-gauge 1-1/2 inch needle and 1 mL of 1% lidocaine was injected into the skin and subcutaneous tissue. Then another 21g 2"needle was inserted into the knee joint. After negative draw back for blood, a solution containing ofZilretta (32mg  Triamcinolone) injected. The patient tolerated the procedure well. Post procedure instructions were given.

## 2020-09-17 ENCOUNTER — Other Ambulatory Visit: Payer: Self-pay | Admitting: Family

## 2020-09-17 NOTE — Telephone Encounter (Signed)
A 90 day supply of medication has been sent. I have called pt and notified her. Pt also reports that she is going to stay to stay at [facility] to continue her care.

## 2020-09-18 IMAGING — DX DG CERVICAL SPINE COMPLETE 4+V
6 series · 6 of 6 positions shown · non-contrast
Comparison: None.

CLINICAL DATA: 47-year-old female with neck pain and stiffness for
the past week. No known injury. Initial encounter.

EXAM:
CERVICAL SPINE - COMPLETE 4+ VIEW

[c-spine lat]
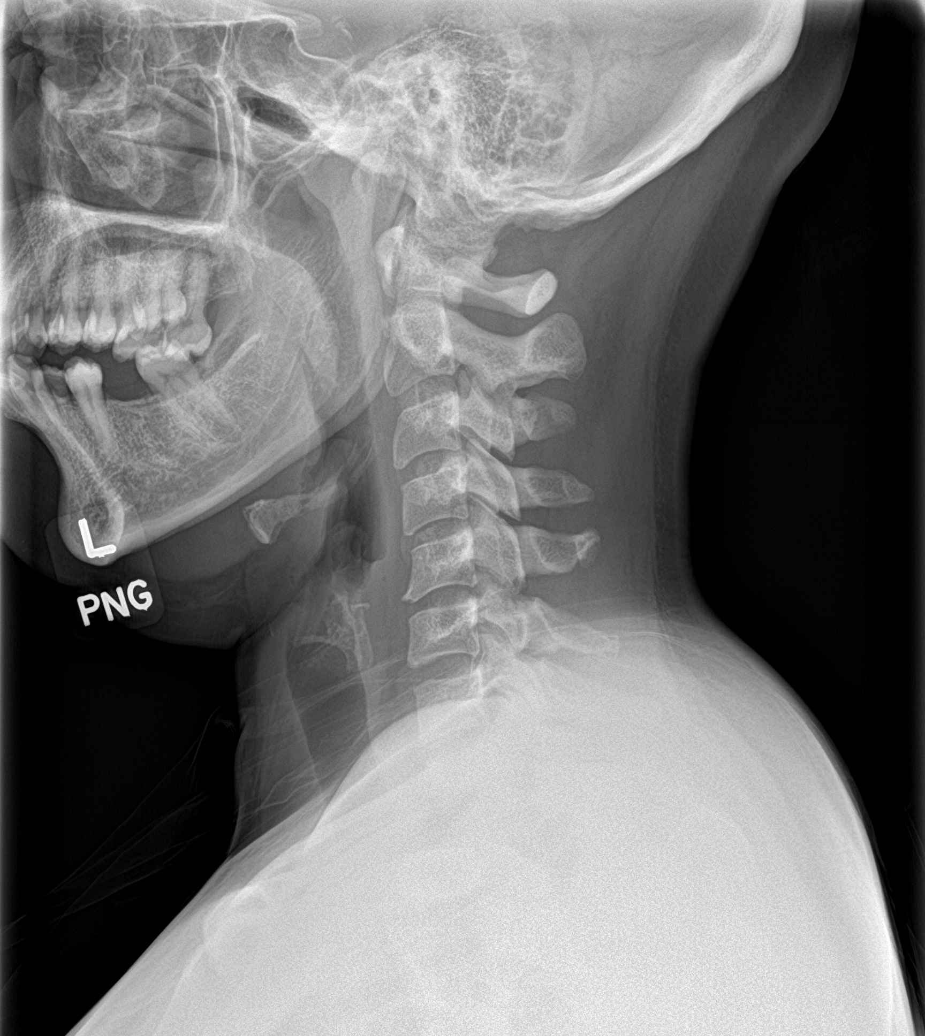

[c-spine obl (1 of 2)]
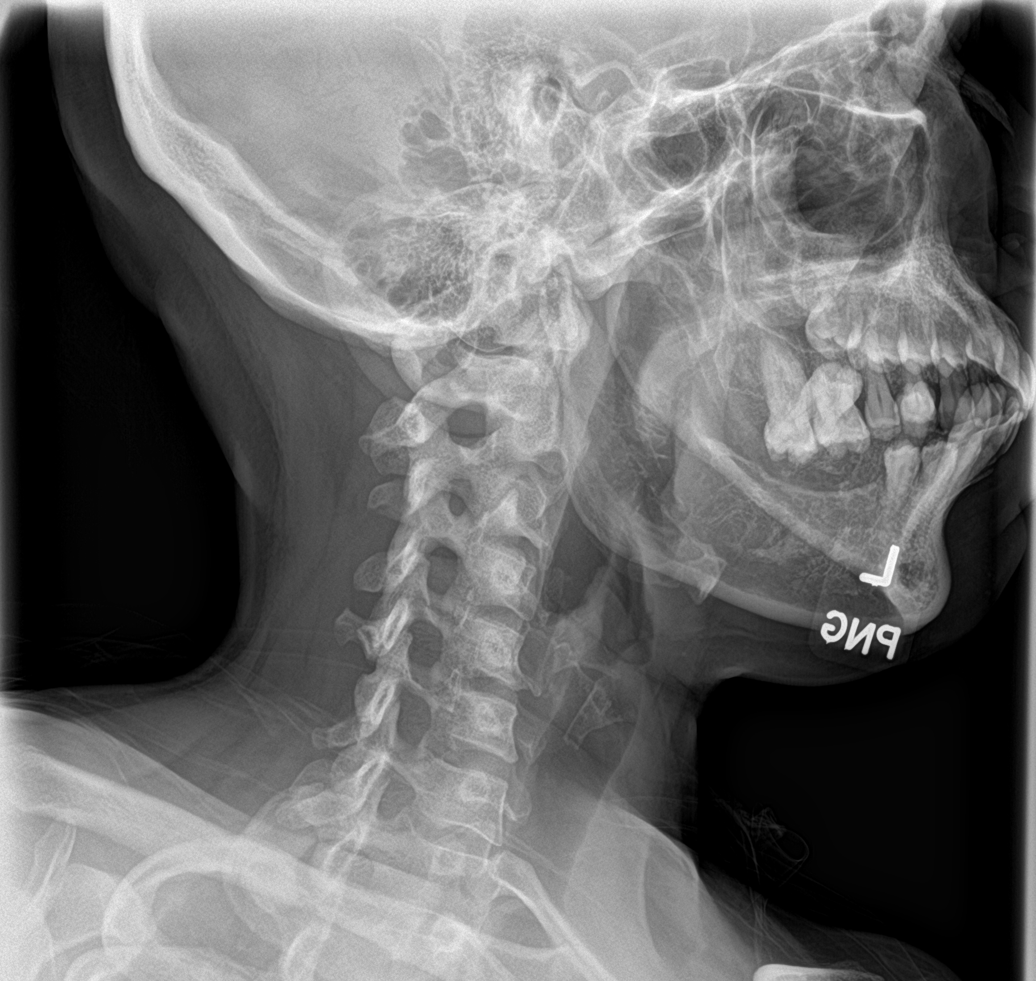

[c-spine obl (2 of 2)]
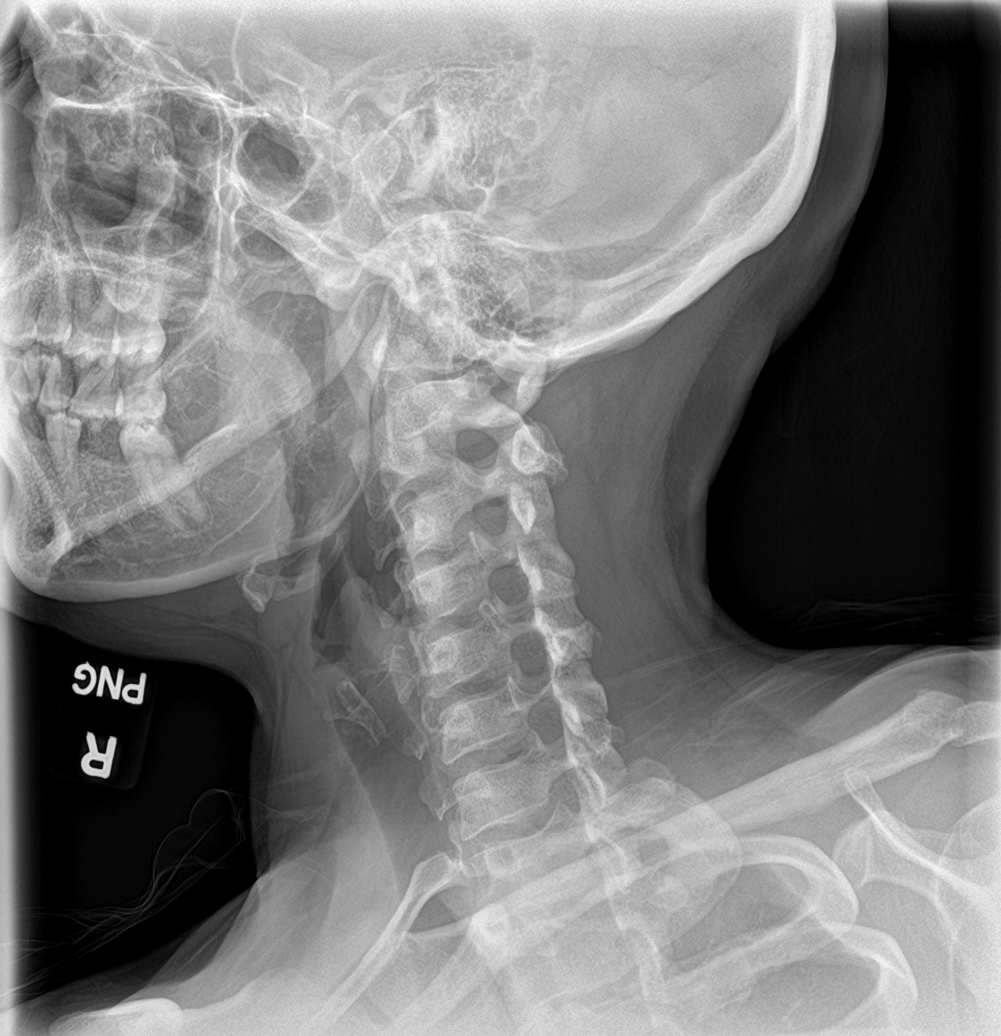

[c-spine ap]
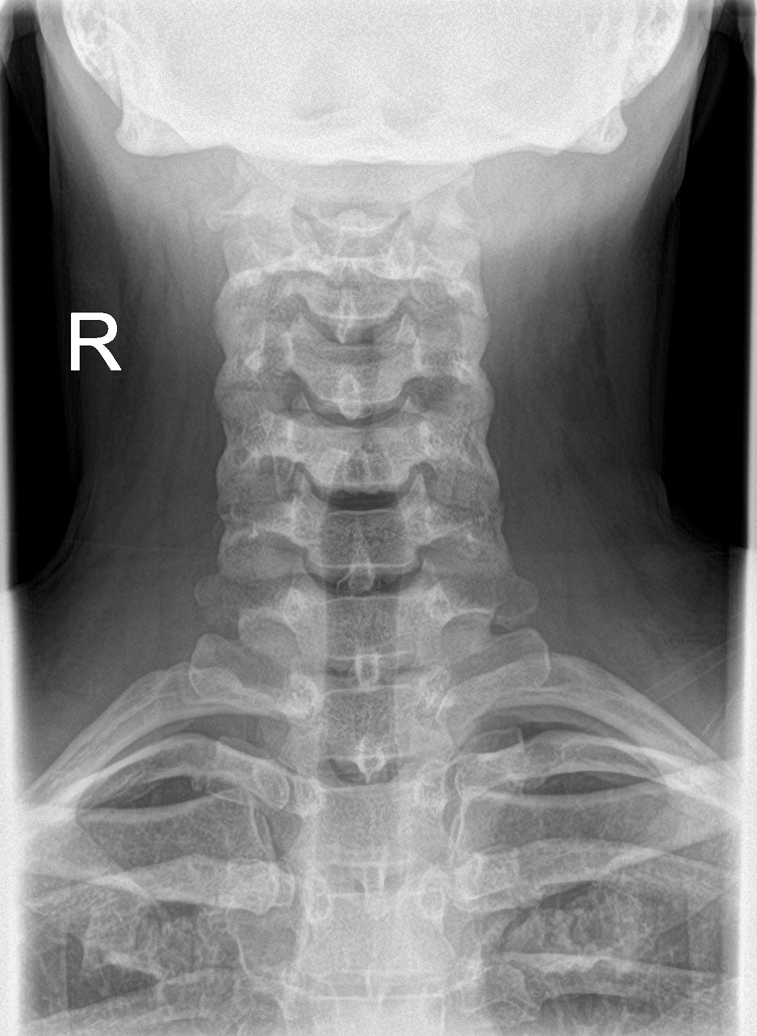

[c-spine open mouth]
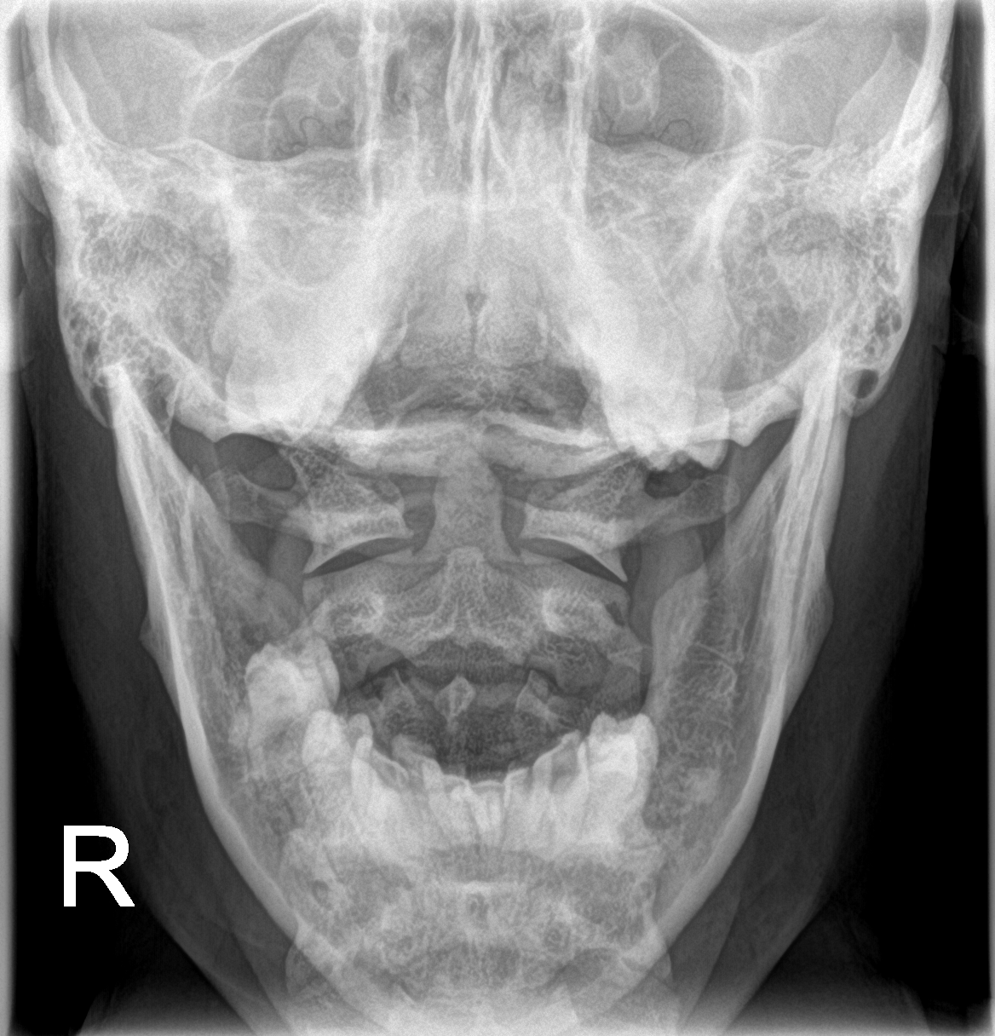

[swimmer]
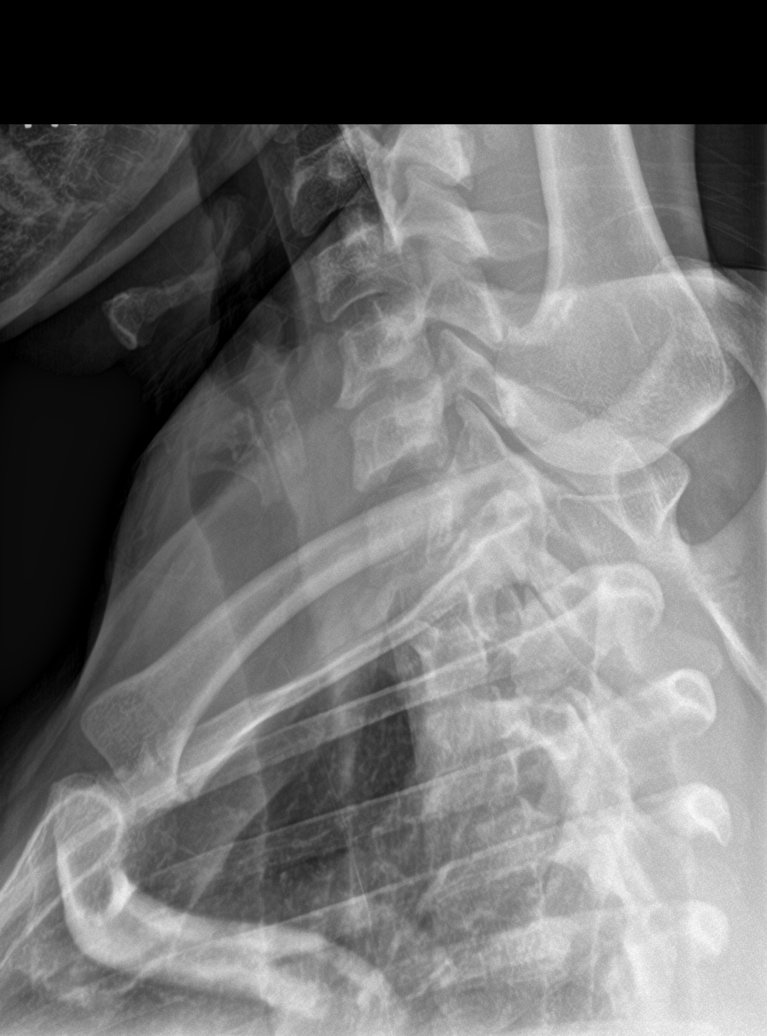

[6 of 6 positions shown; findings below may reference images not displayed]

FINDINGS: Straightening of the cervical spine. No fracture or abnormal
prevertebral soft tissue swelling noted. Minimal narrowing anterior
aspect C5-6 disc space with small anterior spur. No bony neural
foraminal narrowing. No worrisome lung apical lesion. Minimal
pleural thickening.
IMPRESSION: 1. Straightening of the cervical spine.
2. Minimal narrowing anterior aspect C5-6 disc space with small
anterior spur.

## 2020-09-29 ENCOUNTER — Other Ambulatory Visit: Payer: Self-pay

## 2020-09-29 ENCOUNTER — Encounter (HOSPITAL_COMMUNITY): Payer: Self-pay

## 2020-09-29 ENCOUNTER — Ambulatory Visit (HOSPITAL_COMMUNITY)
Admission: RE | Admit: 2020-09-29 | Discharge: 2020-09-29 | Disposition: A | Discharge status: Home / Self Care | Payer: 59 | Source: Ambulatory Visit | Attending: Family Medicine | Admitting: Family Medicine

## 2020-09-29 VITALS — BP 122/81 | HR 78 | Temp 98.1°F | Resp 18

## 2020-09-29 DIAGNOSIS — S76312A Strain of muscle, fascia and tendon of the posterior muscle group at thigh level, left thigh, initial encounter: Secondary | ICD-10-CM

## 2020-09-29 DIAGNOSIS — H1013 Acute atopic conjunctivitis, bilateral: Secondary | ICD-10-CM | POA: Diagnosis not present

## 2020-09-29 MED ORDER — IBUPROFEN 800 MG PO TABS
800.0000 mg | ORAL_TABLET | Freq: Three times a day (TID) | ORAL | 0 refills | Status: DC
Start: 2020-09-29 — End: 2021-05-05

## 2020-09-29 MED ORDER — OLOPATADINE HCL 0.2 % OP SOLN
1.0000 [drp] | Freq: Every day | OPHTHALMIC | 0 refills | Status: DC
Start: 2020-09-29 — End: 2021-09-15

## 2020-09-29 NOTE — ED Triage Notes (Signed)
Pt presents with bilateral eye drainage, crustiness, and itching X 2 days.

## 2020-09-29 NOTE — ED Provider Notes (Signed)
  [facility]   [id number] 09/29/20 Arrival Time: 1517  ASSESSMENT & PLAN:  1. Allergic conjunctivitis of both eyes   2. Strain of left hamstring, initial encounter     Meds ordered this encounter  Medications  . ibuprofen (ADVIL) 800 MG tablet    Sig: Take 1 tablet (800 mg total) by mouth 3 (three) times daily with meals.    Dispense:  21 tablet    Refill:  0  . Olopatadine HCl 0.2 % SOLN    Sig: Apply 1 drop to eye daily.    Dispense:  2.5 mL    Refill:  0   Ophthalmic drops per orders. Warm compress to eye(s). Local eye care discussed.  WBAT regarding hamstring strain. Expect soreness/stiffness. Declines work note.  Reviewed expectations re: course of current medical issues. Questions answered. Outlined signs and symptoms indicating need for more acute intervention. Patient verbalized understanding. After Visit Summary given.   SUBJECTIVE:  [name] is a 50 y.o. female who presents with complaint of bilateral eye itching with watery drainage; works at [employer]; around plants; questions relation. Injury: no. Visual changes: no. Contact lens use: no. Recent illness: no. Self treatment: none reported.  Also reports "pulling a muscle" at work; posterior L thigh; noted today. Pain with movement. No extremity sensation changes or weakness. No tx PTA.  OBJECTIVE:  Vitals:   09/29/20 1554  BP: 122/81  Pulse: 78  Resp: 18  Temp: 98.1 F (36.7 C)  TempSrc: Oral  SpO2: 98%    General appearance: alert; no distress HEENT: [place]; AT; PERRLA; no restriction of the extraocular movements OU: without reported pain; with conjunctival injection; with watery drainage; without corneal opacities; without limbal flush; without periorbital swelling or erythema Neck: supple without LAD Lungs: unlabored respirations Skin: warm and dry Psychological: alert and cooperative; normal mood and affect     Allergies  Allergen Reactions  . Xanax [Alprazolam] Itching     Past Medical History:  Diagnosis Date  . Gout   . Hypertension   . Migraines    Social History   Socioeconomic History  . Marital status: Single    Spouse name: Not on file  . Number of children: Not on file  . Years of education: Not on file  . Highest education level: Not on file  Occupational History  . Not on file  Tobacco Use  . Smoking status: Current Every Day Smoker    Packs/day: 0.10    Types: Cigarettes  . Smokeless tobacco: Never Used  . Tobacco comment: pack lasts 3 weeks  Vaping Use  . Vaping Use: Never used  Substance and Sexual Activity  . Alcohol use: Yes    Comment: occasional  . Drug use: No  . Sexual activity: Not Currently    Birth control/protection: Surgical  Other Topics Concern  . Not on file  Social History Narrative  . Not on file   Social Determinants of Health   Financial Resource Strain: Not on file  Food Insecurity: Not on file  Transportation Needs: Not on file  Physical Activity: Not on file  Stress: Not on file  Social Connections: Not on file  Intimate Partner Violence: Not on file   Family History  Problem Relation Age of Onset  . Hypertension Mother   . Healthy Father    Past Surgical History:  Procedure Laterality Date  . ABDOMINAL HYSTERECTOMY  2008     Mardella Layman, MD 09/29/20 1623

## 2020-11-03 ENCOUNTER — Other Ambulatory Visit: Payer: Self-pay

## 2020-11-03 ENCOUNTER — Encounter: Payer: Self-pay | Admitting: Emergency Medicine

## 2020-11-03 ENCOUNTER — Ambulatory Visit (INDEPENDENT_AMBULATORY_CARE_PROVIDER_SITE_OTHER): Payer: 59 | Admitting: Emergency Medicine

## 2020-11-03 VITALS — BP 116/82 | HR 82 | Temp 98.1°F | Ht 67.0 in | Wt 162.8 lb

## 2020-11-03 DIAGNOSIS — I10 Essential (primary) hypertension: Secondary | ICD-10-CM

## 2020-11-03 DIAGNOSIS — Z7689 Persons encountering health services in other specified circumstances: Secondary | ICD-10-CM | POA: Diagnosis not present

## 2020-11-03 DIAGNOSIS — G43809 Other migraine, not intractable, without status migrainosus: Secondary | ICD-10-CM

## 2020-11-03 DIAGNOSIS — Z720 Tobacco use: Secondary | ICD-10-CM

## 2020-11-03 DIAGNOSIS — F17200 Nicotine dependence, unspecified, uncomplicated: Secondary | ICD-10-CM

## 2020-11-03 DIAGNOSIS — Z8669 Personal history of other diseases of the nervous system and sense organs: Secondary | ICD-10-CM | POA: Diagnosis not present

## 2020-11-03 MED ORDER — TOPIRAMATE 25 MG PO TABS: 25.0000 mg | ORAL_TABLET | Freq: Two times a day (BID) | ORAL | 3 refills | Status: DC

## 2020-11-03 NOTE — Assessment & Plan Note (Signed)
Well controlled migraine headache frequency.  Continue Topamax 25 mg twice a day.

## 2020-11-03 NOTE — Progress Notes (Signed)
[name] 50 y.o.   Chief Complaint  Patient presents with  . Transitions Of Care    HISTORY OF PRESENT ILLNESS: This is a 50 y.o. female here to establish care with me. Has history of hypertension on amlodipine 10 mg daily. Also has history of chronic pain to right shoulder, right knee and right ankle most likely related to daily activities at work. Has history of chronic migraines and takes Topamax 25 mg twice a day. Smokes about 2 cigarettes/day. No complaints or medical concerns today.  HPI   Prior to Admission medications   Medication Sig Start Date End Date Taking? Authorizing Provider  acetaminophen-codeine (TYLENOL #3) 300-30 MG tablet Take 1 tablet by mouth every 8 (eight) hours as needed for moderate pain. 07/23/20   Erick ColaceKirsteins, Andrew E, MD  amLODipine (NORVASC) 10 MG tablet TAKE 1 TABLET(10 MG) BY MOUTH DAILY 09/17/20   Olive BassMurray, Laura Woodruff, FNP  aspirin-acetaminophen-caffeine [facility](EXCEDRIN MIGRAINE) [id number] MG tablet Take 2 tablets by mouth every 6 (six) hours as needed for headache.    [provider]  EYSUVIS 0.25 % SUSP Instill 1 drop into both eyes as directed  1 drop 4 times day for 2 weeks then 1 drop 2 times day for 2 weeks 11/24/19   [provider]  ibuprofen (ADVIL) 800 MG tablet Take 1 tablet (800 mg total) by mouth 3 (three) times daily with meals. 09/29/20   Mardella LaymanHagler, Brian, MD  Olopatadine HCl 0.2 % SOLN Apply 1 drop to eye daily. 09/29/20   Mardella LaymanHagler, Brian, MD  RESTASIS 0.05 % ophthalmic emulsion 1 drop 2 (two) times daily. 11/24/19   [provider]  topiramate (TOPAMAX) 25 MG tablet Take 1 tablet (25 mg total) by mouth 2 (two) times daily. 01/29/20   Olive BassMurray, Laura Woodruff, FNP    Allergies  Allergen Reactions  . Xanax [Alprazolam] Itching    Patient Active Problem List   Diagnosis Date Noted  . Patellofemoral arthritis of right knee 06/21/2018  . Cervical radiculopathy 06/21/2018  . Essential hypertension 05/14/2018  .  Tobacco abuse 05/14/2018  . Gout 05/14/2018    Past Medical History:  Diagnosis Date  . Gout   . Hypertension   . Migraines     Past Surgical History:  Procedure Laterality Date  . ABDOMINAL HYSTERECTOMY  2008    Social History   Socioeconomic History  . Marital status: Single    Spouse name: Not on file  . Number of children: Not on file  . Years of education: Not on file  . Highest education level: Not on file  Occupational History  . Not on file  Tobacco Use  . Smoking status: Current Every Day Smoker    Packs/day: 0.10    Types: Cigarettes  . Smokeless tobacco: Never Used  . Tobacco comment: pack lasts 3 weeks  Vaping Use  . Vaping Use: Never used  Substance and Sexual Activity  . Alcohol use: Yes    Comment: occasional  . Drug use: No  . Sexual activity: Not Currently    Birth control/protection: Surgical  Other Topics Concern  . Not on file  Social History Narrative  . Not on file   Social Determinants of Health   Financial Resource Strain: Not on file  Food Insecurity: Not on file  Transportation Needs: Not on file  Physical Activity: Not on file  Stress: Not on file  Social Connections: Not on file  Intimate Partner Violence: Not on file    Family History  Problem Relation Age of Onset  . Hypertension Mother   . Healthy Father      Review of Systems  Constitutional: Negative.  Negative for chills and fever.  HENT: Negative.  Negative for congestion and sore throat.   Respiratory: Negative.  Negative for cough and shortness of breath.   Cardiovascular: Negative.  Negative for chest pain and palpitations.  Gastrointestinal: Negative for abdominal pain, diarrhea, nausea and vomiting.  Genitourinary: Negative.  Negative for dysuria and hematuria.  Skin: Negative.  Negative for rash.  Neurological: Negative.  Negative for dizziness and headaches.  All other systems reviewed and are negative.   Today's Vitals   11/03/20 1018  BP: 116/82   Pulse: 82  Temp: 98.1 F (36.7 C)  TempSrc: Oral  SpO2: 98%  Weight: 162 lb 12.8 oz (73.8 kg)  Height: 5\' 7"  (1.702 m)   Body mass index is 25.5 kg/m. Wt Readings from Last 3 Encounters:  11/03/20 162 lb 12.8 oz (73.8 kg)  08/06/20 163 lb 6.4 oz (74.1 kg)  07/23/20 159 lb 9.6 oz (72.4 kg)    Physical Exam Vitals reviewed.  Constitutional:      Appearance: Normal appearance.  HENT:     Head: Normocephalic.  Eyes:     Extraocular Movements: Extraocular movements intact.     Conjunctiva/sclera: Conjunctivae normal.     Pupils: Pupils are equal, round, and reactive to light.  Cardiovascular:     Rate and Rhythm: Normal rate and regular rhythm.     Pulses: Normal pulses.     Heart sounds: Normal heart sounds.  Pulmonary:     Effort: Pulmonary effort is normal.     Breath sounds: Normal breath sounds.  Musculoskeletal:     Cervical back: Normal range of motion and neck supple. No tenderness.  Skin:    General: Skin is warm and dry.  Neurological:     General: No focal deficit present.     Mental Status: She is alert and oriented to person, place, and time.  Psychiatric:        Mood and Affect: Mood normal.        Behavior: Behavior normal.      ASSESSMENT & PLAN: A total of 30 minutes was spent with the patient and counseling/coordination of care regarding establishing care with me, hypertension and cardiovascular risks associated with this condition, review of all medications, smoking and cardiovascular and cancer risks associated with this condition, smoking cessation advice, migraines under chronic treatments, education on nutrition and dietary approach to control hypertension, health maintenance items, soft tissue chronic pain and its management, prognosis, documentation and need for follow-up.  Essential hypertension Well-controlled hypertension.  Continue amlodipine 10 mg daily.  Migraine Well controlled migraine headache frequency.  Continue Topamax 25 mg  twice a day.  Tobacco abuse Smoking less.  2 cigarettes/day.  Cardiovascular and cancer risks associated with smoking discussed. Smoking cessation advice given.  [name] was seen today for transitions of care and medication refill.  Diagnoses and all orders for this visit:  Essential hypertension  Encounter to establish care  History of migraine  Current smoker  Other migraine without status migrainosus, not intractable  Tobacco abuse  Other orders -     topiramate (TOPAMAX) 25 MG tablet; Take 1 tablet (25 mg total) by mouth 2 (two) times daily.     Patient Instructions   Health Maintenance, Female Adopting a healthy lifestyle and getting preventive care are important in promoting health and wellness. Ask your health  care provider about:  The right schedule for you to have regular tests and exams.  Things you can do on your own to prevent diseases and keep yourself healthy. What should I know about diet, weight, and exercise? Eat a healthy diet  Eat a diet that includes plenty of vegetables, fruits, low-fat dairy products, and lean protein.  Do not eat a lot of foods that are high in solid fats, added sugars, or sodium.   Maintain a healthy weight Body mass index (BMI) is used to identify weight problems. It estimates body fat based on height and weight. Your health care provider can help determine your BMI and help you achieve or maintain a healthy weight. Get regular exercise Get regular exercise. This is one of the most important things you can do for your health. Most adults should:  Exercise for at least 150 minutes each week. The exercise should increase your heart rate and make you sweat (moderate-intensity exercise).  Do strengthening exercises at least twice a week. This is in addition to the moderate-intensity exercise.  Spend less time sitting. Even light physical activity can be beneficial. Watch cholesterol and blood lipids Have your blood tested for  lipids and cholesterol at 50 years of age, then have this test every 5 years. Have your cholesterol levels checked more often if:  Your lipid or cholesterol levels are high.  You are older than 50 years of age.  You are at high risk for heart disease. What should I know about cancer screening? Depending on your health history and family history, you may need to have cancer screening at various ages. This may include screening for:  Breast cancer.  Cervical cancer.  Colorectal cancer.  Skin cancer.  Lung cancer. What should I know about heart disease, diabetes, and high blood pressure? Blood pressure and heart disease  High blood pressure causes heart disease and increases the risk of stroke. This is more likely to develop in people who have high blood pressure readings, are of African descent, or are overweight.  Have your blood pressure checked: ? Every 3-5 years if you are 37-105 years of age. ? Every year if you are 31 years old or older. Diabetes Have regular diabetes screenings. This checks your fasting blood sugar level. Have the screening done:  Once every three years after age 54 if you are at a normal weight and have a low risk for diabetes.  More often and at a younger age if you are overweight or have a high risk for diabetes. What should I know about preventing infection? Hepatitis B If you have a higher risk for hepatitis B, you should be screened for this virus. Talk with your health care provider to find out if you are at risk for hepatitis B infection. Hepatitis C Testing is recommended for:  Everyone born from [birth year] through 1965.  Anyone with known risk factors for hepatitis C. Sexually transmitted infections (STIs)  Get screened for STIs, including gonorrhea and chlamydia, if: ? You are sexually active and are younger than 51 years of age. ? You are older than 50 years of age and your health care provider tells you that you are at risk for this type of  infection. ? Your sexual activity has changed since you were last screened, and you are at increased risk for chlamydia or gonorrhea. Ask your health care provider if you are at risk.  Ask your health care provider about whether you are at high risk for HIV.  Your health care provider may recommend a prescription medicine to help prevent HIV infection. If you choose to take medicine to prevent HIV, you should first get tested for HIV. You should then be tested every 3 months for as long as you are taking the medicine. Pregnancy  If you are about to stop having your period (premenopausal) and you may become pregnant, seek counseling before you get pregnant.  Take 400 to 800 micrograms (mcg) of folic acid every day if you become pregnant.  Ask for birth control (contraception) if you want to prevent pregnancy. Osteoporosis and menopause Osteoporosis is a disease in which the bones lose minerals and strength with aging. This can result in bone fractures. If you are 70 years old or older, or if you are at risk for osteoporosis and fractures, ask your health care provider if you should:  Be screened for bone loss.  Take a calcium or vitamin D supplement to lower your risk of fractures.  Be given hormone replacement therapy (HRT) to treat symptoms of menopause. Follow these instructions at home: Lifestyle  Do not use any products that contain nicotine or tobacco, such as cigarettes, e-cigarettes, and chewing tobacco. If you need help quitting, ask your health care provider.  Do not use street drugs.  Do not share needles.  Ask your health care provider for help if you need support or information about quitting drugs. Alcohol use  Do not drink alcohol if: ? Your health care provider tells you not to drink. ? You are pregnant, may be pregnant, or are planning to become pregnant.  If you drink alcohol: ? Limit how much you use to 0-1 drink a day. ? Limit intake if you are  breastfeeding.  Be aware of how much alcohol is in your drink. In the [place] one drink equals one 12 oz bottle of beer (355 mL), one 5 oz glass of wine (148 mL), or one 1 oz glass of hard liquor (44 mL). General instructions  Schedule regular health, dental, and eye exams.  Stay current with your vaccines.  Tell your health care provider if: ? You often feel depressed. ? You have ever been abused or do not feel safe at home. Summary  Adopting a healthy lifestyle and getting preventive care are important in promoting health and wellness.  Follow your health care provider's instructions about healthy diet, exercising, and getting tested or screened for diseases.  Follow your health care provider's instructions on monitoring your cholesterol and blood pressure. This information is not intended to replace advice given to you by your health care provider. Make sure you discuss any questions you have with your health care provider. Document Revised: 05/08/2018 Document Reviewed: 05/08/2018 Elsevier Patient Education  2021 Elsevier Inc.     Edwina Barth, MD [facility]

## 2020-11-03 NOTE — Patient Instructions (Signed)
Health Maintenance, Female Adopting a healthy lifestyle and getting preventive care are important in promoting health and wellness. Ask your health care provider about:  The right schedule for you to have regular tests and exams.  Things you can do on your own to prevent diseases and keep yourself healthy. What should I know about diet, weight, and exercise? Eat a healthy diet  Eat a diet that includes plenty of vegetables, fruits, low-fat dairy products, and lean protein.  Do not eat a lot of foods that are high in solid fats, added sugars, or sodium.   Maintain a healthy weight Body mass index (BMI) is used to identify weight problems. It estimates body fat based on height and weight. Your health care provider can help determine your BMI and help you achieve or maintain a healthy weight. Get regular exercise Get regular exercise. This is one of the most important things you can do for your health. Most adults should:  Exercise for at least 150 minutes each week. The exercise should increase your heart rate and make you sweat (moderate-intensity exercise).  Do strengthening exercises at least twice a week. This is in addition to the moderate-intensity exercise.  Spend less time sitting. Even light physical activity can be beneficial. Watch cholesterol and blood lipids Have your blood tested for lipids and cholesterol at 50 years of age, then have this test every 5 years. Have your cholesterol levels checked more often if:  Your lipid or cholesterol levels are high.  You are older than 50 years of age.  You are at high risk for heart disease. What should I know about cancer screening? Depending on your health history and family history, you may need to have cancer screening at various ages. This may include screening for:  Breast cancer.  Cervical cancer.  Colorectal cancer.  Skin cancer.  Lung cancer. What should I know about heart disease, diabetes, and high blood  pressure? Blood pressure and heart disease  High blood pressure causes heart disease and increases the risk of stroke. This is more likely to develop in people who have high blood pressure readings, are of African descent, or are overweight.  Have your blood pressure checked: ? Every 3-5 years if you are 18-39 years of age. ? Every year if you are 40 years old or older. Diabetes Have regular diabetes screenings. This checks your fasting blood sugar level. Have the screening done:  Once every three years after age 40 if you are at a normal weight and have a low risk for diabetes.  More often and at a younger age if you are overweight or have a high risk for diabetes. What should I know about preventing infection? Hepatitis B If you have a higher risk for hepatitis B, you should be screened for this virus. Talk with your health care provider to find out if you are at risk for hepatitis B infection. Hepatitis C Testing is recommended for:  Everyone born from 1945 through 1965.  Anyone with known risk factors for hepatitis C. Sexually transmitted infections (STIs)  Get screened for STIs, including gonorrhea and chlamydia, if: ? You are sexually active and are younger than 50 years of age. ? You are older than 50 years of age and your health care provider tells you that you are at risk for this type of infection. ? Your sexual activity has changed since you were last screened, and you are at increased risk for chlamydia or gonorrhea. Ask your health care provider   if you are at risk.  Ask your health care provider about whether you are at high risk for HIV. Your health care provider may recommend a prescription medicine to help prevent HIV infection. If you choose to take medicine to prevent HIV, you should first get tested for HIV. You should then be tested every 3 months for as long as you are taking the medicine. Pregnancy  If you are about to stop having your period (premenopausal) and  you may become pregnant, seek counseling before you get pregnant.  Take 400 to 800 micrograms (mcg) of folic acid every day if you become pregnant.  Ask for birth control (contraception) if you want to prevent pregnancy. Osteoporosis and menopause Osteoporosis is a disease in which the bones lose minerals and strength with aging. This can result in bone fractures. If you are 65 years old or older, or if you are at risk for osteoporosis and fractures, ask your health care provider if you should:  Be screened for bone loss.  Take a calcium or vitamin D supplement to lower your risk of fractures.  Be given hormone replacement therapy (HRT) to treat symptoms of menopause. Follow these instructions at home: Lifestyle  Do not use any products that contain nicotine or tobacco, such as cigarettes, e-cigarettes, and chewing tobacco. If you need help quitting, ask your health care provider.  Do not use street drugs.  Do not share needles.  Ask your health care provider for help if you need support or information about quitting drugs. Alcohol use  Do not drink alcohol if: ? Your health care provider tells you not to drink. ? You are pregnant, may be pregnant, or are planning to become pregnant.  If you drink alcohol: ? Limit how much you use to 0-1 drink a day. ? Limit intake if you are breastfeeding.  Be aware of how much alcohol is in your drink. In the [place] one drink equals one 12 oz bottle of beer (355 mL), one 5 oz glass of wine (148 mL), or one 1 oz glass of hard liquor (44 mL). General instructions  Schedule regular health, dental, and eye exams.  Stay current with your vaccines.  Tell your health care provider if: ? You often feel depressed. ? You have ever been abused or do not feel safe at home. Summary  Adopting a healthy lifestyle and getting preventive care are important in promoting health and wellness.  Follow your health care provider's instructions about healthy  diet, exercising, and getting tested or screened for diseases.  Follow your health care provider's instructions on monitoring your cholesterol and blood pressure. This information is not intended to replace advice given to you by your health care provider. Make sure you discuss any questions you have with your health care provider. Document Revised: 05/08/2018 Document Reviewed: 05/08/2018 Elsevier Patient Education  2021 Elsevier Inc.  

## 2020-11-03 NOTE — Assessment & Plan Note (Signed)
Well-controlled hypertension.  Continue amlodipine 10 mg daily. 

## 2020-11-03 NOTE — Assessment & Plan Note (Signed)
Smoking less.  2 cigarettes/day.  Cardiovascular and cancer risks associated with smoking discussed. Smoking cessation advice given.

## 2020-11-05 ENCOUNTER — Encounter: Payer: Self-pay | Admitting: Physical Medicine & Rehabilitation

## 2020-11-05 ENCOUNTER — Encounter: Payer: 59 | Attending: Physical Medicine & Rehabilitation | Admitting: Physical Medicine & Rehabilitation

## 2020-11-05 ENCOUNTER — Other Ambulatory Visit: Payer: Self-pay

## 2020-11-05 VITALS — BP 135/84 | HR 75 | Temp 98.7°F | Ht 67.0 in | Wt 163.0 lb

## 2020-11-05 DIAGNOSIS — M1711 Unilateral primary osteoarthritis, right knee: Secondary | ICD-10-CM | POA: Diagnosis not present

## 2020-11-05 NOTE — Patient Instructions (Signed)
Please call [facility] to about the knee brace bring them the prescription

## 2020-11-05 NOTE — Progress Notes (Signed)
Subjective:    Patient ID: [name], female    DOB: 1970/07/31, 50 y.o.   MRN: [id number]  HPI 50 year old female with a greater than 2-year history of chronic right knee pain.  She has seen her primary care provider, referred to sports medicine who performed performed corticosteroid injection. Failed short acting corticosteroid 09/12/2018 Dr Terrilee Files Sports medicine  Dr. Katrinka Blazing from sports medicine ordered MRI of the right knee which was performed on 03/29/2019 which demonstrated mild tricompartmental chondrosis. The patient was not getting much better and was referred to orthopedics.  There was some question whether the pain may have been coming from either the hip or the back given the lack of response to knee injection  The patient was referred to orthopedics and was seen by sports medicine who performed ultrasound-guided right hip intra-articular injection which did not provide any benefit. Voltaren gel not helpful, patient also tried Sportscreme.  She was placed on hydrocodone which was helpful but orthopedics did not wish to continue prescribing on a long-term basis. The patient was then referred to physical medicine rehab for evaluation. The patient underwent ultrasound-guided right knee Zilretta injection 5 mL, 32 mg of triamcinolone on 04/06/2020 with very good results.  This medication lasted for 3 months, patient presented back to the office on 07/23/2020 and was given home exercise program which the patient followed but did not provide any improvement in her knee pain symptoms.  She then underwent repeat ultra sound guided right knee Zilretta injection 5 mL, 32 mg of triamcinolone on 08/06/2020 Pain Inventory Average Pain 10 Pain Right Now 10 My pain is constant, dull, aching, and throbbing  In the last 24 hours, has pain interfered with the following? General activity 9 Relation with others 0 Enjoyment of life 9 What TIME of day is your pain at its worst? morning ,  daytime, evening, and night Sleep (in general) Poor  Pain is worse with: walking, bending, and standing Pain improves with: rest Relief from Meds: 0  Family History  Problem Relation Age of Onset   Hypertension Mother    Healthy Father    Social History   Socioeconomic History   Marital status: Single    Spouse name: Not on file   Number of children: Not on file   Years of education: Not on file   Highest education level: Not on file  Occupational History   Not on file  Tobacco Use   Smoking status: Every Day    Packs/day: 0.10    Pack years: 0.00    Types: Cigarettes   Smokeless tobacco: Never   Tobacco comments:    pack lasts 3 weeks  Vaping Use   Vaping Use: Never used  Substance and Sexual Activity   Alcohol use: Yes    Comment: occasional   Drug use: No   Sexual activity: Not Currently    Birth control/protection: Surgical  Other Topics Concern   Not on file  Social History Narrative   Not on file   Social Determinants of Health   Financial Resource Strain: Not on file  Food Insecurity: Not on file  Transportation Needs: Not on file  Physical Activity: Not on file  Stress: Not on file  Social Connections: Not on file   Past Surgical History:  Procedure Laterality Date   ABDOMINAL HYSTERECTOMY  2008   Past Surgical History:  Procedure Laterality Date   ABDOMINAL HYSTERECTOMY  2008   Past Medical History:  Diagnosis Date  Gout    Hypertension    Migraines    BP 135/84   Pulse 75   Temp 98.7 F (37.1 C)   Ht 5\' 7"  (1.702 m)   Wt 163 lb (73.9 kg)   SpO2 98%   BMI 25.53 kg/m   Opioid Risk Score:   Fall Risk Score:  `1  Depression screen PHQ 2/9  Depression screen [facility] 2/9 11/03/2020 08/06/2020 07/23/2020 03/19/2020 05/14/2018  Decreased Interest 3 0 0 0 0  Down, Depressed, Hopeless 0 0 0 0 1  PHQ - 2 Score 3 0 0 0 1  Altered sleeping 3 - - 0 -  Tired, decreased energy 3 - - 0 -  Change in appetite 0 - - 0 -  Feeling bad or failure  about yourself  0 - - 0 -  Trouble concentrating 0 - - 0 -  Moving slowly or fidgety/restless 0 - - 0 -  Suicidal thoughts 0 - - 0 -  PHQ-9 Score 9 - - 0 -    Review of Systems  Constitutional: Negative.   HENT: Negative.    Eyes: Negative.   Respiratory: Negative.    Cardiovascular: Negative.   Gastrointestinal: Negative.   Endocrine: Negative.   Genitourinary: Negative.   Musculoskeletal:  Positive for arthralgias.  Skin: Negative.   Allergic/Immunologic: Negative.   Neurological: Negative.   Hematological: Negative.   Psychiatric/Behavioral: Negative.    All other systems reviewed and are negative.     Objective:   Physical Exam Vitals reviewed.  Constitutional:      Appearance: She is normal weight.  Eyes:     Extraocular Movements: Extraocular movements intact.     Conjunctiva/sclera: Conjunctivae normal.     Pupils: Pupils are equal, round, and reactive to light.  Musculoskeletal:     Comments: No evidence of right knee effusion she has full range of motion right knee there is medial greater than lateral joint line tenderness.  No pain over the patella.  No evidence of ecchymosis.  No erythema   Skin:    General: Skin is warm and dry.  Neurological:     Mental Status: She is alert and oriented to person, place, and time.  Psychiatric:        Mood and Affect: Mood normal.        Behavior: Behavior normal.          Assessment & Plan:  1.  Tricompartmental chondrosis/osteoarthritis right knee with chronic knee pain that interferes with activity such as work.  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 Would recommend repeat Zilretta injection 5 cc, 32 mg of triamcinolone under ultrasound guidance to the right knee.  She has failed conservative care including home exercise program as well as short acting corticosteroid medication injections. We will schedule this at first available.  This injection helped relieve the pain  significantly and improve activity level on 2 occasions.  Hoping to avoid chronic narcotic analgesic usage

## 2020-11-16 ENCOUNTER — Telehealth: Payer: Self-pay | Admitting: *Deleted

## 2020-11-16 MED ORDER — TRAMADOL HCL 50 MG PO TABS: 50.0000 mg | ORAL_TABLET | Freq: Four times a day (QID) | ORAL | 0 refills | Status: DC | PRN

## 2020-11-16 NOTE — Telephone Encounter (Signed)
Elan called and reports she is in a lot of pain. She states she could not get the zilretta shot in her knee at last appt and is not scheduled to get it until 12/16/20 . She will not be able to get her brace until 12/16/20 either.  She is asking if she can get something for pain until she can get the shot?

## 2020-11-16 NOTE — Telephone Encounter (Signed)
"  May call in tramadol 50mg  1 tab q 6 hr prn #30 no RF " Tramadol called to her pharmacy and Ms Bessire notified.

## 2020-12-11 ENCOUNTER — Other Ambulatory Visit: Payer: Self-pay | Admitting: Family

## 2020-12-16 ENCOUNTER — Encounter: Payer: Self-pay | Admitting: Physical Medicine & Rehabilitation

## 2020-12-16 ENCOUNTER — Encounter: Payer: 59 | Attending: Physical Medicine & Rehabilitation | Admitting: Physical Medicine & Rehabilitation

## 2020-12-16 ENCOUNTER — Other Ambulatory Visit: Payer: Self-pay

## 2020-12-16 VITALS — BP 137/96 | HR 85 | Temp 98.3°F | Ht 67.0 in | Wt 164.8 lb

## 2020-12-16 DIAGNOSIS — M1711 Unilateral primary osteoarthritis, right knee: Secondary | ICD-10-CM | POA: Diagnosis present

## 2020-12-16 NOTE — Patient Instructions (Signed)
Knee Injection ?A knee injection is a procedure to get medicine into your knee joint to relieve the pain, swelling, and stiffness of arthritis. Your health care provider uses a needle to inject medicine, which may also help to lubricate and cushion your knee joint. You may need more than one injection. ?Tell a health care provider about: ?Any allergies you have. ?All medicines you are taking, including vitamins, herbs, eye drops, creams, and over-the-counter medicines. ?Any problems you or family members have had with anesthetic medicines. ?Any blood disorders you have. ?Any surgeries you have had. ?Any medical conditions you have. ?Whether you are pregnant or may be pregnant. ?What are the risks? ?Generally, this is a safe procedure. However, problems may occur, including: ?Infection. ?Bleeding. ?Symptoms that get worse. ?Damage to the area around your knee. ?Allergic reaction to any of the medicines. ?Skin reactions from repeated injections. ?What happens before the procedure? ?Ask your health care provider about: ?Changing or stopping your regular medicines. This is especially important if you are taking diabetes medicines or blood thinners. ?Taking medicines such as aspirin and ibuprofen. These medicines can thin your blood. Do not take these medicines unless your health care provider tells you to take them. ?Taking over-the-counter medicines, vitamins, herbs, and supplements. ?Plan to have a responsible adult take you home from the hospital or clinic. ?What happens during the procedure? ? ?You will sit or lie down in a position for your knee to be treated. ?The skin over your kneecap will be cleaned with a germ-killing soap. ?You will be given a medicine that numbs the area (local anesthetic). You may feel some stinging. ?The medicine will be injected into your knee. The needle is carefully placed between your kneecap and your knee. The medicine is injected into the joint space. ?The needle will be removed at  the end of the procedure. ?A bandage (dressing) may be placed over the injection site. ?The procedure may vary among health care providers and hospitals. ?What can I expect after the procedure? ?Your blood pressure, heart rate, breathing rate, and blood oxygen level will be monitored until you leave the hospital or clinic. ?You may have to move your knee through its full range of motion. This helps to get all the medicine into your joint space. ?You will be watched to make sure that you do not have a reaction to the injected medicine. ?You may feel more pain, swelling, and warmth than you did before the injection. This reaction may last about 1-2 days. ?Follow these instructions at home: ?Medicines ?Take over-the-counter and prescription medicines only as told by your health care provider. ?Ask your health care provider if the medicine prescribed to you requires you to avoid driving or using machinery. ?Do not take medicines such as aspirin and ibuprofen unless your health care provider tells you to take them. ?Injection site care ?Follow instructions from your health care provider about: ?How to take care of your puncture site. ?When and how you should change your dressing. ?When you should remove your dressing. ?Check your injection area every day for signs of infection. Check for: ?More redness, swelling, or pain after 2 days. ?Fluid or blood. ?Pus or a bad smell. ?Warmth. ?Managing pain, stiffness, and swelling ? ?If directed, put ice on the injection area. To do this: ?Put ice in a plastic bag. ?Place a towel between your skin and the bag. ?Leave the ice on for 20 minutes, 2-3 times per day. ?Remove the ice if your skin turns bright red.   This is very important. If you cannot feel pain, heat, or cold, you have a greater risk of damage to the area. ?Do not apply heat to your knee. ?Raise (elevate) the injection area above the level of your heart while you are sitting or lying down. ?General instructions ?If you  were given a dressing, keep it dry until your health care provider says it can be removed. Ask your health care provider when you can start showering or bathing. ?Avoid strenuous activities for as long as directed by your health care provider. Ask your health care provider when you can return to your normal activities. ?Keep all follow-up visits. This is important. You may need more injections. ?Contact a health care provider if you have: ?A fever. ?Warmth in your injection area. ?Fluid, blood, or pus coming from your injection site. ?Symptoms at your injection site that last longer than 2 days after your procedure. ?Get help right away if: ?Your knee turns very red. ?Your knee becomes very swollen. ?Your knee is in severe pain. ?Summary ?A knee injection is a procedure to get medicine into your knee joint to relieve the pain, swelling, and stiffness of arthritis. ?A needle is carefully placed between your kneecap and your knee to inject medicine into the joint space. ?Before the procedure, ask your health care provider about changing or stopping your regular medicines, especially if you are taking diabetes medicines or blood thinners. ?Contact your health care provider if you have any problems or questions after your procedure. ?This information is not intended to replace advice given to you by your health care provider. Make sure you discuss any questions you have with your health care provider. ?Document Revised: 10/29/2019 Document Reviewed: 10/29/2019 ?Elsevier Patient Education ? 2022 Elsevier Inc. ? ?

## 2020-12-16 NOTE — Progress Notes (Signed)
RIGHT Knee injection  ultrasound guidance)  Indication:RIGHT Knee pain not relieved by medication management and other conservative care.  Informed consent was obtained after describing risks and benefits of the procedure with the patient, this includes bleeding, bruising, infection and medication side effects. The patient wishes to proceed and has given written consent. The patient was placed in a recumbent position. The medial aspect of the knee was marked and prepped with Betadine and alcohol. It was then entered with a 25-gauge 1-1/2 inch needle and 1 mL of 1% lidocaine was injected into the skin and subcutaneous tissue. Then another 21g 2"needle was inserted into the knee joint. After negative draw back for blood, a solution containing ofZilretta (32mg  Triamcinolone) injected. The patient tolerated the procedure well. Post procedure instructions were given.

## 2021-03-18 ENCOUNTER — Other Ambulatory Visit: Payer: Self-pay

## 2021-03-18 ENCOUNTER — Encounter: Payer: 59 | Attending: Physical Medicine & Rehabilitation | Admitting: Physical Medicine & Rehabilitation

## 2021-03-18 ENCOUNTER — Encounter: Payer: Self-pay | Admitting: Physical Medicine & Rehabilitation

## 2021-03-18 VITALS — BP 121/80 | HR 62 | Temp 98.5°F | Ht 67.0 in | Wt 166.0 lb

## 2021-03-18 DIAGNOSIS — M1711 Unilateral primary osteoarthritis, right knee: Secondary | ICD-10-CM

## 2021-03-18 NOTE — Progress Notes (Signed)
RIGHT Knee injection  ultrasound guidance)  Indication:RIGHT Knee pain not relieved by medication management and other conservative care.  Informed consent was obtained after describing risks and benefits of the procedure with the patient, this includes bleeding, bruising, infection and medication side effects. The patient wishes to proceed and has given written consent. The patient was placed in a recumbent position. The medial aspect of the knee was marked and prepped with Betadine and alcohol. It was then entered with a 25-gauge 1-1/2 inch needle and 1 mL of 1% lidocaine was injected into the skin and subcutaneous tissue. Then another 21g 2"needle was inserted into the knee joint. After negative draw back for blood, a solution containing 5ML ofZilretta (32mg Triamcinolone) injected. The patient tolerated the procedure well. Post procedure instructions were given.    

## 2021-03-18 NOTE — Patient Instructions (Signed)
Knee Injection ?A knee injection is a procedure to get medicine into your knee joint to relieve the pain, swelling, and stiffness of arthritis. Your health care provider uses a needle to inject medicine, which may also help to lubricate and cushion your knee joint. You may need more than one injection. ?Tell a health care provider about: ?Any allergies you have. ?All medicines you are taking, including vitamins, herbs, eye drops, creams, and over-the-counter medicines. ?Any problems you or family members have had with anesthetic medicines. ?Any blood disorders you have. ?Any surgeries you have had. ?Any medical conditions you have. ?Whether you are pregnant or may be pregnant. ?What are the risks? ?Generally, this is a safe procedure. However, problems may occur, including: ?Infection. ?Bleeding. ?Symptoms that get worse. ?Damage to the area around your knee. ?Allergic reaction to any of the medicines. ?Skin reactions from repeated injections. ?What happens before the procedure? ?Ask your health care provider about: ?Changing or stopping your regular medicines. This is especially important if you are taking diabetes medicines or blood thinners. ?Taking medicines such as aspirin and ibuprofen. These medicines can thin your blood. Do not take these medicines unless your health care provider tells you to take them. ?Taking over-the-counter medicines, vitamins, herbs, and supplements. ?Plan to have a responsible adult take you home from the hospital or clinic. ?What happens during the procedure? ? ?You will sit or lie down in a position for your knee to be treated. ?The skin over your kneecap will be cleaned with a germ-killing soap. ?You will be given a medicine that numbs the area (local anesthetic). You may feel some stinging. ?The medicine will be injected into your knee. The needle is carefully placed between your kneecap and your knee. The medicine is injected into the joint space. ?The needle will be removed at  the end of the procedure. ?A bandage (dressing) may be placed over the injection site. ?The procedure may vary among health care providers and hospitals. ?What can I expect after the procedure? ?Your blood pressure, heart rate, breathing rate, and blood oxygen level will be monitored until you leave the hospital or clinic. ?You may have to move your knee through its full range of motion. This helps to get all the medicine into your joint space. ?You will be watched to make sure that you do not have a reaction to the injected medicine. ?You may feel more pain, swelling, and warmth than you did before the injection. This reaction may last about 1-2 days. ?Follow these instructions at home: ?Medicines ?Take over-the-counter and prescription medicines only as told by your health care provider. ?Ask your health care provider if the medicine prescribed to you requires you to avoid driving or using machinery. ?Do not take medicines such as aspirin and ibuprofen unless your health care provider tells you to take them. ?Injection site care ?Follow instructions from your health care provider about: ?How to take care of your puncture site. ?When and how you should change your dressing. ?When you should remove your dressing. ?Check your injection area every day for signs of infection. Check for: ?More redness, swelling, or pain after 2 days. ?Fluid or blood. ?Pus or a bad smell. ?Warmth. ?Managing pain, stiffness, and swelling ? ?If directed, put ice on the injection area. To do this: ?Put ice in a plastic bag. ?Place a towel between your skin and the bag. ?Leave the ice on for 20 minutes, 2-3 times per day. ?Remove the ice if your skin turns bright red.   This is very important. If you cannot feel pain, heat, or cold, you have a greater risk of damage to the area. ?Do not apply heat to your knee. ?Raise (elevate) the injection area above the level of your heart while you are sitting or lying down. ?General instructions ?If you  were given a dressing, keep it dry until your health care provider says it can be removed. Ask your health care provider when you can start showering or bathing. ?Avoid strenuous activities for as long as directed by your health care provider. Ask your health care provider when you can return to your normal activities. ?Keep all follow-up visits. This is important. You may need more injections. ?Contact a health care provider if you have: ?A fever. ?Warmth in your injection area. ?Fluid, blood, or pus coming from your injection site. ?Symptoms at your injection site that last longer than 2 days after your procedure. ?Get help right away if: ?Your knee turns very red. ?Your knee becomes very swollen. ?Your knee is in severe pain. ?Summary ?A knee injection is a procedure to get medicine into your knee joint to relieve the pain, swelling, and stiffness of arthritis. ?A needle is carefully placed between your kneecap and your knee to inject medicine into the joint space. ?Before the procedure, ask your health care provider about changing or stopping your regular medicines, especially if you are taking diabetes medicines or blood thinners. ?Contact your health care provider if you have any problems or questions after your procedure. ?This information is not intended to replace advice given to you by your health care provider. Make sure you discuss any questions you have with your health care provider. ?Document Revised: 10/29/2019 Document Reviewed: 10/29/2019 ?Elsevier Patient Education ? 2022 Elsevier Inc. ? ?

## 2021-03-24 ENCOUNTER — Other Ambulatory Visit: Payer: Self-pay | Admitting: Emergency Medicine

## 2021-04-08 ENCOUNTER — Ambulatory Visit: Payer: 59 | Admitting: Internal Medicine

## 2021-04-11 ENCOUNTER — Other Ambulatory Visit: Payer: Self-pay | Admitting: Emergency Medicine

## 2021-04-11 ENCOUNTER — Telehealth: Payer: Self-pay | Admitting: Emergency Medicine

## 2021-04-11 MED ORDER — LINACLOTIDE 145 MCG PO CAPS: 145.0000 ug | ORAL_CAPSULE | Freq: Every day | ORAL | 1 refills | Status: DC

## 2021-04-11 NOTE — Telephone Encounter (Signed)
Prescription for Linzess sent to pharmacy of record.  Thanks.

## 2021-04-11 NOTE — Telephone Encounter (Signed)
Patient calling in having trouble having a BM  Patient says she has not had a BM in 4 days & has tried several OTC meds but nothing is working  Wants to know if provider can send something to pharmacy to help  Callback [phone]  Pharmacy:  [id number] [place], [place] - [address] AT [facility]  Phone:  [phone] Fax:  [phone]

## 2021-04-12 NOTE — Telephone Encounter (Signed)
Called and spoke with pt, she states she received the medication.

## 2021-05-05 ENCOUNTER — Ambulatory Visit (INDEPENDENT_AMBULATORY_CARE_PROVIDER_SITE_OTHER): Payer: 59 | Admitting: Emergency Medicine

## 2021-05-05 ENCOUNTER — Encounter: Payer: Self-pay | Admitting: Emergency Medicine

## 2021-05-05 ENCOUNTER — Ambulatory Visit: Payer: 59 | Admitting: Emergency Medicine

## 2021-05-05 ENCOUNTER — Other Ambulatory Visit: Payer: Self-pay

## 2021-05-05 VITALS — BP 122/68 | HR 88 | Ht 67.0 in | Wt 171.0 lb

## 2021-05-05 DIAGNOSIS — F4310 Post-traumatic stress disorder, unspecified: Secondary | ICD-10-CM

## 2021-05-05 DIAGNOSIS — Z23 Encounter for immunization: Secondary | ICD-10-CM | POA: Diagnosis not present

## 2021-05-05 DIAGNOSIS — S39012A Strain of muscle, fascia and tendon of lower back, initial encounter: Secondary | ICD-10-CM | POA: Insufficient documentation

## 2021-05-05 DIAGNOSIS — I10 Essential (primary) hypertension: Secondary | ICD-10-CM | POA: Diagnosis not present

## 2021-05-05 DIAGNOSIS — F418 Other specified anxiety disorders: Secondary | ICD-10-CM | POA: Diagnosis not present

## 2021-05-05 MED ORDER — ESCITALOPRAM OXALATE 10 MG PO TABS: 10.0000 mg | ORAL_TABLET | Freq: Every day | ORAL | 1 refills | Status: DC

## 2021-05-05 MED ORDER — MELOXICAM 15 MG PO TABS: 15.0000 mg | ORAL_TABLET | Freq: Every day | ORAL | 0 refills | Status: DC

## 2021-05-05 MED ORDER — CYCLOBENZAPRINE HCL 10 MG PO TABS: 10.0000 mg | ORAL_TABLET | Freq: Every day | ORAL | 1 refills | Status: AC

## 2021-05-05 NOTE — Patient Instructions (Signed)
Managing Post-Traumatic Stress Disorder If you have been diagnosed with post-traumatic stress disorder (PTSD), you may be relieved that you now know why you have felt or behaved a certain way. Still, you may feel overwhelmed about the treatment ahead. You may also wonder how to get the support you need and how to deal with the condition day-to-day. If you are living with PTSD, there are ways to help you recover from it and manage your symptoms. How to manage lifestyle changes Managing stress Stress is your body's reaction to life changes and events, both good and bad. Stress can make PTSD worse. Take the following steps to manage stress: Talk with your health care provider or a counselor if you would like to learn more about techniques to reduce your stress. He or she may suggest some stress reduction techniques such as: Muscle relaxation exercises. Regular exercise. Meditation, yoga, or other mind-body exercises. Breathing exercises. Listening to quiet music. Spending time outside. Maintain a healthy lifestyle. Eat a healthy diet, exercise regularly, get plenty of sleep, and take time to relax. Spend time with others. Talk with them about how you are feeling and what kind of support you need. Try not to isolate yourself, even though you may feel like doing that. Isolating yourself can delay your recovery. Do activities and hobbies that you enjoy. Pace yourself when doing stressful things. Take breaks, and reward yourself when you finish. Make sure that you do not overload your schedule.  Medicines Your health care provider may suggest certain medicines if he or she feels that they will help to improve your condition. Medicines for depression (antidepressants) or severe loss of contact with reality (antipsychotics) may be used to treat PTSD. Avoid using alcohol and other substances that may prevent your medicines from working properly. It is also important to: Talk with your pharmacist or health  care provider about all medicines that you take, their possible side effects, and which medicines are safe to take together. Make it your goal to take part in all treatment decisions (shared decision-making). Ask about possible side effects of medicines that your health care provider recommends, and tell him or her how you feel about having those side effects. It is best if shared decision-making with your health care provider is part of your total treatment plan. If your health care provider prescribes a medicine, you may not notice the full benefits of it for 4-8 weeks. Most people who are treated for PTSD need to take medicine for at least 6-12 months before they feel better. If you are taking medicines as part of your treatment, do not stop taking medicines before you ask your health care provider if it is safe to stop. You may need to have the medicine slowly decreased (tapered) over time to lower the risk of harmful side effects. Relationships Many people who have PTSD have difficulty trusting others. Make an effort to: Take risks and develop trust with close friends and family members. Developing trust in others can help you feel safe and connect you with emotional support. Be open and honest about your feelings. Have fun and relax in safe spaces, such as with friends and family. Think about going to couples counseling, family education classes, or family therapy. Your loved ones may not always know how to be supportive. Therapy can be helpful for everyone. How to recognize changes in your condition Be aware of your symptoms and how often you have them. The following symptoms mean that you need to seek help   for your PTSD: You feel suspicious and angry. You have repeated flashbacks. You avoid going out or being with others. You have an increasing number of fights with close friends or family members, such as your spouse. You have thoughts about hurting yourself or others. You cannot get relief  from feelings of depression or anxiety. Follow these instructions at home: Lifestyle Exercise regularly. Try to do 30 or more minutes of physical activity on most days of the week. Try to get 7-9 hours of sleep each night. To help with sleep: Keep your bedroom cool and dark. Avoid screen time before bedtime. This means avoiding use of your TV, computer, tablet, and cell phone. Practice self-soothing skills and use them daily. Try to have fun and seek humor in your life. Eating and drinking Do not eat a heavy meal during the hour before you go to bed. Do not drink alcohol or caffeinated drinks before bed. Avoid using alcohol or drugs. General instructions If your PTSD is affecting your marriage or family, seek help from a family therapist. Remind yourself that recovering from the trauma is a process and takes time. Take over-the-counter and prescription medicines only as told by your health care provider. Make sure to let all of your health care providers know that you have PTSD. This is especially important if you are having surgery or need to be admitted to the hospital. Keep all follow-up visits as told by your health care providers. This is important. Where to find support Talking to others Explain that PTSD is a mental health problem. It is something that a person can develop after experiencing or seeing a life-threatening event. Tell them that PTSD makes you feel stress like you did during the event. Talk to your loved ones about the symptoms you have. Also tell them what things or situations can cause symptoms to start (are triggers for you). Assure your loved ones that there are treatments to help PTSD. Discuss possibly seeking family therapy or couples therapy. If you are worried or fearful about seeking treatment, ask for support. Keep daily contact with at least one trusted friend or family member. Finances Not all insurance plans cover mental health care, so it is important to  check with your insurance carrier. If paying for co-pays or counseling services is a problem, search for a local or county mental health care center. Public mental health care services may be offered there at a low cost or no cost when you are not able to see a private health care provider. If you are a veteran, contact a local veterans organization or veterans hospital for more information. If you are taking medicine for PTSD, you may be able to get the genericform, which may be less expensive than brand-name medicine. Some makers of prescription medicines also offer help to patients who cannot afford the medicines that they need. Therapy and support groups Find a support group in your community. Often, groups are available for military veterans, trauma victims, and family members or caregivers. Look into volunteer opportunities. Taking part in these can help you feel more connected to your community. Contact a local organization to find out if you are eligible for a service dog. Where to find more information Go to this website to find more information about PTSD, treatment of PTSD, and how to get support: National Center for PTSD: www.ptsd.va.gov Contact a health care provider if: Your symptoms get worse or do not get better. Get help right away if: You have thoughts about   hurting yourself or others. If you ever feel like you may hurt yourself or others, or have thoughts about taking your own life, get help right away. You can go to your nearest emergency department or call: Your local emergency services (911 in the [place] A suicide crisis helpline, such as the National Suicide Prevention Lifeline at 1-800-273-8255 or 988 in the [place] This is open 24-hours a day. Summary If you are living with PTSD, there are ways to help you recover from it and manage your symptoms. Find supportive environments and people who understand PTSD. Spend time in those places, and maintain contact with those  people. Work with your health care team to create a plan for managing PTSD. The plan should include counseling, stress reduction techniques, and healthy lifestyle habits. This information is not intended to replace advice given to you by your health care provider. Make sure you discuss any questions you have with your health care provider. Document Revised: 01/06/2021 Document Reviewed: 01/30/2020 Elsevier Patient Education  2022 Elsevier Inc.  
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
## 2021-05-05 NOTE — Assessment & Plan Note (Signed)
Well-controlled hypertension.  Continue amlodipine 10 mg daily. BP Readings from Last 3 Encounters:  05/05/21 122/68  03/18/21 121/80  12/16/20 (!) 137/96

## 2021-05-05 NOTE — Assessment & Plan Note (Signed)
Rest with a heat pad. May use Flexeril 10 mg at bedtime. Meloxicam 15 mg daily for 10 days.

## 2021-05-05 NOTE — Assessment & Plan Note (Signed)
Very impactful and stressful recent event.  Needs behavioral health intervention.  Referral placed today. May benefit from starting Lexapro 10 mg daily. Stress management techniques discussed with patient.

## 2021-05-13 ENCOUNTER — Ambulatory Visit: Payer: 59 | Admitting: Internal Medicine

## 2021-06-15 ENCOUNTER — Other Ambulatory Visit: Payer: Self-pay

## 2021-06-15 ENCOUNTER — Emergency Department (HOSPITAL_COMMUNITY)
Admission: EM | Admit: 2021-06-15 | Discharge: 2021-06-15 | Disposition: A | Discharge status: Home / Self Care | Payer: 59 | Attending: Emergency Medicine | Admitting: Emergency Medicine

## 2021-06-15 ENCOUNTER — Telehealth: Payer: Self-pay | Admitting: Emergency Medicine

## 2021-06-15 DIAGNOSIS — Z7901 Long term (current) use of anticoagulants: Secondary | ICD-10-CM | POA: Insufficient documentation

## 2021-06-15 DIAGNOSIS — M5432 Sciatica, left side: Secondary | ICD-10-CM | POA: Insufficient documentation

## 2021-06-15 DIAGNOSIS — Z79899 Other long term (current) drug therapy: Secondary | ICD-10-CM | POA: Diagnosis not present

## 2021-06-15 DIAGNOSIS — I10 Essential (primary) hypertension: Secondary | ICD-10-CM | POA: Diagnosis not present

## 2021-06-15 DIAGNOSIS — M545 Low back pain, unspecified: Secondary | ICD-10-CM | POA: Diagnosis present

## 2021-06-15 MED ORDER — OXYCODONE-ACETAMINOPHEN 5-325 MG PO TABS
1.0000 | ORAL_TABLET | Freq: Once | ORAL | Status: AC
  Administered 2021-06-15: 1 via ORAL
  Filled 2021-06-15: qty 1

## 2021-06-15 MED ORDER — DEXAMETHASONE SODIUM PHOSPHATE 10 MG/ML IJ SOLN
10.0000 mg | Freq: Once | INTRAMUSCULAR | Status: AC
  Administered 2021-06-15: 10 mg via INTRAMUSCULAR
  Filled 2021-06-15: qty 1

## 2021-06-15 MED ORDER — DIAZEPAM 5 MG PO TABS: 5.0000 mg | ORAL_TABLET | Freq: Three times a day (TID) | ORAL | 0 refills | Status: DC | PRN

## 2021-06-15 MED ORDER — KETOROLAC TROMETHAMINE 60 MG/2ML IM SOLN: 60.0000 mg | Freq: Once | INTRAMUSCULAR | Status: DC

## 2021-06-15 MED ORDER — KETOROLAC TROMETHAMINE 60 MG/2ML IM SOLN
30.0000 mg | Freq: Once | INTRAMUSCULAR | Status: AC
  Administered 2021-06-15: 30 mg via INTRAMUSCULAR
  Filled 2021-06-15: qty 2

## 2021-06-15 MED ORDER — DIAZEPAM 5 MG PO TABS
5.0000 mg | ORAL_TABLET | Freq: Once | ORAL | Status: AC
  Administered 2021-06-15: 5 mg via ORAL
  Filled 2021-06-15: qty 1

## 2021-06-15 NOTE — ED Provider Triage Note (Signed)
Emergency Medicine Provider Triage Evaluation Note  [name] , a 51 y.o. female  was evaluated in triage.  Pt complains of left sided sciatica.  Seen last week for same, started on muscle relaxants and NSAIDs without relief.  Pain worsened after cooking last night. Some tingling down left leg.  No bowel or bladder incontinence.  Review of Systems  Positive: Back pain Negative: Incontinence, fever  Physical Exam  BP 127/89    Pulse 92    Temp 98.3 F (36.8 C) (Oral)    Resp 16    SpO2 100%   Gen:   Awake, no distress   Resp:  Normal effort  MSK:   Moves extremities without difficulty  Other:  Remains ambulatory  Medical Decision Making  Medically screening exam initiated at 6:03 AM.  Appropriate orders placed.  Katherine Maywood Granholm was informed that the remainder of the evaluation will be completed by another provider, this initial triage assessment does not replace that evaluation, and the importance of remaining in the ED until their evaluation is complete.  Back pain.  No focal deficits in triage.   Larene Pickett, PA-C 06/15/21 [phone]

## 2021-06-15 NOTE — ED Notes (Signed)
Pt struggling to sit up dt hip pain. Pt states she can not put pressure on leg and when walking to walks on tip toe.

## 2021-06-15 NOTE — ED Triage Notes (Signed)
Pt reported to ED with c/o sciatic nerve pain to left leg that has been ongoing for 3 months but has worsened significantly over night. Pt states she has been evaluated by PCP and given meds that have not helped/

## 2021-06-15 NOTE — Discharge Instructions (Addendum)
Today your evaluated for a sciatica flare.  During your visit you received 2 doses of Percocet 5-325 mg, Valium 5 mg, Toradol 30 mg, Decadron 10 mg for pain and muscle spasm relief.    I recommend that you follow-up with sports medicine and have included the contact information for you.  I have also included a second practice in the event that it is going to be some time before you are able to be seen by our sports medicine clinicians.  For your pain, you can take 1000 mg of Tylenol with 600 mg of ibuprofen every 6 hours for pain.  You can also take Valium 5 mg every 6 hours as needed for muscle spasms.

## 2021-06-15 NOTE — ED Notes (Signed)
Provided pt with 3rd glass of water. Tolerating well

## 2021-06-15 NOTE — ED Provider Notes (Signed)
[facility] EMERGENCY DEPARTMENT Provider Note   CSN: [id number] Arrival date & time: 06/15/21  0522     History  Chief Complaint  Patient presents with   Leg Pain    [name] is a 51 y.o. female with past medical history of high blood pressure who presents with 1 week of severe left low back pain that has progressed over the last day to include weakness and severe pain down the left leg with new onset numbness and tingling as well as weakness radiating into the L foot.  Patient states that she has history of sciatica and with the acute pain episode over the last week she was seen by her PCP who prescribed Flexeril and meloxicam with have not helped alleviate her pain.  She is also tried [place] powder, ice and heat which did not help.  Her pain is so severe that she would rather "give birth again" and continue to experience pain.  She is no longer able to wear a belt or tight clothing secondary to her symptoms.  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  She denies recent exercise, recent trauma, increased work hours or duties.  Patient occasionally drinks wine coolers and smokes tobacco however denies drug use.    Home Medications Prior to Admission medications   Medication Sig Start Date End Date Taking? Authorizing Provider  diazepam (VALIUM) 5 MG tablet Take 1 tablet (5 mg total) by mouth every 8 (eight) hours as needed for muscle spasms. 06/15/21 06/15/22 Yes Champ Mungo, DO  acetaminophen-codeine (TYLENOL #3) 300-30 MG tablet Take 1 tablet by mouth every 8 (eight) hours as needed for moderate pain. 07/23/20   Erick Colace, MD  amLODipine (NORVASC) 10 MG tablet TAKE 1 TABLET(10 MG) BY MOUTH DAILY 03/24/21   Georgina Quint, MD  aspirin-acetaminophen-caffeine [facility] MIGRAINE) [phone] MG tablet Take 2 tablets by mouth every 6 (six) hours as needed for headache.     [provider]  escitalopram (LEXAPRO) 10 MG tablet Take 1 tablet (10 mg total) by mouth daily. 05/05/21 08/03/21  Georgina Quint, MD  EYSUVIS 0.25 % SUSP Instill 1 drop into both eyes as directed  1 drop 4 times day for 2 weeks then 1 drop 2 times day for 2 weeks 11/24/19   [provider]  linaclotide Karlene Einstein) 145 MCG CAPS capsule Take 1 capsule (145 mcg total) by mouth daily before breakfast. 04/11/21   Georgina Quint, MD  meloxicam (MOBIC) 15 MG tablet Take 1 tablet (15 mg total) by mouth daily. 05/05/21   Georgina Quint, MD  Olopatadine HCl 0.2 % SOLN Apply 1 drop to eye daily. 09/29/20   Mardella Layman, MD  RESTASIS 0.05 % ophthalmic emulsion 1 drop 2 (two) times daily. 11/24/19   [provider]  topiramate (TOPAMAX) 25 MG tablet Take 1 tablet (25 mg total) by mouth 2 (two) times daily. 11/03/20   Georgina Quint, MD      Allergies    Xanax [alprazolam]    Review of Systems   Review of Systems  Constitutional:  Negative for activity change.  Musculoskeletal:  Positive for back pain and myalgias.  Neurological:  Positive for weakness and numbness.   Physical Exam Updated Vital Signs BP 137/85 (BP Location: Right Arm)    Pulse 85    Temp 98 F (36.7 C) (Temporal)    Resp 16  Ht 5\' 7"  (1.702 m)    Wt 77.6 kg    SpO2 100%    BMI 26.78 kg/m  Constitutional: Well-appearing female in no acute distress. Cardio: Regular rate and rhythm.  No murmurs, rubs, gallops. Pulm: Normal work of breathing on room air. MSK: Left lower back tender to palpation with hypertonicity and increased fullness of the paraspinal muscles noted.  Tenderness endorsed distally to posterior aspect of mid-thigh.  Skin: Skin is warm and dry. Neuro: Mental Status: Patient is awake, alert, oriented x3 No signs of aphasia or neglect  Motor: Unequal effort throughout 2/2 pain. RLE strength 5/5. L hip flexion and extension and ankle flexion and extension 5/5, L knee flexion  and extension 3/5 . Sensory: Sensation is grossly intact bilateral UE and LE Plantars: Toes are downgoing bilaterally. Psych: Tearful mood and affect  ED Results / Procedures / Treatments   Labs (all labs ordered are listed, but only abnormal results are displayed) Labs Reviewed - No data to display  EKG None  Radiology No results found.  Procedures None  Medications Ordered in ED Medications  oxyCODONE-acetaminophen (PERCOCET/ROXICET) 5-325 MG per tablet 1 tablet (1 tablet Oral Given 06/15/21 0750)  ketorolac (TORADOL) injection 30 mg (30 mg Intramuscular Given 06/15/21 0941)  dexamethasone (DECADRON) injection 10 mg (10 mg Intramuscular Given 06/15/21 0940)  oxyCODONE-acetaminophen (PERCOCET/ROXICET) 5-325 MG per tablet 1 tablet (1 tablet Oral Given 06/15/21 0940)  diazepam (VALIUM) tablet 5 mg (5 mg Oral Given 06/15/21 0940)    ED Course/ Medical Decision Making/ A&P   [name] is a 51 y.o. F with past medical history of HTN, sciatica, scoliosis who presents with 1 week of sciatica pain with exacerbation starting one day ago. She has received cyclobenzaprine and meloxicam as an outpatient without relief of symptoms. Acute episode is concerning for new onset numbness and tingling that radiates down the L leg and foot causing the patient to almost fall. Differential includes sciatica flare, herniated disc, spinal muscle strain.  Fortunately given physical exam findings including normal neurological exam and medical history it is most likely that this is an acute flare of her sciatica.  Management this time is included 1 dose of Percocet 5-325 mg.  After speaking with the patient, will order an additional dose of Percocet 5-325 mg, Valium 5 mg, Toradol 30 mg, Decadron 10 mg for pain and muscle spasm relief.  Patient does have a ride home.  On reevalauation patient reports improvement of her pain and had fallen asleep. On discharge I will recommend that she follow-up with sports  medicine. For her pain, she can take 1000 mg of Tylenol with 600 mg of ibuprofen every 6 hours for pain.  She can also take Valium 5 mg every 6 hours as needed for muscle spasms which I will send to her pharmacy.  Final Clinical Impression(s) / ED Diagnoses Final diagnoses:  Sciatica of left side    Rx / DC Orders ED Discharge Orders          Ordered    diazepam (VALIUM) 5 MG tablet  Every 8 hours PRN        06/15/21 1016              06/17/21, DO 06/15/21 1018    06/17/21, MD 06/15/21 1537

## 2021-06-15 NOTE — Telephone Encounter (Signed)
Caller states cyclobenzaprine prescribed for left sciatica - states pain is getting worse - almost fell when walking to kitchen - med not helping - last dose last night - just took one with food now. States in bed now- lying on right side because left leg goes numb if she lays on her right side. Pain now 10/10. Next f/u appt March 9(?).  Advised to call EMS 911

## 2021-06-16 NOTE — Telephone Encounter (Signed)
Called and spoke with pt, she states that she went to the ED for her severe leg pain and was given medication to help. Also, she has a OV with sports medicine to f/u. Made OV to see Dr. Alvy Bimler 06/27/21.

## 2021-06-16 NOTE — Progress Notes (Signed)
Aleen Sells D.[name] [address] [place] [address] Phone: [phone]   Assessment and Plan:     1. Sciatica associated with disorder of lumbar spine 2. Chronic left-sided low back pain with left-sided sciatica 3. Greater trochanteric bursitis of left hip -Chronic with exacerbation, initial sports medicine visit - Likely sciatica with irritation of lumbar spine versus piriformis/gluteal musculature based on HPI, physical exam.  Patient's flare of right knee pain has likely caused her to favor her left leg while standing for long periods of time at work which I believe is led to her current symptoms - No improvement with meloxicam and Flexeril, so recommend discontinuing both medications - Start Celebrex 200 mg twice daily.  Instructed to not take other NSAIDs while using medication - Start tizanidine 4 mg nightly for muscle spasms and pain relief - Start HEP and physical therapy for piriformis , gluteal musculature, hip exercises - Recommend continuing to use well cushioned shoes as well as buying a antifatigue standing mat that could be used while at work   Pertinent previous records reviewed include ER visit 06/15/2021, x-ray pelvis 12/15/2019, x-ray lumbar spine 12/15/2019   Follow Up: 4 weeks for reevaluation.  Patient is adamant that she does not want surgery or injections.  If no improvement or worsening of symptoms, could consider MRI for further evaluation, however we will have exhausted most of my conservative therapy means   Subjective:   I, Moenique Parris, am serving as a Neurosurgeon for Doctor Richardean Sale  Chief Complaint: left sided low back pain   HPI:  06/17/2021 Patient is is 51 year old female complaining of left sided low back pain. Patient states that she has had 1 week of severe left low back pain that has progressed over the last day to include weakness and severe pain down the left leg with new onset numbness and  tingling as well as weakness radiating into the L foot.  PCP who prescribed Flexeril and meloxicam with have not helped alleviate her pain.  She is also tried [place] powder, ice and heat which did not help. No MOI No shots no surgery conservative treatment preferred  Relevant Historical Information: Hypertension, OA right knee  Additional pertinent review of systems negative.   Current Outpatient Medications:    celecoxib (CELEBREX) 100 MG capsule, Take 1 capsule (100 mg total) by mouth 2 (two) times daily., Disp: 60 capsule, Rfl: 0   tiZANidine (ZANAFLEX) 4 MG tablet, Take 1 tablet (4 mg total) by mouth at bedtime., Disp: 30 tablet, Rfl: 0   acetaminophen-codeine (TYLENOL #3) 300-30 MG tablet, Take 1 tablet by mouth every 8 (eight) hours as needed for moderate pain., Disp: 21 tablet, Rfl: 0   amLODipine (NORVASC) 10 MG tablet, TAKE 1 TABLET(10 MG) BY MOUTH DAILY, Disp: 90 tablet, Rfl: 0   aspirin-acetaminophen-caffeine (EXCEDRIN MIGRAINE) 250-250-65 MG tablet, Take 2 tablets by mouth every 6 (six) hours as needed for headache., Disp: , Rfl:    diazepam (VALIUM) 5 MG tablet, Take 1 tablet (5 mg total) by mouth every 8 (eight) hours as needed for muscle spasms., Disp: 12 tablet, Rfl: 0   escitalopram (LEXAPRO) 10 MG tablet, Take 1 tablet (10 mg total) by mouth daily., Disp: 90 tablet, Rfl: 1   EYSUVIS 0.25 % SUSP, Instill 1 drop into both eyes as directed  1 drop 4 times day for 2 weeks then 1 drop 2 times day for 2 weeks, Disp: , Rfl:  linaclotide (LINZESS) 145 MCG CAPS capsule, Take 1 capsule (145 mcg total) by mouth daily before breakfast., Disp: 30 capsule, Rfl: 1   meloxicam (MOBIC) 15 MG tablet, Take 1 tablet (15 mg total) by mouth daily., Disp: 30 tablet, Rfl: 0   Olopatadine HCl 0.2 % SOLN, Apply 1 drop to eye daily., Disp: 2.5 mL, Rfl: 0   RESTASIS 0.05 % ophthalmic emulsion, 1 drop 2 (two) times daily., Disp: , Rfl:    topiramate (TOPAMAX) 25 MG tablet, Take 1 tablet (25 mg total) by  mouth 2 (two) times daily., Disp: 180 tablet, Rfl: 3   Objective:     Vitals:   06/17/21 1015  BP: 132/80  Pulse: 90  SpO2: 99%  Weight: 169 lb (76.7 kg)  Height: 5\' 7"  (1.702 m)      Body mass index is 26.47 kg/m.    Physical Exam:    General: awake, alert, and oriented no acute distress, nontoxic Skin: no suspicious lesions or rashes Neuro:sensation intact distally with no dificits, normal muscle tone, no atrophy, strength 5/5 in all tested lower ext groups Psych: normal mood and affect, speech clear  Left hip: No deformity, swelling or wasting ROM Fexion 90, ext 20, IR 35, ER 35 TTP greater trochanter, gluteal musculature, lumbar spine, left SI joint NTTP over the hip flexors,   Negative log roll with FROM Positive FABER with posterior and lateral hip pain Positive FADIR with posterior lateral hip pain Positive piriformis test Positive trendelenberg Gait normal    Electronically signed by:  D.[facility] 10:54 AM 06/17/21

## 2021-06-17 ENCOUNTER — Ambulatory Visit (INDEPENDENT_AMBULATORY_CARE_PROVIDER_SITE_OTHER): Payer: 59 | Admitting: Sports Medicine

## 2021-06-17 ENCOUNTER — Other Ambulatory Visit: Payer: Self-pay

## 2021-06-17 VITALS — BP 132/80 | HR 90 | Ht 67.0 in | Wt 169.0 lb

## 2021-06-17 DIAGNOSIS — M5442 Lumbago with sciatica, left side: Secondary | ICD-10-CM | POA: Diagnosis not present

## 2021-06-17 DIAGNOSIS — G8929 Other chronic pain: Secondary | ICD-10-CM

## 2021-06-17 DIAGNOSIS — M7062 Trochanteric bursitis, left hip: Secondary | ICD-10-CM | POA: Diagnosis not present

## 2021-06-17 DIAGNOSIS — M5386 Other specified dorsopathies, lumbar region: Secondary | ICD-10-CM

## 2021-06-17 MED ORDER — TIZANIDINE HCL 4 MG PO TABS: 4.0000 mg | ORAL_TABLET | Freq: Every evening | ORAL | 0 refills | Status: DC

## 2021-06-17 MED ORDER — CELECOXIB 100 MG PO CAPS: 100.0000 mg | ORAL_CAPSULE | Freq: Two times a day (BID) | ORAL | 0 refills | Status: DC

## 2021-06-17 NOTE — Patient Instructions (Addendum)
Good to see you  Physical therapy referral  Celebrex 100 mg twice a day  Tizanidine 4 mg at bed time  Recommend buying an anti fatigue mat  Low back sciatica and piriformis HEP 4 week follow up

## 2021-06-27 ENCOUNTER — Ambulatory Visit: Payer: 59 | Admitting: Emergency Medicine

## 2021-06-27 ENCOUNTER — Telehealth: Payer: Self-pay | Admitting: Emergency Medicine

## 2021-06-27 ENCOUNTER — Other Ambulatory Visit: Payer: Self-pay

## 2021-06-27 ENCOUNTER — Encounter: Payer: Self-pay | Admitting: Emergency Medicine

## 2021-06-27 VITALS — BP 126/72 | HR 97 | Ht 67.0 in | Wt 174.0 lb

## 2021-06-27 DIAGNOSIS — M5386 Other specified dorsopathies, lumbar region: Secondary | ICD-10-CM | POA: Diagnosis not present

## 2021-06-27 DIAGNOSIS — M5432 Sciatica, left side: Secondary | ICD-10-CM | POA: Diagnosis not present

## 2021-06-27 NOTE — Telephone Encounter (Signed)
Pt requesting to change PCP from Dr. Alvy Bimler to female provider. PCP okay with it? Scheduled pt with NP.

## 2021-06-27 NOTE — Telephone Encounter (Signed)
Very OK with this. We are not a good fit. Thanks.

## 2021-06-27 NOTE — Progress Notes (Signed)
[name] 51 y.o.   Chief Complaint  Patient presents with   Hospitalization Follow-up    Back pain, pt would like to discuss pain medication.   Vaginal Discharge    Pt would like medication for vaginal    HISTORY OF PRESENT ILLNESS: This is a 51 y.o. female here for follow-up of chronic lumbar pain with left-sided sciatica. Last seen by me on 05/05/2021 for hypertension and sciatica.  Was prescribed Flexeril and meloxicam.  Patient states it did not help. Was seen in the emergency room on 06/15/2021 for the same.  Treated and released.  Refer to sports medicine Seen by sports medicine Dr. Jean Rosenthal on 06/17/2021.  Still having symptoms and not happy with pain management. States daughter gave her OxyContin.  Inquiring about this. Also complaining of chronic vaginal discharge. During our conversation patient became very upset and just wanted to leave.   Vaginal Discharge The patient's primary symptoms include vaginal discharge.    Prior to Admission medications   Medication Sig Start Date End Date Taking? Authorizing Provider  acetaminophen-codeine (TYLENOL #3) 300-30 MG tablet Take 1 tablet by mouth every 8 (eight) hours as needed for moderate pain. 07/23/20  Yes Kirsteins, Victorino Sparrow, MD  amLODipine (NORVASC) 10 MG tablet TAKE 1 TABLET(10 MG) BY MOUTH DAILY 03/24/21  Yes Itzabella Sorrels, Eilleen Kempf, MD  aspirin-acetaminophen-caffeine (EXCEDRIN MIGRAINE) [phone] MG tablet Take 2 tablets by mouth every 6 (six) hours as needed for headache.   Yes [provider]  celecoxib (CELEBREX) 100 MG capsule Take 1 capsule (100 mg total) by mouth 2 (two) times daily. 06/17/21  Yes Richardean Sale, DO  diazepam (VALIUM) 5 MG tablet Take 1 tablet (5 mg total) by mouth every 8 (eight) hours as needed for muscle spasms. 06/15/21 06/15/22 Yes Champ Mungo, DO  escitalopram (LEXAPRO) 10 MG tablet Take 1 tablet (10 mg total) by mouth daily. 05/05/21 08/03/21 Yes Cheyanne Lamison, Eilleen Kempf, MD  EYSUVIS  0.25 % SUSP Instill 1 drop into both eyes as directed  1 drop 4 times day for 2 weeks then 1 drop 2 times day for 2 weeks 11/24/19  Yes [provider]  linaclotide (LINZESS) 145 MCG CAPS capsule Take 1 capsule (145 mcg total) by mouth daily before breakfast. 04/11/21  Yes Jullianna Gabor, Eilleen Kempf, MD  meloxicam (MOBIC) 15 MG tablet Take 1 tablet (15 mg total) by mouth daily. 05/05/21  Yes Mitcheal Sweetin, Eilleen Kempf, MD  Olopatadine HCl 0.2 % SOLN Apply 1 drop to eye daily. 09/29/20  Yes Hagler, Arlys John, MD  RESTASIS 0.05 % ophthalmic emulsion 1 drop 2 (two) times daily. 11/24/19  Yes [provider]  tiZANidine (ZANAFLEX) 4 MG tablet Take 1 tablet (4 mg total) by mouth at bedtime. 06/17/21  Yes Richardean Sale, DO  topiramate (TOPAMAX) 25 MG tablet Take 1 tablet (25 mg total) by mouth 2 (two) times daily. 11/03/20  Yes Georgina Quint, MD    Allergies  Allergen Reactions   Xanax [Alprazolam] Itching    Patient Active Problem List   Diagnosis Date Noted   PTSD (post-traumatic stress disorder) 05/05/2021   Strain of lumbar region 05/05/2021   Situational anxiety 05/05/2021   Primary osteoarthritis of right knee 11/05/2020   Patellofemoral arthritis of right knee 06/21/2018   Cervical radiculopathy 06/21/2018   Essential hypertension 05/14/2018   Tobacco abuse 05/14/2018   Gout 05/14/2018    Past Medical History:  Diagnosis Date   Gout    Hypertension    Migraines  Past Surgical History:  Procedure Laterality Date   ABDOMINAL HYSTERECTOMY  2008    Social History   Socioeconomic History   Marital status: Single    Spouse name: Not on file   Number of children: Not on file   Years of education: Not on file   Highest education level: Not on file  Occupational History   Not on file  Tobacco Use   Smoking status: Every Day    Packs/day: 0.10    Types: Cigarettes   Smokeless tobacco: Never   Tobacco comments:    pack lasts 3 weeks  Vaping Use   Vaping Use:  Never used  Substance and Sexual Activity   Alcohol use: Not Currently    Comment: occasional   Drug use: No   Sexual activity: Not Currently    Birth control/protection: Surgical  Other Topics Concern   Not on file  Social History Narrative   Not on file   Social Determinants of Health   Financial Resource Strain: Not on file  Food Insecurity: Not on file  Transportation Needs: Not on file  Physical Activity: Not on file  Stress: Not on file  Social Connections: Not on file  Intimate Partner Violence: Not on file    Family History  Problem Relation Age of Onset   Hypertension Mother    Healthy Father      Review of Systems  Genitourinary:  Positive for vaginal discharge.  Today's Vitals   06/27/21 1532  BP: 126/72  Pulse: 97  SpO2: 97%  Weight: 174 lb (78.9 kg)  Height: 5\' 7"  (1.702 m)   Body mass index is 27.25 kg/m.   Physical Exam Vitals reviewed.  Constitutional:      Appearance: Normal appearance.  HENT:     Head: Normocephalic.  Eyes:     Extraocular Movements: Extraocular movements intact.  Cardiovascular:     Rate and Rhythm: Normal rate.  Pulmonary:     Effort: Pulmonary effort is normal.  Skin:    General: Skin is warm and dry.  Neurological:     Mental Status: She is alert and oriented to person, place, and time.  Psychiatric:     Comments: Upset and short tempered     ASSESSMENT & PLAN: Problem List Items Addressed This Visit   None Visit Diagnoses     Chronic sciatica of left side    -  Primary   Sciatica associated with disorder of lumbar spine          Patient became very upset during our conversation, got up, and left without finishing evaluation. Upset about pain management options. No discharge papers given.     , MD [facility]

## 2021-06-28 NOTE — Telephone Encounter (Signed)
Pt connected to [facility] 1.30.2023 after hours.   Caller was seen today, provider was very rude. Caller has a sciatic nerve on left side, med is not working. Caller states has heavy vaginal discharge. Provider states does not recall caller stating have discharge. Caller states she stands for 8 h a day at work, needs something stronger. Caller is rqst to speak to RN regarding sciatic pain.    Caller also wanted this submitted as a complaint against the office.  Advised to see HCP with 4 hours.

## 2021-06-29 NOTE — Telephone Encounter (Signed)
The truth is I was very tolerant of her rudeness. I agree with her wish to switch providers as we are definitely not a good fit. Thank you for the information.

## 2021-07-01 ENCOUNTER — Ambulatory Visit: Payer: 59 | Admitting: Nurse Practitioner

## 2021-07-01 ENCOUNTER — Other Ambulatory Visit: Payer: Self-pay

## 2021-07-01 ENCOUNTER — Encounter: Payer: Self-pay | Admitting: Nurse Practitioner

## 2021-07-01 ENCOUNTER — Telehealth: Payer: Self-pay | Admitting: Nurse Practitioner

## 2021-07-01 VITALS — BP 132/86 | HR 93 | Temp 98.2°F | Ht 67.0 in | Wt 174.0 lb

## 2021-07-01 DIAGNOSIS — Z131 Encounter for screening for diabetes mellitus: Secondary | ICD-10-CM

## 2021-07-01 DIAGNOSIS — Z13220 Encounter for screening for lipoid disorders: Secondary | ICD-10-CM

## 2021-07-01 DIAGNOSIS — E785 Hyperlipidemia, unspecified: Secondary | ICD-10-CM | POA: Diagnosis not present

## 2021-07-01 DIAGNOSIS — E663 Overweight: Secondary | ICD-10-CM | POA: Diagnosis not present

## 2021-07-01 DIAGNOSIS — M5432 Sciatica, left side: Secondary | ICD-10-CM | POA: Diagnosis not present

## 2021-07-01 DIAGNOSIS — Z1329 Encounter for screening for other suspected endocrine disorder: Secondary | ICD-10-CM | POA: Diagnosis not present

## 2021-07-01 DIAGNOSIS — Z139 Encounter for screening, unspecified: Secondary | ICD-10-CM

## 2021-07-01 DIAGNOSIS — B3731 Acute candidiasis of vulva and vagina: Secondary | ICD-10-CM | POA: Diagnosis not present

## 2021-07-01 LAB — COMPREHENSIVE METABOLIC PANEL
ALT: 22 U/L (ref 0–35)
AST: 20 U/L (ref 0–37)
Albumin: 4.3 g/dL (ref 3.5–5.2)
Alkaline Phosphatase: 65 U/L (ref 39–117)
BUN: 14 mg/dL (ref 6–23)
CO2: 25 mEq/L (ref 19–32)
Calcium: 9.2 mg/dL (ref 8.4–10.5)
Chloride: 109 mEq/L (ref 96–112)
Creatinine, Ser: 0.81 mg/dL (ref 0.40–1.20)
GFR: 84.76 mL/min (ref >60.00)
Glucose, Bld: 88 mg/dL (ref 70–99)
Potassium: 3.7 mEq/L (ref 3.5–5.1)
Sodium: 139 mEq/L (ref 135–145)
Total Bilirubin: 0.3 mg/dL (ref 0.2–1.2)
Total Protein: 7.5 g/dL (ref 6.0–8.3)

## 2021-07-01 LAB — CBC WITH DIFFERENTIAL/PLATELET
Basophils Absolute: 0.1 10*3/uL (ref 0.0–0.1)
Basophils Relative: 0.5 % (ref 0.0–3.0)
Eosinophils Absolute: 0.1 10*3/uL (ref 0.0–0.7)
Eosinophils Relative: 0.7 % (ref 0.0–5.0)
HCT: 37.8 % (ref 36.0–46.0)
Hemoglobin: 12.3 g/dL (ref 12.0–15.0)
Lymphocytes Relative: 29.5 % (ref 12.0–46.0)
Lymphs Abs: 3.3 10*3/uL (ref 0.7–4.0)
MCHC: 32.5 g/dL (ref 30.0–36.0)
MCV: 86.5 fl (ref 78.0–100.0)
Monocytes Absolute: 1 10*3/uL (ref 0.1–1.0)
Monocytes Relative: 8.7 % (ref 3.0–12.0)
Neutro Abs: 6.8 10*3/uL (ref 1.4–7.7)
Neutrophils Relative %: 60.6 % (ref 43.0–77.0)
Platelets: 416 10*3/uL — ABNORMAL HIGH (ref 150.0–400.0)
RBC: 4.37 Mil/uL (ref 3.87–5.11)
RDW: 13.4 % (ref 11.5–15.5)
WBC: 11.2 10*3/uL — ABNORMAL HIGH (ref 4.0–10.5)

## 2021-07-01 LAB — HEMOGLOBIN A1C: Hgb A1c MFr Bld: 5.8 % (ref 4.6–6.5)

## 2021-07-01 LAB — LIPID PANEL
Cholesterol: 227 mg/dL — ABNORMAL HIGH (ref 0–200)
HDL: 54 mg/dL (ref >39.00)
LDL Cholesterol: 151 mg/dL — ABNORMAL HIGH (ref 0–99)
NonHDL: 173.22
Total CHOL/HDL Ratio: 4
Triglycerides: 110 mg/dL (ref 0.0–149.0)
VLDL: 22 mg/dL (ref 0.0–40.0)

## 2021-07-01 LAB — TSH: TSH: 0.71 u[IU]/mL (ref 0.35–5.50)

## 2021-07-01 MED ORDER — FLUCONAZOLE 150 MG PO TABS
ORAL_TABLET | ORAL | 0 refills | Status: DC
Start: 2021-07-01 — End: 2021-09-15

## 2021-07-01 NOTE — Progress Notes (Signed)
Subjective:  Patient ID: [name], female    DOB: [birth year]  Age: 51 y.o. MRN: [id number]  CC:  Chief Complaint  Patient presents with   Vaginal Discharge    Ongoing for 2.5 weeks, OTC douche tried, no sexual Sx conerns      HPI  This patient arrives today for the above.  Vaginal discharge: Started about 2-1/2 weeks ago.  She tells me it is white.  She does notice a foul odor as well but denies itching.  She has been douching.  She denies any possible risk of recent sexually transmitted infection exposure and denies any new sexual partners.  Left-sided sciatica: This is been going on for approximately 10 months.  She is being evaluated by sports medicine.  She was referred to physical therapy but has been unable to contact them regarding get an appointment scheduled.  She has taken NSAIDs, muscle relaxers, and steroids without pain relief.  She would like stronger medication such as opioid therapy if possible.  She tells me the pain makes it difficult for her to complete her tasks at work as she cannot stand for more than 60-120 minutes at a time.  Once her symptoms of pain start even sitting does not relieve the pain.  The only thing that will help is laying down.  She wants to continue working but is wondering if she can have a note to express her need to not stand for prolonged periods of time.  Past Medical History:  Diagnosis Date   Gout    Hypertension    Migraines       Family History  Problem Relation Age of Onset   Hypertension Mother    Healthy Father     Social History   Social History Narrative   Not on file   Social History   Tobacco Use   Smoking status: Every Day    Packs/day: 0.10    Types: Cigarettes   Smokeless tobacco: Never   Tobacco comments:    pack lasts 3 weeks  Substance Use Topics   Alcohol use: Not Currently    Comment: occasional     Current Meds  Medication Sig   amLODipine (NORVASC) 10 MG tablet TAKE 1 TABLET(10  MG) BY MOUTH DAILY   aspirin-acetaminophen-caffeine (EXCEDRIN MIGRAINE) 250-250-65 MG tablet Take 2 tablets by mouth every 6 (six) hours as needed for headache.   celecoxib (CELEBREX) 100 MG capsule Take 1 capsule (100 mg total) by mouth 2 (two) times daily.   diazepam (VALIUM) 5 MG tablet Take 1 tablet (5 mg total) by mouth every 8 (eight) hours as needed for muscle spasms.   escitalopram (LEXAPRO) 10 MG tablet Take 1 tablet (10 mg total) by mouth daily.   EYSUVIS 0.25 % SUSP Instill 1 drop into both eyes as directed  1 drop 4 times day for 2 weeks then 1 drop 2 times day for 2 weeks   fluconazole (DIFLUCAN) 150 MG tablet Take 1 tablet by mouth once, if symptoms persist for 72 hours you may take an additional dose.   linaclotide (LINZESS) 145 MCG CAPS capsule Take 1 capsule (145 mcg total) by mouth daily before breakfast.   meloxicam (MOBIC) 15 MG tablet Take 1 tablet (15 mg total) by mouth daily.   Olopatadine HCl 0.2 % SOLN Apply 1 drop to eye daily.   RESTASIS 0.05 % ophthalmic emulsion 1 drop 2 (two) times daily.   tiZANidine (ZANAFLEX) 4 MG tablet Take 1 tablet (  4 mg total) by mouth at bedtime.   topiramate (TOPAMAX) 25 MG tablet Take 1 tablet (25 mg total) by mouth 2 (two) times daily.   [DISCONTINUED] acetaminophen-codeine (TYLENOL #3) 300-30 MG tablet Take 1 tablet by mouth every 8 (eight) hours as needed for moderate pain.    ROS:  Review of Systems  Constitutional:  Negative for fever.  Eyes:  Negative for blurred vision and double vision.  Respiratory:  Negative for shortness of breath.   Cardiovascular:  Negative for chest pain.  Musculoskeletal:  Positive for back pain and joint pain.  Neurological:  Positive for tingling and weakness.    Objective:   Today's Vitals: BP 132/86 (BP Location: Right Arm, Patient Position: Sitting, Cuff Size: Large)    Pulse 93    Temp 98.2 F (36.8 C) (Oral)    Ht 5\' 7"  (1.702 m)    Wt 174 lb (78.9 kg)    SpO2 99%    BMI 27.25 kg/m  Vitals  with BMI 07/01/2021 06/27/2021 06/17/2021  Height 5\' 7"  5\' 7"  5\' 7"   Weight 174 lbs 174 lbs 169 lbs  BMI 27.25 27.25 26.46  Systolic 132 126 06/19/2021  Diastolic 86 72 80  Pulse 93 97 90     Physical Exam Vitals reviewed.  Constitutional:      General: She is not in acute distress.    Appearance: Normal appearance.  HENT:     Head: Normocephalic and atraumatic.  Neck:     Vascular: No carotid bruit.  Cardiovascular:     Rate and Rhythm: Normal rate and regular rhythm.     Pulses: Normal pulses.     Heart sounds: Normal heart sounds.  Pulmonary:     Effort: Pulmonary effort is normal.     Breath sounds: Normal breath sounds.  Skin:    General: Skin is warm and dry.  Neurological:     General: No focal deficit present.     Mental Status: She is alert and oriented to person, place, and time.     Gait: Gait abnormal (antalgic).  Psychiatric:        Mood and Affect: Mood normal.        Behavior: Behavior normal.        Judgment: Judgment normal.         Assessment and Plan   1. Sciatica of left side   2. Vaginal yeast infection   3. Overweight   4. Screening for diabetes mellitus   5. Screening for condition   6. Thyroid disorder screen   7. Screening cholesterol level   8. Hyperlipidemia, unspecified hyperlipidemia type      Plan: 1.  I told her that I do not start patients on chronic opioid therapy for pain routinely.  I will refer her to pain clinic for this.  We did discuss that if for some reason she is dismissed from the pain clinic I will most likely not be willing to take over any chronic opioid prescriptions.  She tells me she understands and would like referral.  Referral ordered today.  We will also refer her to physical therapy. 2.  Sound like this could be vaginal yeast infection we will trial with Diflucan if symptoms persist may consider treating for bacterial vaginosis versus also testing for sexually transmitted infections. 3.-8.  We will check blood work  today for further evaluation.   Of note patient is requesting to transfer her primary care needs to myself.  We will reach out  to her current primary care provider to see if he is agreeable to this.  Tests ordered Orders Placed This Encounter  Procedures   TSH   Hemoglobin A1c   Comprehensive metabolic panel   CBC with Differential/Platelet   Lipid Profile   Ambulatory referral to Physical Therapy   Ambulatory referral to Pain Clinic      Meds ordered this encounter  Medications   fluconazole (DIFLUCAN) 150 MG tablet    Sig: Take 1 tablet by mouth once, if symptoms persist for 72 hours you may take an additional dose.    Dispense:  1 tablet    Refill:  0    Order Specific Question:   Supervising Provider    Answer:   Pincus Sanes [id number]    Patient to follow-up in 1 to 3 months for annual physical exam, or sooner as needed.  Elenore Paddy, NP

## 2021-07-01 NOTE — Telephone Encounter (Signed)
Patient requesting to do TOC to myself. Wanted to make sure you are okay with this.

## 2021-07-02 NOTE — Telephone Encounter (Signed)
OK with this. Thanks.

## 2021-07-05 ENCOUNTER — Other Ambulatory Visit: Payer: Self-pay | Admitting: Emergency Medicine

## 2021-07-05 DIAGNOSIS — S39012A Strain of muscle, fascia and tendon of lower back, initial encounter: Secondary | ICD-10-CM

## 2021-07-14 NOTE — Progress Notes (Signed)
[name] [address] [place] [address] Phone: [phone]   Assessment and Plan:     1. Chronic left-sided low back pain with left-sided sciatica 2. Greater trochanteric bursitis of left hip -Chronic with exacerbation, subsequent visit - Continued left hip pain with radicular symptoms most consistent with greater trochanteric bursitis and sciatica with irritation at piriformis and gluteal musculature - Patient elected for CSI.  Tolerated well per note below - Recommended: Continue HEP  Start PT Continue Tylenol [phone] mg 2-3 times a day for pain relief  Get soft inserts for shoes use them at all times   Procedure: Greater trochanteric bursal injection Side: Left  Risks explained and consent was given verbally. The site was cleaned with alcohol prep. A steroid injection was performed with patient in the lateral side-lying position at area of maximum tenderness over greater trochanter using 48mL of 1% lidocaine without epinephrine and 56mL of kenalog 40mg /ml. This was well tolerated and resulted in symptomatic relief.  Needle was removed, hemostasis achieved, and post injection instructions were explained.  Pt was advised to call or return to clinic if these symptoms worsen or fail to improve as anticipated.   Pertinent previous records reviewed include none   Follow Up: 3 to 4 weeks for reevaluation.  Could consider advanced imaging of hip versus lumbar spine based on symptoms   Subjective:   I, Moenique Parris, am serving as a for Doctor Neurosurgeon  Chief Complaint: left sided low back pain   HPI:  06/17/2021 Patient is is 51 year old female complaining of left sided low back pain. Patient states that she has had 1 week of severe left low back pain that has progressed over the last day to include weakness and severe pain down the left leg with new onset numbness and tingling as well as weakness radiating  into the L foot.  PCP who prescribed Flexeril and meloxicam with have not helped alleviate her pain.  She is also tried 44 powder, ice and heat which did not help. No MOI No shots no surgery conservative treatment preferred   07/15/2021 Patient states that she would like a referral to [facility] , pain in the inguinal area that wraps from the low back pain when she stands and puts pressure on it, received a doctors note from dr gray so she can sit and stand , HEP has been doing them causes pain sometimes, Monday night was the worst pain, would like to discuss MRI    Relevant Historical Information: Hypertension, OA right knee  Relevant Historical Information: Hypertension  Additional pertinent review of systems negative.   Current Outpatient Medications:    amLODipine (NORVASC) 10 MG tablet, TAKE 1 TABLET(10 MG) BY MOUTH DAILY, Disp: 90 tablet, Rfl: 0   aspirin-acetaminophen-caffeine (EXCEDRIN MIGRAINE) 250-250-65 MG tablet, Take 2 tablets by mouth every 6 (six) hours as needed for headache., Disp: , Rfl:    celecoxib (CELEBREX) 100 MG capsule, Take 1 capsule (100 mg total) by mouth 2 (two) times daily., Disp: 60 capsule, Rfl: 0   diazepam (VALIUM) 5 MG tablet, Take 1 tablet (5 mg total) by mouth every 8 (eight) hours as needed for muscle spasms., Disp: 12 tablet, Rfl: 0   escitalopram (LEXAPRO) 10 MG tablet, Take 1 tablet (10 mg total) by mouth daily., Disp: 90 tablet, Rfl: 1   EYSUVIS 0.25 % SUSP, Instill 1 drop into both eyes as directed  1 drop  4 times day for 2 weeks then 1 drop 2 times day for 2 weeks, Disp: , Rfl:    fluconazole (DIFLUCAN) 150 MG tablet, Take 1 tablet by mouth once, if symptoms persist for 72 hours you may take an additional dose., Disp: 1 tablet, Rfl: 0   linaclotide (LINZESS) 145 MCG CAPS capsule, Take 1 capsule (145 mcg total) by mouth daily before breakfast., Disp: 30 capsule, Rfl: 1   meloxicam (MOBIC) 15 MG tablet, Take 1 tablet (15 mg total) by mouth  daily., Disp: 30 tablet, Rfl: 0   Olopatadine HCl 0.2 % SOLN, Apply 1 drop to eye daily., Disp: 2.5 mL, Rfl: 0   RESTASIS 0.05 % ophthalmic emulsion, 1 drop 2 (two) times daily., Disp: , Rfl:    tiZANidine (ZANAFLEX) 4 MG tablet, Take 1 tablet (4 mg total) by mouth at bedtime., Disp: 30 tablet, Rfl: 0   topiramate (TOPAMAX) 25 MG tablet, Take 1 tablet (25 mg total) by mouth 2 (two) times daily., Disp: 180 tablet, Rfl: 3   Objective:     Vitals:   07/15/21 0936  BP: 130/80  Pulse: 86  SpO2: 98%  Weight: 170 lb (77.1 kg)  Height: 5\' 7"  (1.702 m)      Body mass index is 26.63 kg/m.    Physical Exam:    General: awake, alert, and oriented no acute distress, nontoxic Skin: no suspicious lesions or rashes Neuro:sensation intact distally with no dificits, normal muscle tone, no atrophy, strength 5/5 in all tested lower ext groups Psych: normal mood and affect, speech clear  Left hip: No deformity, swelling or wasting ROM Fexion 90, ext 30, IR 30, ER 45 TTP gluteal musculature, greater trochanter NTTP over the hip flexors, g  si joint, lumbar spine Negative log roll with FROM Negative FABER Positive FADIR with lateral hip pain Positive piriformis test Positive trendelenberg Gait normal    Electronically signed by:  [name] 11:18 AM 07/15/21

## 2021-07-15 ENCOUNTER — Ambulatory Visit (INDEPENDENT_AMBULATORY_CARE_PROVIDER_SITE_OTHER): Payer: 59 | Admitting: Sports Medicine

## 2021-07-15 ENCOUNTER — Other Ambulatory Visit: Payer: Self-pay

## 2021-07-15 VITALS — BP 130/80 | HR 86 | Ht 67.0 in | Wt 170.0 lb

## 2021-07-15 DIAGNOSIS — M7062 Trochanteric bursitis, left hip: Secondary | ICD-10-CM

## 2021-07-15 DIAGNOSIS — G8929 Other chronic pain: Secondary | ICD-10-CM | POA: Diagnosis not present

## 2021-07-15 DIAGNOSIS — M5442 Lumbago with sciatica, left side: Secondary | ICD-10-CM | POA: Diagnosis not present

## 2021-07-15 NOTE — Patient Instructions (Addendum)
Good to see you  Continue HEP  Start PT Continue Tylenol [phone] mg 2-3 times a day for pain relief  Get soft inserts for shoes use them at all times Follow up in 3-4 weeks

## 2021-07-19 ENCOUNTER — Ambulatory Visit (HOSPITAL_COMMUNITY): Payer: 59 | Admitting: Licensed Clinical Social Worker

## 2021-07-19 ENCOUNTER — Encounter: Payer: 59 | Admitting: Physical Medicine & Rehabilitation

## 2021-07-26 ENCOUNTER — Telehealth: Payer: Self-pay | Admitting: Nurse Practitioner

## 2021-07-26 NOTE — Telephone Encounter (Signed)
Pts states a restriction letter was suppose to be fax to her employer pertaining her being able to sit  Pt requesting a c/b w/ a status update  Employer Fax [phone]  Ref # [id number]

## 2021-07-27 NOTE — Telephone Encounter (Signed)
Last OV 07/01/21 pt mentions a letter to express her need to not stand for long periods of time but a letter was not sent to her work. ? ?Ok to send? If so- any restrictions you want noted? ?

## 2021-07-28 NOTE — Therapy (Signed)
OUTPATIENT PHYSICAL THERAPY THORACOLUMBAR EVALUATION   Patient Name: [name] MRN: [id number] DOB:September 14, 1970, 51 y.o., female Today's Date: 07/29/2021   PT End of Session - 07/29/21 1102     Visit Number 1    Number of Visits 12    Date for PT Re-Evaluation 09/09/21    Authorization Type [employer]    PT Start Time 1100    PT Stop Time 1148    PT Time Calculation (min) 48 min    Activity Tolerance Patient tolerated treatment well    Behavior During Therapy [facility] for tasks assessed/performed             Past Medical History:  Diagnosis Date   Gout    Hypertension    Migraines    Past Surgical History:  Procedure Laterality Date   ABDOMINAL HYSTERECTOMY  2008   Patient Active Problem List   Diagnosis Date Noted   PTSD (post-traumatic stress disorder) 05/05/2021   Strain of lumbar region 05/05/2021   Situational anxiety 05/05/2021   Primary osteoarthritis of right knee 11/05/2020   Patellofemoral arthritis of right knee 06/21/2018   Cervical radiculopathy 06/21/2018   Essential hypertension 05/14/2018   Tobacco abuse 05/14/2018   Gout 05/14/2018    PCP: Elenore Paddy, NP  REFERRING PROVIDER: Elenore Paddy, NP  REFERRING DIAG: [id number] (ICD-10-CM) - Sciatica of left side   THERAPY DIAG:  Radiculopathy, lumbar region  Pain in left leg  ONSET DATE: Worsening over 1 yr.   SUBJECTIVE:                                                                                                                                                                                           SUBJECTIVE STATEMENT: Patient presents with chronic and worsening L sided back and hip pain over the past year.  In April 2022 when she started working at [employer] she began to develop increasing pain with her tasks that included pulling and twisting.  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 She continues to have pain with standing , sitting and walking long periods of time. She denies  sensory changes, weakness or red flags.  At times when pain is severe her back will be sharp and cause her to almost fall.    The pain is fairly constant. She feels like she is spending money on things and nothing is working for her.      PERTINENT HISTORY: She recently got a hip injection for bursitis. H/o anxiety, PTSD this has resolved.   PAIN:  Are you having pain? Yes NPRS scale: 4/10, last night it was a  10/10.   Pain location: L low back to hip, post thigh and calf Pain orientation: Left, Posterior, Proximal, and Distal  PAIN TYPE: aching and cramping , tinging in bottom of feet Pain description: aching   Aggravating factors: standing, work, lifting, pulling  Relieving factors: not much has tried heat, shoes, pillows, back brace   PRECAUTIONS: None  WEIGHT BEARING RESTRICTIONS No  FALLS:  Has patient fallen in last 6 months? No, Number of falls: 0, does feel like she could at times when pain is back   LIVING ENVIRONMENT: Lives with: lives with their family, 2 adult children Lives in: House/apartment Stairs: No;  Has following equipment at home: None  OCCUPATION: [employer] Conservation officer, nature , full time   PLOF: Independent  PATIENT GOALS : Less pain , play with my grand babies    OBJECTIVE:   DIAGNOSTIC FINDINGS:  None   PATIENT SURVEYS:  FOTO done   SCREENING FOR RED FLAGS: Bowel or bladder incontinence: No Spinal tumors: No Cauda equina syndrome: No Compression fracture: No Abdominal aneurysm: No  COGNITION:  Overall cognitive status: Within functional limits for tasks assessed     SENSATION:  Light touch: Appears intact  Stereognosis: Appears intact  Hot/Cold: Appears intact  Proprioception: Appears intact  MUSCLE LENGTH: Hamstrings: Right WFL deg; Left WFL deg Thomas test: Right tight  deg; Left tight  deg  POSTURE:  Increased anterior pelvis tilt and increased lordosis  PALPATION: Pain with palpation along Lt thoracolumbar paraspinals  with spasm, into glutes, min in piriformis   LUMBARAROM/PROM  A/PROM A/PROM  07/29/2021  Flexion Distal shin with pain   Extension WFL relief of pain   Right lateral flexion   Left lateral flexion   Right rotation No pain , WFL   Left rotation Pain, WFL   (Blank rows = not tested)  LE AROM/PROM:  A/PROM Right 07/29/2021 Left 07/29/2021  Hip flexion    Hip extension    Hip abduction    Hip adduction    Hip internal rotation    Hip external rotation    Knee flexion    Knee extension    Ankle dorsiflexion    Ankle plantarflexion    Ankle inversion    Ankle eversion     (Blank rows = not tested)  LE MMT:  MMT Right 07/29/2021 Left 07/29/2021  Hip flexion 3+/5 3+/5  Hip extension    Hip abduction    Hip adduction    Hip internal rotation    Hip external rotation    Knee flexion 5/5 5/5  Knee extension 5/5 5/5  Ankle dorsiflexion 5/5 5/5  Ankle plantarflexion    Ankle inversion    Ankle eversion     (Blank rows = not tested)  LUMBAR SPECIAL TESTS:  Straight leg raise test: Negative, Slump test: Positive, and Single leg stance test: Negative Hip pain with FADIR L    TODAY'S TREATMENT  PT eval, HEP , core strength for support at work   PATIENT EDUCATION:  Education details: PT/POC, HEP  Person educated: Patient Education method: Explanation Education comprehension: verbalized understanding and needs further education   HOME EXERCISE PROGRAM: Hip flexo, post pelvic tilt   Access Code: [email or web address] URL: [email or web address] Date: 07/29/2021 Prepared by: Karie Mainland  Exercises Supine Posterior Pelvic Tilt - 2 x daily - 7 x weekly - 2 sets - 10 reps - 5 hold Supine Lower Trunk Rotation - 2 x daily - 7 x weekly - 2 sets - 10 reps -  10 hold Quadruped Cat with Posterior Pelvic Tilt - 2 x daily - 7 x weekly - 1 sets - 10 reps - 10-15 hold Thomas Stretch on Table - 2 x daily - 7 x weekly - 1 sets - 3 reps - 30 hold   ASSESSMENT:  CLINICAL  IMPRESSION: Patient is a 51 y.o. female who was seen today for physical therapy evaluation and treatment for L sciatica , back pain . Symptoms today were consistent with lumbar radiculopathy as her post hip was minimally tender. Her pain does radiate to calf at times and had pos Slump test. Long axis distraction improved symptoms. Lumbar region in spasm.    OBJECTIVE IMPAIRMENTS decreased mobility, difficulty walking, decreased ROM, decreased strength, hypomobility, increased fascial restrictions, increased muscle spasms, impaired flexibility, improper body mechanics, postural dysfunction, and pain.   ACTIVITY LIMITATIONS cleaning, community activity, and occupation.   PERSONAL FACTORS 1 comorbidity: hip pain/bursitis  are also affecting patient's functional outcome.    REHAB POTENTIAL: Excellent  CLINICAL DECISION MAKING: Evolving/moderate complexity  EVALUATION COMPLEXITY: Moderate   GOALS: Goals reviewed with patient? Yes  SHORT TERM GOALS:  Pt will understand posture, lifting for work tolerance and safety.  Baseline: unknown Target date: 08/12/2021 Goal status: INITIAL  2.  Pt will be I with initial routine for core HEP  Baseline: unknown, has flexibility  Target date: 08/12/2021 Goal status: INITIAL  3.  Pt will understand FOTO results and potential to improve condition.  Baseline: taken Target date: 08/12/2021 Goal status: IN PROGRESS   LONG TERM GOALS:  Pt will be I with more advanced HEP for core, trunk  Baseline: unknown Target date: 09/23/2021 Goal status: INITIAL  2.  Pt will be able to stand for work as needed with pain managed to 5/10 or less end of the shift.  Baseline: pain can be severe after work Target date: 09/23/2021 Goal status: INITIAL  3.  Pt will be able to report being able to feel symmetry in hips, wear a belt as previous Baseline: Lhip sits higher  Target date: 09/23/2021 Goal status: INITIAL  4.  Pt will be able to participate in  recreation, social events without limitation of pain.  Baseline: pain after dancing  Target date: 09/23/2021 Goal status: INITIAL  PLAN: PT FREQUENCY: 2x/week  PT DURATION: 8 weeks  PLANNED INTERVENTIONS: Therapeutic exercises, Therapeutic activity, Neuromuscular re-education, Balance training, Gait training, Patient/Family education, Joint mobilization, Dry Needling, Electrical stimulation, Spinal manipulation, Spinal mobilization, Cryotherapy, Moist heat, and Manual therapy  PLAN FOR NEXT SESSION: check HEP, manual, core   Karie Mainland, PT 07/29/21 7:49 PM Phone: [phone] Fax: [phone]    Kelsie Kramp, PT 07/29/2021, 7:30 PM

## 2021-07-28 NOTE — Telephone Encounter (Signed)
Faxed restriction letter to [phone], received confirmation fax. ? ?Called patient and let her know letter was faxed to her employer per her request.  ?

## 2021-07-29 ENCOUNTER — Other Ambulatory Visit: Payer: Self-pay

## 2021-07-29 ENCOUNTER — Ambulatory Visit: Payer: 59 | Attending: Nurse Practitioner | Admitting: Physical Therapy

## 2021-07-29 DIAGNOSIS — R252 Cramp and spasm: Secondary | ICD-10-CM

## 2021-07-29 DIAGNOSIS — R262 Difficulty in walking, not elsewhere classified: Secondary | ICD-10-CM

## 2021-07-29 DIAGNOSIS — M5432 Sciatica, left side: Secondary | ICD-10-CM | POA: Diagnosis not present

## 2021-07-29 DIAGNOSIS — M79605 Pain in left leg: Secondary | ICD-10-CM

## 2021-07-29 DIAGNOSIS — M5416 Radiculopathy, lumbar region: Secondary | ICD-10-CM

## 2021-07-29 DIAGNOSIS — M2560 Stiffness of unspecified joint, not elsewhere classified: Secondary | ICD-10-CM | POA: Diagnosis present

## 2021-08-01 NOTE — Telephone Encounter (Signed)
Contacted patient's employer at [phone] to receive new fax number as I received a message that it was not the correct fax and that their faxes do not start with an [phone]. They gave me 2 numbers to try.  ? ?Payroll fax: [phone] ?Other fax: [phone] ? ?Sent to both faxes, received confirmation fax from [phone].  ?

## 2021-08-03 ENCOUNTER — Other Ambulatory Visit: Payer: Self-pay

## 2021-08-03 ENCOUNTER — Ambulatory Visit: Payer: 59 | Admitting: Emergency Medicine

## 2021-08-03 ENCOUNTER — Ambulatory Visit: Payer: 59 | Admitting: Physical Therapy

## 2021-08-03 ENCOUNTER — Encounter: Payer: Self-pay | Admitting: Physical Therapy

## 2021-08-03 DIAGNOSIS — M79605 Pain in left leg: Secondary | ICD-10-CM

## 2021-08-03 DIAGNOSIS — M5416 Radiculopathy, lumbar region: Secondary | ICD-10-CM | POA: Diagnosis not present

## 2021-08-03 DIAGNOSIS — R262 Difficulty in walking, not elsewhere classified: Secondary | ICD-10-CM

## 2021-08-03 NOTE — Therapy (Signed)
?OUTPATIENT PHYSICAL THERAPY TREATMENT NOTE ? ? ?Patient Name: [name] ?MRN: [id number] ?DOB:1970-11-24, 51 y.o., female ?Today's Date: 08/03/2021 ? ?PCP: Elenore Paddy, NP ?REFERRING PROVIDER: Elenore Paddy, NP ? ? PT End of Session - 08/03/21 1234   ? ? Visit Number 2   ? Number of Visits 12   ? Date for PT Re-Evaluation 09/09/21   ? Authorization Type [facility] [employer]   ? PT Start Time 1230   ? PT Stop Time 1315   ? PT Time Calculation (min) 45 min   ? ?  ?  ? ?  ? ? ?Past Medical History:  ?Diagnosis Date  ? Gout   ? Hypertension   ? Migraines   ? ?Past Surgical History:  ?Procedure Laterality Date  ? ABDOMINAL HYSTERECTOMY  2008  ? ?Patient Active Problem List  ? Diagnosis Date Noted  ? PTSD (post-traumatic stress disorder) 05/05/2021  ? Strain of lumbar region 05/05/2021  ? Situational anxiety 05/05/2021  ? Primary osteoarthritis of right knee 11/05/2020  ? Patellofemoral arthritis of right knee 06/21/2018  ? Cervical radiculopathy 06/21/2018  ? Essential hypertension 05/14/2018  ? Tobacco abuse 05/14/2018  ? Gout 05/14/2018  ?PCP: Elenore Paddy, NP ?  ?REFERRING PROVIDER: Elenore Paddy, NP ?  ?REFERRING DIAG: M54.32 (ICD-10-CM) - Sciatica of left side  ? ?THERAPY DIAG:  ?Radiculopathy, lumbar region ? ?Pain in left leg ? ?Difficulty in walking, not elsewhere classified ? ?PERTINENT HISTORY: She recently got a hip injection for bursitis. H/o anxiety, PTSD this has resolved.  ? ?PRECAUTIONS: None ? ?SUBJECTIVE: I did not want to get out of the bed. I am here. I am doing the exercises.  ? ?PAIN:  ?Are you having pain? Yes ?NPRS scale: 8/10 ?Pain location: L low back to hip, post thigh and calf ?Pain orientation: Left, Posterior, Proximal, and Distal  ?PAIN TYPE: aching and cramping , tinging in bottom of feet ?Pain description: aching   ?Aggravating factors: standing, work, lifting, pulling  ?Relieving factors: not much has tried heat, shoes, pillows, back brace  ? ? ? ? ?OBJECTIVE:  ?  ?DIAGNOSTIC  FINDINGS:  ?None  ?  ?PATIENT SURVEYS:  ?FOTO 40% 57% prediction ?  ?SCREENING FOR RED FLAGS: ?Bowel or bladder incontinence: No ?Spinal tumors: No ?Cauda equina syndrome: No ?Compression fracture: No ?Abdominal aneurysm: No ?  ?COGNITION: ?         Overall cognitive status: Within functional limits for tasks assessed              ?          ?SENSATION: ?         Light touch: Appears intact ?         Stereognosis: Appears intact ?         Hot/Cold: Appears intact ?         Proprioception: Appears intact ?  ?MUSCLE LENGTH: ?Hamstrings: Right WFL deg; Left WFL deg ?Thomas test: Right tight  deg; Left tight  deg ?  ?POSTURE:  ?Increased anterior pelvis tilt and increased lordosis ?  ?PALPATION: ?Pain with palpation along Lt thoracolumbar paraspinals with spasm, into glutes, min in piriformis  ?  ?LUMBARAROM/PROM ?  ?A/PROM A/PROM  ?07/29/2021  ?Flexion Distal shin with pain   ?Extension WFL relief of pain   ?Right lateral flexion    ?Left lateral flexion    ?Right rotation No pain , WFL   ?Left rotation Pain, WFL  ? (Blank rows =  not tested) ?  ?LE AROM/PROM: ?  ?A/PROM Right ?07/29/2021 Left ?07/29/2021  ?Hip flexion      ?Hip extension      ?Hip abduction      ?Hip adduction      ?Hip internal rotation      ?Hip external rotation      ?Knee flexion      ?Knee extension      ?Ankle dorsiflexion      ?Ankle plantarflexion      ?Ankle inversion      ?Ankle eversion      ? (Blank rows = not tested) ?  ?LE MMT: ?  ?MMT Right ?07/29/2021 Left ?07/29/2021  ?Hip flexion 3+/5 3+/5  ?Hip extension      ?Hip abduction      ?Hip adduction      ?Hip internal rotation      ?Hip external rotation      ?Knee flexion 5/5 5/5  ?Knee extension 5/5 5/5  ?Ankle dorsiflexion 5/5 5/5  ?Ankle plantarflexion      ?Ankle inversion      ?Ankle eversion      ? (Blank rows = not tested) ?  ?LUMBAR SPECIAL TESTS:  ?Straight leg raise test: Negative, Slump test: Positive, and Single leg stance test: Negative ?Hip pain with FADIR L  ?  ?  ?TODAY'S TREATMENT   [facility]                                                [facility]: [facility] ?Therapeutic Exercise: ?Nustep L2 UE/LE x 5 minutes ?Seated lumbar stretch ball vs using hands on knees ?Cat/camel into [place] pose ?PPT - mod cues  ?PPT with bent knee fall out alternating ?LTR x 10 ?Hip flexor stretch-given band for assist ? ? ?[facility]                                                [facility]: 07/29/21 ?PT eval, HEP , core strength for support at work ?  ?  ?PATIENT EDUCATION:  ?Education details: PT/POC, HEP  ?Person educated: Patient ?Education method: Explanation ?Education comprehension: verbalized understanding and needs further education ?  ?  ?HOME EXERCISE PROGRAM: ?Hip flexo, post pelvic tilt  ?  ?Access Code: [email or web address] ?URL: [email or web address] ?Date: 07/29/2021 ?Prepared by: Karie MainlandJennifer Paa ?  ?Exercises ?Supine Posterior Pelvic Tilt - 2 x daily - 7 x weekly - 2 sets - 10 reps - 5 hold ?Supine Lower Trunk Rotation - 2 x daily - 7 x weekly - 2 sets - 10 reps - 10 hold ?Quadruped Cat with Posterior Pelvic Tilt - 2 x daily - 7 x weekly - 1 sets - 10 reps - 10-15 hold ?Thomas Stretch on Table - 2 x daily - 7 x weekly - 1 sets - 3 reps - 30 hold ?  ?  ?ASSESSMENT: ?  ?CLINICAL IMPRESSION: ?Patient reports compliance with HEP and requests band to use with hip flexor stretch. Full review of HEP today and start of Nustep. Needs mod cues PPT. Began TPR to left lumbar paraspinal with  increased pain. Will attempt to get TPDN completed at next session.  ?  ?  ?OBJECTIVE IMPAIRMENTS decreased  mobility, difficulty walking, decreased ROM, decreased strength, hypomobility, increased fascial restrictions, increased muscle spasms, impaired flexibility, improper body mechanics, postural dysfunction, and pain.  ?  ?ACTIVITY LIMITATIONS cleaning, community activity, and occupation.  ?  ?PERSONAL FACTORS 1 comorbidity: hip pain/bursitis  are also affecting patient's functional outcome.  ?  ?  ?REHAB  POTENTIAL: Excellent ?  ?CLINICAL DECISION MAKING: Evolving/moderate complexity ?  ?EVALUATION COMPLEXITY: Moderate ?  ?  ?GOALS: ?Goals reviewed with patient? Yes ?  ?SHORT TERM GOALS: ?  ?Pt will understand posture, lifting for work tolerance and safety.  ?Baseline: unknown ?Target date: 08/12/2021 ?Goal status: INITIAL ?  ?2.  Pt will be I with initial routine for core HEP    ?Baseline: unknown, has flexibility  ?Target date: 08/12/2021 ?Goal status: INITIAL ?  ?3.  Pt will understand FOTO results and potential to improve condition.  ?Baseline: taken ?Target date: 08/12/2021 ?Goal status: IN PROGRESS ?  ?  ?LONG TERM GOALS: ?  ?Pt will be I with more advanced HEP for core, trunk  ?Baseline: unknown ?Target date: 09/23/2021 ?Goal status: INITIAL ?  ?2.  Pt will be able to stand for work as needed with pain managed to 5/10 or less end of the shift.  ?Baseline: pain can be severe after work ?Target date: 09/23/2021 ?Goal status: INITIAL ?  ?3.  Pt will be able to report being able to feel symmetry in hips, wear a belt as previous ?Baseline: Lhip sits higher  ?Target date: 09/23/2021 ?Goal status: INITIAL ?  ?4.  Pt will be able to participate in recreation, social events without limitation of pain.  ?Baseline: pain after dancing  ?Target date: 09/23/2021 ?Goal status: INITIAL ?  ?PLAN: ?PT FREQUENCY: 2x/week ?  ?PT DURATION: 8 weeks ?  ?PLANNED INTERVENTIONS: Therapeutic exercises, Therapeutic activity, Neuromuscular re-education, Balance training, Gait training, Patient/Family education, Joint mobilization, Dry Needling, Electrical stimulation, Spinal manipulation, Spinal mobilization, Cryotherapy, Moist heat, and Manual therapy ?  ?PLAN FOR NEXT SESSION: check HEP, manual, core , TPDN next visit.  ? ? ?Jannette Spanner, PTA ?08/03/21 2:12 PM ?Phone: [phone] ?Fax: [phone]  ? ?   ?

## 2021-08-04 ENCOUNTER — Ambulatory Visit: Payer: Self-pay | Admitting: Nurse Practitioner

## 2021-08-04 ENCOUNTER — Encounter: Payer: 59 | Attending: Physical Medicine & Rehabilitation | Admitting: Physical Medicine & Rehabilitation

## 2021-08-04 ENCOUNTER — Encounter: Payer: Self-pay | Admitting: Physical Medicine & Rehabilitation

## 2021-08-04 VITALS — BP 113/79 | HR 84 | Temp 98.3°F | Ht 67.0 in | Wt 166.0 lb

## 2021-08-04 DIAGNOSIS — M1711 Unilateral primary osteoarthritis, right knee: Secondary | ICD-10-CM | POA: Diagnosis not present

## 2021-08-04 NOTE — Progress Notes (Signed)
RIGHT Knee injection  ultrasound guidance)  Indication:RIGHT Knee pain not relieved by medication management and other conservative care.  Informed consent was obtained after describing risks and benefits of the procedure with the patient, this includes bleeding, bruising, infection and medication side effects. The patient wishes to proceed and has given written consent. The patient was placed in a recumbent position. The medial aspect of the knee was marked and prepped with Betadine and alcohol. It was then entered with a 25-gauge 1-1/2 inch needle and 1 mL of 1% lidocaine was injected into the skin and subcutaneous tissue. Then another 21g 2"needle was inserted into the knee joint. After negative draw back for blood, a solution containing 5ML ofZilretta (32mg Triamcinolone) injected. The patient tolerated the procedure well. Post procedure instructions were given.    

## 2021-08-04 NOTE — Patient Instructions (Signed)
Knee Injection ?A knee injection is a procedure to get medicine into your knee joint to relieve the pain, swelling, and stiffness of arthritis. Your health care provider uses a needle to inject medicine, which may also help to lubricate and cushion your knee joint. You may need more than one injection. ?Tell a health care provider about: ?Any allergies you have. ?All medicines you are taking, including vitamins, herbs, eye drops, creams, and over-the-counter medicines. ?Any problems you or family members have had with anesthetic medicines. ?Any blood disorders you have. ?Any surgeries you have had. ?Any medical conditions you have. ?Whether you are pregnant or may be pregnant. ?What are the risks? ?Generally, this is a safe procedure. However, problems may occur, including: ?Infection. ?Bleeding. ?Symptoms that get worse. ?Damage to the area around your knee. ?Allergic reaction to any of the medicines. ?Skin reactions from repeated injections. ?What happens before the procedure? ?Ask your health care provider about: ?Changing or stopping your regular medicines. This is especially important if you are taking diabetes medicines or blood thinners. ?Taking medicines such as aspirin and ibuprofen. These medicines can thin your blood. Do not take these medicines unless your health care provider tells you to take them. ?Taking over-the-counter medicines, vitamins, herbs, and supplements. ?Plan to have a responsible adult take you home from the hospital or clinic. ?What happens during the procedure? ? ?You will sit or lie down in a position for your knee to be treated. ?The skin over your kneecap will be cleaned with a germ-killing soap. ?You will be given a medicine that numbs the area (local anesthetic). You may feel some stinging. ?The medicine will be injected into your knee. The needle is carefully placed between your kneecap and your knee. The medicine is injected into the joint space. ?The needle will be removed at  the end of the procedure. ?A bandage (dressing) may be placed over the injection site. ?The procedure may vary among health care providers and hospitals. ?What can I expect after the procedure? ?Your blood pressure, heart rate, breathing rate, and blood oxygen level will be monitored until you leave the hospital or clinic. ?You may have to move your knee through its full range of motion. This helps to get all the medicine into your joint space. ?You will be watched to make sure that you do not have a reaction to the injected medicine. ?You may feel more pain, swelling, and warmth than you did before the injection. This reaction may last about 1-2 days. ?Follow these instructions at home: ?Medicines ?Take over-the-counter and prescription medicines only as told by your health care provider. ?Ask your health care provider if the medicine prescribed to you requires you to avoid driving or using machinery. ?Do not take medicines such as aspirin and ibuprofen unless your health care provider tells you to take them. ?Injection site care ?Follow instructions from your health care provider about: ?How to take care of your puncture site. ?When and how you should change your dressing. ?When you should remove your dressing. ?Check your injection area every day for signs of infection. Check for: ?More redness, swelling, or pain after 2 days. ?Fluid or blood. ?Pus or a bad smell. ?Warmth. ?Managing pain, stiffness, and swelling ? ?If directed, put ice on the injection area. To do this: ?Put ice in a plastic bag. ?Place a towel between your skin and the bag. ?Leave the ice on for 20 minutes, 2-3 times per day. ?Remove the ice if your skin turns bright red.   This is very important. If you cannot feel pain, heat, or cold, you have a greater risk of damage to the area. ?Do not apply heat to your knee. ?Raise (elevate) the injection area above the level of your heart while you are sitting or lying down. ?General instructions ?If you  were given a dressing, keep it dry until your health care provider says it can be removed. Ask your health care provider when you can start showering or bathing. ?Avoid strenuous activities for as long as directed by your health care provider. Ask your health care provider when you can return to your normal activities. ?Keep all follow-up visits. This is important. You may need more injections. ?Contact a health care provider if you have: ?A fever. ?Warmth in your injection area. ?Fluid, blood, or pus coming from your injection site. ?Symptoms at your injection site that last longer than 2 days after your procedure. ?Get help right away if: ?Your knee turns very red. ?Your knee becomes very swollen. ?Your knee is in severe pain. ?Summary ?A knee injection is a procedure to get medicine into your knee joint to relieve the pain, swelling, and stiffness of arthritis. ?A needle is carefully placed between your kneecap and your knee to inject medicine into the joint space. ?Before the procedure, ask your health care provider about changing or stopping your regular medicines, especially if you are taking diabetes medicines or blood thinners. ?Contact your health care provider if you have any problems or questions after your procedure. ?This information is not intended to replace advice given to you by your health care provider. Make sure you discuss any questions you have with your health care provider. ?Document Revised: 10/29/2019 Document Reviewed: 10/29/2019 ?Elsevier Patient Education ? 2022 Elsevier Inc. ? ?

## 2021-08-05 ENCOUNTER — Ambulatory Visit: Payer: 59 | Admitting: Sports Medicine

## 2021-08-05 NOTE — Progress Notes (Signed)
? ? [name] D.Judd Gaudier ?[facility] ?[address] [place] [address] ?Phone: [phone] ?  ?Assessment and Plan:   ?  ?1. Chronic left-sided low back pain with left-sided sciatica ?2. Greater trochanteric bursitis of left hip ?-Chronic with exacerbation, subsequent visit ?- Continued left hip pain with radicular symptoms most consistent with sciatica with irritation from piriformis and gluteal musculature ?- Patient received benefit from CSI and decreasing lateral hip pain, however continued to have sciatica symptoms ?- Patient is scheduled to complete PT next week and then will transition to HEP moving forward ?- Can continue to use remaining Celebrex as needed for pain control ?- Recommend starting Tylenol 500 to 1000 mg 2-3 times a day for day-to-day pain relief ?- May start tizanidine 4 mg every 12 hours as needed for muscle spasms.  Recommend patient start this medication on a day when she is not driving or working to see if it makes her drowsy ?- Continue to use soft inserts for shoes at all times ?  ?Pertinent previous records reviewed include PT note 08/08/2021 ?  ?Follow Up: 3 to 4 weeks for reevaluation.  If no improvement or worsening of symptoms at that time could consider advanced imaging of hip versus lumbar spine based on physical exam and symptoms ?  ?Subjective:   ?I, [name], am serving as a Neurosurgeon for [employer] ? ?Chief Complaint: left sided low back pain  ? ?HPI:  ?06/17/2021 ?Patient is is 51 year old female complaining of left sided low back pain. Patient states that she has had 1 week of severe left low back pain that has progressed over the last day to include weakness and severe pain down the left leg with new onset numbness and tingling as well as weakness radiating into the L foot.  PCP who prescribed Flexeril and meloxicam with have not helped alleviate her pain.  She is also tried [place] powder, ice and heat which did not help. No  MOI No shots no surgery conservative treatment preferred  ?  ?07/15/2021 ?Patient states that she would like a referral to the [facility] , pain in the inguinal area that wraps from the low back pain when she stands and puts pressure on it, received a doctors note from dr gray so she can sit and stand , HEP has been doing them causes pain sometimes, Monday night was the worst pain, would like to discuss MRI  ?  ?08/08/2021 ?Patient states that she's working it out , still having flare ups  ? ? ?Relevant Historical Information: Hypertension, OA right knee ? ?Additional pertinent review of systems negative. ? ? ?Current Outpatient Medications:  ?  amLODipine (NORVASC) 10 MG tablet, TAKE 1 TABLET(10 MG) BY MOUTH DAILY, Disp: 90 tablet, Rfl: 1 ?  RESTASIS 0.05 % ophthalmic emulsion, 1 drop 2 (two) times daily., Disp: , Rfl:  ?  tiZANidine (ZANAFLEX) 4 MG tablet, Take 1 tablet (4 mg total) by mouth every 12 (twelve) hours as needed for muscle spasms., Disp: 30 tablet, Rfl: 0 ?  topiramate (TOPAMAX) 25 MG tablet, Take 1 tablet (25 mg total) by mouth 2 (two) times daily., Disp: 180 tablet, Rfl: 3 ?  aspirin-acetaminophen-caffeine (EXCEDRIN MIGRAINE) 250-250-65 MG tablet, Take 2 tablets by mouth every 6 (six) hours as needed for headache. (Patient not taking: Reported on 08/04/2021), Disp: , Rfl:  ?  celecoxib (CELEBREX) 100 MG capsule, Take 1 capsule (100 mg total) by mouth 2 (two) times daily. (  Patient not taking: Reported on 07/29/2021), Disp: 60 capsule, Rfl: 0 ?  diazepam (VALIUM) 5 MG tablet, Take 1 tablet (5 mg total) by mouth every 8 (eight) hours as needed for muscle spasms. (Patient not taking: Reported on 07/29/2021), Disp: 12 tablet, Rfl: 0 ?  escitalopram (LEXAPRO) 10 MG tablet, Take 1 tablet (10 mg total) by mouth daily. (Patient not taking: Reported on 07/29/2021), Disp: 90 tablet, Rfl: 1 ?  EYSUVIS 0.25 % SUSP, Instill 1 drop into both eyes as directed  1 drop 4 times day for 2 weeks then 1 drop 2 times day for  2 weeks, Disp: , Rfl:  ?  fluconazole (DIFLUCAN) 150 MG tablet, Take 1 tablet by mouth once, if symptoms persist for 72 hours you may take an additional dose. (Patient not taking: Reported on 08/04/2021), Disp: 1 tablet, Rfl: 0 ?  meloxicam (MOBIC) 15 MG tablet, Take 1 tablet (15 mg total) by mouth daily. (Patient not taking: Reported on 08/04/2021), Disp: 30 tablet, Rfl: 0 ?  Olopatadine HCl 0.2 % SOLN, Apply 1 drop to eye daily., Disp: 2.5 mL, Rfl: 0 ?  tiZANidine (ZANAFLEX) 4 MG tablet, Take 1 tablet (4 mg total) by mouth at bedtime. (Patient not taking: Reported on 08/04/2021), Disp: 30 tablet, Rfl: 0  ? ?Objective:   ?  ?Vitals:  ? 08/08/21 1306  ?BP: 122/80  ?Pulse: 82  ?SpO2: 98%  ?Weight: 166 lb (75.3 kg)  ?Height: 5\' 7"  (1.702 m)  ?  ?  ?Body mass index is 26 kg/m?.  ?  ?Physical Exam:   ? ?General: awake, alert, and oriented no acute distress, nontoxic ?Skin: no suspicious lesions or rashes ?Neuro:sensation intact distally with no dificits, normal muscle tone, no atrophy, strength 5/5 in all tested lower ext groups ?Psych: normal mood and affect, speech clear ?  ?Left hip: ?No deformity, swelling or wasting ?ROM Fexion 90, ext 30, IR 30, ER 45 ?TTP gluteal musculature, greater trochanter ?NTTP over the hip flexors, g  si joint, lumbar spine ?Negative log roll with FROM ?Negative FABER ?Positive FADIR with lateral hip pain ?Positive piriformis test ?Positive trendelenberg ?Gait normal  ? ? ?Electronically signed by:  ? [name] ?[facility] ?1:54 PM 08/08/21 ?

## 2021-08-08 ENCOUNTER — Ambulatory Visit (INDEPENDENT_AMBULATORY_CARE_PROVIDER_SITE_OTHER): Payer: 59 | Admitting: Sports Medicine

## 2021-08-08 ENCOUNTER — Encounter: Payer: Self-pay | Admitting: Physical Therapy

## 2021-08-08 ENCOUNTER — Other Ambulatory Visit: Payer: Self-pay | Admitting: Emergency Medicine

## 2021-08-08 ENCOUNTER — Telehealth: Payer: Self-pay | Admitting: Nurse Practitioner

## 2021-08-08 ENCOUNTER — Other Ambulatory Visit: Payer: Self-pay

## 2021-08-08 ENCOUNTER — Ambulatory Visit: Payer: 59 | Admitting: Physical Therapy

## 2021-08-08 VITALS — BP 122/80 | HR 82 | Ht 67.0 in | Wt 166.0 lb

## 2021-08-08 DIAGNOSIS — M7062 Trochanteric bursitis, left hip: Secondary | ICD-10-CM | POA: Diagnosis not present

## 2021-08-08 DIAGNOSIS — M79605 Pain in left leg: Secondary | ICD-10-CM

## 2021-08-08 DIAGNOSIS — M5442 Lumbago with sciatica, left side: Secondary | ICD-10-CM

## 2021-08-08 DIAGNOSIS — M5416 Radiculopathy, lumbar region: Secondary | ICD-10-CM

## 2021-08-08 DIAGNOSIS — R252 Cramp and spasm: Secondary | ICD-10-CM

## 2021-08-08 DIAGNOSIS — R262 Difficulty in walking, not elsewhere classified: Secondary | ICD-10-CM

## 2021-08-08 DIAGNOSIS — G8929 Other chronic pain: Secondary | ICD-10-CM | POA: Diagnosis not present

## 2021-08-08 MED ORDER — TIZANIDINE HCL 4 MG PO TABS: 4.0000 mg | ORAL_TABLET | Freq: Two times a day (BID) | ORAL | 0 refills | Status: DC | PRN

## 2021-08-08 NOTE — Patient Instructions (Addendum)
Good to see you  ?Start tizanidine 4 mg every 12 hours as needed or muscle spasms  ?Continue Celebrex as needed  ?Continue HEP ?3-4 week follow up  ?

## 2021-08-08 NOTE — Telephone Encounter (Signed)
Patient needs amlodopine refilled  - patient is completely out of medication. ? ?Pharmacy:  [facility] on [address] ?

## 2021-08-08 NOTE — Therapy (Signed)
OUTPATIENT PHYSICAL THERAPY TREATMENT NOTE   Patient Name: [name] MRN: [id number] DOB:[birth year], 51 y.o., female Today's Date: 08/08/2021  PCP: Ailene Ards, NP REFERRING PROVIDER: Ailene Ards, NP   PT End of Session - 08/08/21 1020     Visit Number 3    Number of Visits 12    Date for PT Re-Evaluation 09/09/21    Authorization Type [employer]    PT Start Time 1018    PT Stop Time 1100    PT Time Calculation (min) 42 min             Past Medical History:  Diagnosis Date   Gout    Hypertension    Migraines    Past Surgical History:  Procedure Laterality Date   ABDOMINAL HYSTERECTOMY  2008   Patient Active Problem List   Diagnosis Date Noted   PTSD (post-traumatic stress disorder) 05/05/2021   Strain of lumbar region 05/05/2021   Situational anxiety 05/05/2021   Primary osteoarthritis of right knee 11/05/2020   Patellofemoral arthritis of right knee 06/21/2018   Cervical radiculopathy 06/21/2018   Essential hypertension 05/14/2018   Tobacco abuse 05/14/2018   Gout 05/14/2018  PCP: Ailene Ards, NP   REFERRING PROVIDER: Ailene Ards, NP   REFERRING DIAG: [phone] (ICD-10-CM) - Sciatica of left side   THERAPY DIAG:  Radiculopathy, lumbar region  Pain in left leg  Difficulty in walking, not elsewhere classified  Cramp and spasm  PERTINENT HISTORY: She recently got a hip injection for bursitis. H/o anxiety, PTSD this has resolved.   PRECAUTIONS: None  SUBJECTIVE: I am doing the exercises. I stop when it hurts.  My son gave me a biofreeze patch and it helped a lot. I was able to sleep better. The   PAIN:  Are you having pain? Yes NPRS scale: 5/10 Pain location: L low back to hip, post thigh and calf Pain orientation: Left, Posterior, Proximal, and Distal  PAIN TYPE: aching and cramping , tinging in bottom of feet Pain description: aching   Aggravating factors: standing, work, lifting, pulling  Relieving factors: not much has  tried heat, shoes, pillows, back brace      OBJECTIVE:    DIAGNOSTIC FINDINGS:  None    PATIENT SURVEYS:  FOTO 40% 57% prediction   SCREENING FOR RED FLAGS: Bowel or bladder incontinence: No Spinal tumors: No Cauda equina syndrome: No Compression fracture: No Abdominal aneurysm: No   COGNITION:          Overall cognitive status: Within functional limits for tasks assessed                        SENSATION:          Light touch: Appears intact          Stereognosis: Appears intact          Hot/Cold: Appears intact          Proprioception: Appears intact   MUSCLE LENGTH: Hamstrings: Right WFL deg; Left WFL deg Thomas test: Right tight  deg; Left tight  deg   POSTURE:  Increased anterior pelvis tilt and increased lordosis   PALPATION: Pain with palpation along Lt thoracolumbar paraspinals with spasm, into glutes, min in piriformis    LUMBARAROM/PROM   A/PROM A/PROM  07/29/2021  Flexion Distal shin with pain   Extension WFL relief of pain   Right lateral flexion    Left lateral flexion    Right  rotation No pain , WFL   Left rotation Pain, WFL   (Blank rows = not tested)   LE AROM/PROM:   A/PROM Right 07/29/2021 Left 07/29/2021  Hip flexion      Hip extension      Hip abduction      Hip adduction      Hip internal rotation      Hip external rotation      Knee flexion      Knee extension      Ankle dorsiflexion      Ankle plantarflexion      Ankle inversion      Ankle eversion       (Blank rows = not tested)   LE MMT:   MMT Right 07/29/2021 Left 07/29/2021  Hip flexion 3+/5 3+/5  Hip extension      Hip abduction      Hip adduction      Hip internal rotation      Hip external rotation      Knee flexion 5/5 5/5  Knee extension 5/5 5/5  Ankle dorsiflexion 5/5 5/5  Ankle plantarflexion      Ankle inversion      Ankle eversion       (Blank rows = not tested)   LUMBAR SPECIAL TESTS:  Straight leg raise test: Negative, Slump test: Positive, and Single  leg stance test: Negative Hip pain with FADIR L      TODAY'S TREATMENT  [facility] Adult PT Treatment:                                                DATE: 08/08/21 Therapeutic Exercise: Nustep L5 UE/LE x 5 minutes Seated lumbar stretch ball vs using hands on knees Cat/camel into [place] pose Single knee to chest Double Knee to chest  PPT - min cues Table top hold 10 sec x 3  LTR x 10 SLR x15  each  Hip flexor stretch-with strap  Trigger Point Dry-Needling  Treatment instructions: Expect mild to moderate muscle soreness. S/S of pneumothorax if dry needled over a lung field, and to seek immediate medical attention should they occur. Patient verbalized understanding of these instructions and education.  Patient Consent Given: Yes Education handout provided: Yes Muscles treated: Left lumbar paraspinal x 3  Electrical stimulation performed: No Parameters: N/A Treatment response/outcome: twitch response, palpable Muscle lengthened Manual Therapy : IASTM  to left lumbar paraspinal   [facility] Adult PT Treatment:                                                DATE: 08/03/21 Therapeutic Exercise: Nustep L2 UE/LE x 5 minutes Seated lumbar stretch ball vs using hands on knees Cat/camel into [place] pose PPT - mod cues  PPT with bent knee fall out alternating LTR x 10 Hip flexor stretch-given band for assist   [facility] Adult PT Treatment:                                                DATE: 07/29/21 PT eval, HEP , core strength for support at work  PATIENT EDUCATION:  Education details: FOTO prediction Person educated: Patient Education method: Explanation Education comprehension: verbalized understanding and needs further education     HOME EXERCISE PROGRAM: Hip flexo, post pelvic tilt    Access Code: [email or web address] URL: [email or web address] Date: 07/29/2021 Prepared by: Raeford Razor   Exercises Supine Posterior Pelvic Tilt - 2 x daily - 7 x weekly - 2 sets - 10 reps - 5  hold Supine Lower Trunk Rotation - 2 x daily - 7 x weekly - 2 sets - 10 reps - 10 hold Quadruped Cat with Posterior Pelvic Tilt - 2 x daily - 7 x weekly - 1 sets - 10 reps - 10-15 hold Thomas Stretch on Table - 2 x daily - 7 x weekly - 1 sets - 3 reps - 30 hold     ASSESSMENT:   CLINICAL IMPRESSION: Patient reports min improvement. She continues with muscle spasm in left lumbar paraspinal. TPDN performed by Octavio Manns, DPT.  Patient reports compliance with HEP. Progressed lumbar stabilization without increased pain. FOTO report reviewed with patient. After session pt reports reduced left lumbar paraspinal.      OBJECTIVE IMPAIRMENTS decreased mobility, difficulty walking, decreased ROM, decreased strength, hypomobility, increased fascial restrictions, increased muscle spasms, impaired flexibility, improper body mechanics, postural dysfunction, and pain.    ACTIVITY LIMITATIONS cleaning, community activity, and occupation.    PERSONAL FACTORS 1 comorbidity: hip pain/bursitis  are also affecting patient's functional outcome.      REHAB POTENTIAL: Excellent   CLINICAL DECISION MAKING: Evolving/moderate complexity   EVALUATION COMPLEXITY: Moderate     GOALS: Goals reviewed with patient? Yes   SHORT TERM GOALS:   Pt will understand posture, lifting for work tolerance and safety.  Baseline: unknown Target date: 08/12/2021 Goal status: INITIAL   2.  Pt will be I with initial routine for core HEP    Baseline: unknown, has flexibility  Target date: 08/12/2021 Goal status: MET 08/08/21   3.  Pt will understand FOTO results and potential to improve condition.  Baseline: taken Target date: 08/12/2021 Goal status: MET 08/08/21     LONG TERM GOALS:   Pt will be I with more advanced HEP for core, trunk  Baseline: unknown Target date: 09/23/2021 Goal status: INITIAL   2.  Pt will be able to stand for work as needed with pain managed to 5/10 or less end of the shift.  Baseline:  pain can be severe after work Target date: 09/23/2021 Goal status: INITIAL   3.  Pt will be able to report being able to feel symmetry in hips, wear a belt as previous Baseline: Lhip sits higher  Target date: 09/23/2021 Goal status: INITIAL   4.  Pt will be able to participate in recreation, social events without limitation of pain.  Baseline: pain after dancing  Target date: 09/23/2021 Goal status: INITIAL   PLAN: PT FREQUENCY: 2x/week   PT DURATION: 8 weeks   PLANNED INTERVENTIONS: Therapeutic exercises, Therapeutic activity, Neuromuscular re-education, Balance training, Gait training, Patient/Family education, Joint mobilization, Dry Needling, Electrical stimulation, Spinal manipulation, Spinal mobilization, Cryotherapy, Moist heat, and Manual therapy   PLAN FOR NEXT SESSION: check HEP, manual, core , assess response to TPDN  , body mechanics for STG #1   Hessie Diener, PTA 08/08/21 11:37 AM Phone: [phone] Fax: [phone]

## 2021-08-08 NOTE — Patient Instructions (Signed)

## 2021-08-08 NOTE — Telephone Encounter (Signed)
Script filled today already. ?

## 2021-08-09 ENCOUNTER — Other Ambulatory Visit: Payer: Self-pay | Admitting: Emergency Medicine

## 2021-08-12 ENCOUNTER — Ambulatory Visit: Payer: 59

## 2021-08-12 ENCOUNTER — Ambulatory Visit: Payer: 59 | Admitting: Physical Therapy

## 2021-08-29 ENCOUNTER — Encounter: Payer: 59 | Admitting: Emergency Medicine

## 2021-08-30 NOTE — Progress Notes (Signed)
? ? [name] [name] ?[facility] ?[address] [place] [address] ?Phone: [phone] ?  ?Assessment and Plan:   ?  ?1. Chronic left-sided low back pain with left-sided sciatica ?2. Sciatica associated with disorder of lumbar spine ?-Chronic with exacerbation, subsequent visit ?- Moderate improvement in symptoms after completion of physical therapy, as needed use of Tylenol and tizanidine 4 mg.  Patient feels that tizanidine is helpful, however she still has a degree of limiting pain after taking medication and requests an increased dose ?- May start tizanidine 4 mg 1 to 2 tablets as needed for muscle spasms.  Refill provided ?- Continue Tylenol/Celebrex as needed ?  ?Pertinent previous records reviewed include none ?  ?Follow Up: 4 weeks for reevaluation to see if we need to change treatment plan or if we can continue with this conservative therapy plan ?  ?Subjective:   ?I, [name], am serving as a Neurosurgeon for [name] ? ?Chief Complaint: low back pain  ? ?HPI:  ?06/17/2021 ?Patient is is 51 year old female complaining of left sided low back pain. Patient states that she has had 1 week of severe left low back pain that has progressed over the last day to include weakness and severe pain down the left leg with new onset numbness and tingling as well as weakness radiating into the L foot.  PCP who prescribed Flexeril and meloxicam with have not helped alleviate her pain.  She is also tried [place] powder, ice and heat which did not help. No MOI No shots no surgery conservative treatment preferred  ?  ?07/15/2021 ?Patient states that she would like a referral to the [facility] , pain in the inguinal area that wraps from the low back pain when she stands and puts pressure on it, received a doctors note from dr gray so she can sit and stand , HEP has been doing them causes pain sometimes, Monday night was the worst pain, would like to discuss MRI  ?   ?08/08/2021 ?Patient states that she's working it out , still having flare ups  ?  ?08/31/2021 ?Patient states everything is good,  it hurts when she over works herself and it only hurts on her left hip and she can feel it coming on stopped going to Pt cause it wasn't doing anything wants her mg in muscle relaxer upped  ? ?  ?Relevant Historical Information: Hypertension, OA right knee ?Additional pertinent review of systems negative. ? ? ?Current Outpatient Medications:  ?  amLODipine (NORVASC) 10 MG tablet, TAKE 1 TABLET(10 MG) BY MOUTH DAILY, Disp: 90 tablet, Rfl: 1 ?  RESTASIS 0.05 % ophthalmic emulsion, 1 drop 2 (two) times daily., Disp: , Rfl:  ?  topiramate (TOPAMAX) 25 MG tablet, Take 1 tablet (25 mg total) by mouth 2 (two) times daily., Disp: 180 tablet, Rfl: 3 ?  aspirin-acetaminophen-caffeine (EXCEDRIN MIGRAINE) 250-250-65 MG tablet, Take 2 tablets by mouth every 6 (six) hours as needed for headache. (Patient not taking: Reported on 08/04/2021), Disp: , Rfl:  ?  celecoxib (CELEBREX) 100 MG capsule, Take 1 capsule (100 mg total) by mouth 2 (two) times daily. (Patient not taking: Reported on 07/29/2021), Disp: 60 capsule, Rfl: 0 ?  diazepam (VALIUM) 5 MG tablet, Take 1 tablet (5 mg total) by mouth every 8 (eight) hours as needed for muscle spasms. (Patient not taking: Reported on 07/29/2021), Disp: 12 tablet, Rfl: 0 ?  escitalopram (LEXAPRO) 10 MG tablet, Take 1 tablet (  10 mg total) by mouth daily. (Patient not taking: Reported on 07/29/2021), Disp: 90 tablet, Rfl: 1 ?  EYSUVIS 0.25 % SUSP, Instill 1 drop into both eyes as directed  1 drop 4 times day for 2 weeks then 1 drop 2 times day for 2 weeks, Disp: , Rfl:  ?  fluconazole (DIFLUCAN) 150 MG tablet, Take 1 tablet by mouth once, if symptoms persist for 72 hours you may take an additional dose. (Patient not taking: Reported on 08/04/2021), Disp: 1 tablet, Rfl: 0 ?  meloxicam (MOBIC) 15 MG tablet, Take 1 tablet (15 mg total) by mouth daily. (Patient not taking:  Reported on 08/04/2021), Disp: 30 tablet, Rfl: 0 ?  Olopatadine HCl 0.2 % SOLN, Apply 1 drop to eye daily., Disp: 2.5 mL, Rfl: 0 ?  tiZANidine (ZANAFLEX) 4 MG tablet, Take 1 tablet (4 mg total) by mouth at bedtime. (Patient not taking: Reported on 08/04/2021), Disp: 30 tablet, Rfl: 0 ?  tiZANidine (ZANAFLEX) 4 MG tablet, Take 1 tablet (4 mg total) by mouth as needed for muscle spasms., Disp: 30 tablet, Rfl: 0  ? ?Objective:   ?  ?Vitals:  ? 08/31/21 1537  ?BP: 132/80  ?Pulse: 66  ?SpO2: 99%  ?Weight: 166 lb (75.3 kg)  ?Height: 5\' 7"  (1.702 m)  ?  ?  ?Body mass index is 26 kg/m?.  ?  ?Physical Exam:   ? ?General: awake, alert, and oriented no acute distress, nontoxic ?Skin: no suspicious lesions or rashes ?Neuro:sensation intact distally with no dificits, normal muscle tone, no atrophy, strength 5/5 in all tested lower ext groups ?Psych: normal mood and affect, speech clear ? ?Left hip: ?No deformity, swelling or wasting ?ROM Flexion 90, ext 30, IR 45, ER 45 ?NTTP over the hip flexors, greater troch, glute musculature, si joint, lumbar spine ?Negative log roll with FROM ?Negative FABER ?Negative FADIR ?Negative Piriformis test ?Negative trendelenberg ?Gait normal  ? ? ?Electronically signed by:  ? [name] ?[facility] ?4:08 PM 08/31/21 ?

## 2021-08-31 ENCOUNTER — Ambulatory Visit (INDEPENDENT_AMBULATORY_CARE_PROVIDER_SITE_OTHER): Payer: 59 | Admitting: Sports Medicine

## 2021-08-31 VITALS — BP 132/80 | HR 66 | Ht 67.0 in | Wt 166.0 lb

## 2021-08-31 DIAGNOSIS — M5442 Lumbago with sciatica, left side: Secondary | ICD-10-CM

## 2021-08-31 DIAGNOSIS — G8929 Other chronic pain: Secondary | ICD-10-CM | POA: Diagnosis not present

## 2021-08-31 DIAGNOSIS — M5386 Other specified dorsopathies, lumbar region: Secondary | ICD-10-CM

## 2021-08-31 MED ORDER — TIZANIDINE HCL 4 MG PO TABS: 4.0000 mg | ORAL_TABLET | ORAL | 0 refills | Status: DC | PRN

## 2021-08-31 NOTE — Patient Instructions (Addendum)
Good to see you  ?Tizanidine 4 mg 1-2 tablets as needed  ?4-6 week follow up  ?

## 2021-09-15 ENCOUNTER — Ambulatory Visit (INDEPENDENT_AMBULATORY_CARE_PROVIDER_SITE_OTHER): Payer: 59 | Admitting: Family Medicine

## 2021-09-15 ENCOUNTER — Encounter: Payer: Self-pay | Admitting: Family Medicine

## 2021-09-15 VITALS — BP 130/86 | HR 80 | Temp 98.0°F | Ht 67.0 in | Wt 169.4 lb

## 2021-09-15 DIAGNOSIS — M545 Low back pain, unspecified: Secondary | ICD-10-CM | POA: Diagnosis not present

## 2021-09-15 LAB — POCT URINALYSIS DIPSTICK
Bilirubin, UA: NEGATIVE
Blood, UA: NEGATIVE
Glucose, UA: NEGATIVE
Ketones, UA: NEGATIVE
Nitrite, UA: NEGATIVE
Protein, UA: NEGATIVE
Spec Grav, UA: 1.025 (ref 1.010–1.025)
Urobilinogen, UA: 0.2 E.U./dL
pH, UA: 6.5 (ref 5.0–8.0)

## 2021-09-15 MED ORDER — KETOROLAC TROMETHAMINE 30 MG/ML IJ SOLN
30.0000 mg | Freq: Once | INTRAMUSCULAR | Status: AC
  Administered 2021-09-15: 30 mg via INTRAMUSCULAR

## 2021-09-15 NOTE — Patient Instructions (Signed)
I recommend that you use a heating pad or ice pack. ? ?I also recommend trying over-the-counter topical pain medicine or patches with lidocaine. ? ?Try adding Tylenol 500 mg every 4-6 hours for pain. ? ?Avoid ibuprofen or Advil this evening since we gave you an injection of Toradol in the office. ? ?Follow-up with Dr. Jean Rosenthal and sports medicine. ?

## 2021-09-15 NOTE — Progress Notes (Signed)
? ?Subjective:  ? ? Patient ID: [name], female    DOB: [birth year], 51 y.o.   MRN: [id number] ? ?HPI ?Chief Complaint  ?Patient presents with  ? Back Pain  ?  x2 weeks lower back pain all day that worsens when coughing. Cannot walk, stand, sit or lay down without pain.  ? ?Here with complains of bilateral low back pain x 1 week. No known injury. History of low back pain and sciatica.  ? ?Pain is non radiating and worse with certain movements such as going from sitting to standing, laying down and bending over.  ? ?Urinary frequency x 2 days. No other urinary symptoms.  ? ?No saddle anesthesia. No loss of control of bowels or bladder.  ? ?She recently saw Dr. Jean Rosenthal, sports medicine, for back pain.  ? ?Taking 400 mg of ibuprofen every 4-6 hours, using heating pad with minimal relief. Icy hot pads helps some.  ? ?She has been taking Zanaflex but states it is not helping.  ? ?Has been doing PT and does not think it is helping.  ? ?Denies fever, chills, dizziness, chest pain, palpitations, shortness of breath, abdominal pain, N/V/D.  ? ?Past Medical History:  ?Diagnosis Date  ? Gout   ? Hypertension   ? Migraines   ? ?Current Outpatient Medications on File Prior to Visit  ?Medication Sig Dispense Refill  ? amLODipine (NORVASC) 10 MG tablet TAKE 1 TABLET(10 MG) BY MOUTH DAILY 90 tablet 1  ? RESTASIS 0.05 % ophthalmic emulsion 1 drop 2 (two) times daily.    ? tiZANidine (ZANAFLEX) 4 MG tablet Take 1 tablet (4 mg total) by mouth as needed for muscle spasms. 30 tablet 0  ? topiramate (TOPAMAX) 25 MG tablet Take 1 tablet (25 mg total) by mouth 2 (two) times daily. 180 tablet 3  ? tiZANidine (ZANAFLEX) 4 MG tablet Take 1 tablet (4 mg total) by mouth at bedtime. (Patient not taking: Reported on 08/04/2021) 30 tablet 0  ? ?No current facility-administered medications on file prior to visit.  ? ?  ? ? ? ?Review of Systems ?Pertinent positives and negatives in the history of present illness. ? ?   ?Objective:  ?  Physical Exam ?Constitutional:   ?   General: She is not in acute distress. ?   Appearance: Normal appearance.  ?Cardiovascular:  ?   Rate and Rhythm: Normal rate.  ?   Pulses: Normal pulses.  ?Pulmonary:  ?   Effort: Pulmonary effort is normal.  ?   Breath sounds: Normal breath sounds.  ?Musculoskeletal:  ?   Cervical back: Normal, normal range of motion and neck supple.  ?   Thoracic back: Normal.  ?   Lumbar back: Tenderness present. Decreased range of motion. Negative right straight leg raise test and negative left straight leg raise test.  ?   Comments: Lumbar paraspinal muscle tenderness to palpation as well as tenderness over piriformis on left. Limited ROM due to pain  ?Skin: ?   General: Skin is warm and dry.  ?Neurological:  ?   General: No focal deficit present.  ?   Mental Status: She is alert.  ?   Cranial Nerves: No cranial nerve deficit.  ? ?BP 130/86 (BP Location: Right Arm, Patient Position: Sitting, Cuff Size: Large)   Pulse 80   Temp 98 ?F (36.7 ?C) (Temporal)   Ht 5\' 7"  (1.702 m)   Wt 169 lb 6.4 oz (76.8 kg)   SpO2 98%   BMI 26.53  kg/m?  ? ? ? ? ?   ?Assessment & Plan:  ?Acute bilateral low back pain without sciatica - Plan: POCT urinalysis dipstick, ketorolac (TORADOL) 30 MG/ML injection 30 mg ? ?UA negative for blood or infection.  ?No red flag symptoms. She has a history of lumbar spine pain and has been seeing sports medicine.  ?Toradol 30 mg IM given in office.  ?Encouraged her to try Tylenol, she may continue Zanaflex, heat or ice, topical analgesic and follow up with Dr. Jean Rosenthal.  ? ?

## 2021-09-20 ENCOUNTER — Emergency Department (HOSPITAL_COMMUNITY): Payer: 59

## 2021-09-20 ENCOUNTER — Ambulatory Visit: Payer: 59 | Admitting: Sports Medicine

## 2021-09-20 ENCOUNTER — Emergency Department (HOSPITAL_COMMUNITY)
Admission: EM | Admit: 2021-09-20 | Discharge: 2021-09-20 | Disposition: A | Discharge status: Home / Self Care | Payer: 59 | Attending: Emergency Medicine | Admitting: Emergency Medicine

## 2021-09-20 ENCOUNTER — Telehealth: Payer: Self-pay | Admitting: Sports Medicine

## 2021-09-20 ENCOUNTER — Encounter (HOSPITAL_COMMUNITY): Payer: Self-pay

## 2021-09-20 ENCOUNTER — Other Ambulatory Visit: Payer: Self-pay

## 2021-09-20 VITALS — BP 130/86 | HR 91 | Ht 67.0 in | Wt 167.0 lb

## 2021-09-20 DIAGNOSIS — I10 Essential (primary) hypertension: Secondary | ICD-10-CM | POA: Diagnosis not present

## 2021-09-20 DIAGNOSIS — M5442 Lumbago with sciatica, left side: Secondary | ICD-10-CM

## 2021-09-20 DIAGNOSIS — Z79899 Other long term (current) drug therapy: Secondary | ICD-10-CM | POA: Diagnosis not present

## 2021-09-20 DIAGNOSIS — M5137 Other intervertebral disc degeneration, lumbosacral region: Secondary | ICD-10-CM | POA: Insufficient documentation

## 2021-09-20 DIAGNOSIS — G8929 Other chronic pain: Secondary | ICD-10-CM

## 2021-09-20 DIAGNOSIS — M545 Low back pain, unspecified: Secondary | ICD-10-CM | POA: Diagnosis present

## 2021-09-20 LAB — URINALYSIS, ROUTINE W REFLEX MICROSCOPIC
Bilirubin Urine: NEGATIVE
Glucose, UA: NEGATIVE mg/dL
Hgb urine dipstick: NEGATIVE
Ketones, ur: NEGATIVE mg/dL
Leukocytes,Ua: NEGATIVE
Nitrite: NEGATIVE
Protein, ur: NEGATIVE mg/dL
Specific Gravity, Urine: 1.013 (ref 1.005–1.030)
pH: 5 (ref 5.0–8.0)

## 2021-09-20 MED ORDER — KETOROLAC TROMETHAMINE 60 MG/2ML IM SOLN
60.0000 mg | Freq: Once | INTRAMUSCULAR | Status: AC
  Administered 2021-09-20: 60 mg via INTRAMUSCULAR
  Filled 2021-09-20: qty 2

## 2021-09-20 MED ORDER — OXYCODONE-ACETAMINOPHEN 7.5-325 MG PO TABS
1.0000 | ORAL_TABLET | Freq: Three times a day (TID) | ORAL | 0 refills | Status: AC | PRN
Start: 2021-09-20 — End: 2021-09-23

## 2021-09-20 MED ORDER — OXYCODONE HCL 5 MG PO TABS
5.0000 mg | ORAL_TABLET | Freq: Once | ORAL | Status: AC
  Administered 2021-09-20: 5 mg via ORAL
  Filled 2021-09-20: qty 1

## 2021-09-20 NOTE — Addendum Note (Signed)
Addended by: Evon Slack on: 09/20/2021 02:33 PM ? ? Modules accepted: Orders ? ?

## 2021-09-20 NOTE — ED Provider Triage Note (Signed)
Emergency Medicine Provider Triage Evaluation Note ? ?[name] , a 51 y.o. female  was evaluated in triage.  Pt complains of bilateral lower back pain, left worse than right.  Patient is tearful in triage.  States that she has been having low back pain that started about 2 weeks ago.  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  She denies any falls.  She was seen at her PCPs office and was told to take Tylenol and a muscle relaxer.  She stated that last night she was laying in bed and had to prop a pillow under her back to help with the pain.  She took 2 low muscle relaxers which did not help her pain.  She denies any loss of bowel bladder control, no urinary symptoms, no numbness or tingling in her lower extremities, no foot drop ? ?Review of Systems  ?Positive: As above ?Negative: As above ? ?Physical Exam  ?BP (!) 140/97 (BP Location: Right Arm)   Pulse 96   Temp 97.8 ?F (36.6 ?C) (Oral)   Resp 16   Ht 5\' 7"  (1.702 m)   Wt 76 kg   SpO2 100%   BMI 26.24 kg/m?  ?Gen:   Awake, no distress   ?Resp:  Normal effort  ?MSK:   Moves extremities without difficulty  ?Other:  5/5 strength in upper and lower extremities bilaterally.  She has paraspinal muscle tenderness to the lumbar spine as well as midline tenderness to the lumbar spine, no step-offs or crepitus.  No significant CVA tenderness. ? ?Medical Decision Making  ?Medically screening exam initiated at 5:58 AM.  Appropriate orders placed.  Mccord was informed that the remainder of the evaluation will be completed by another provider, this initial triage assessment does not replace that evaluation, and the importance of remaining in the ED until their evaluation is complete. ? ? ?  ?Katherine Perches, PA-C ?09/20/21 0600 ? ?

## 2021-09-20 NOTE — Discharge Instructions (Signed)
You were evaluated for low back pain today.  ? ?Your x-rays did not show any fractures.  ? ?A CT scan was completed and showed degenerative disc between L5 and S1.  ? ?Please schedule follow up with your PCP and sports medicine physicians for continued work up of this problem.  ? ?We have provided a prescription for pain medication for the next few days.  ?

## 2021-09-20 NOTE — Telephone Encounter (Signed)
Seen today. 

## 2021-09-20 NOTE — Telephone Encounter (Signed)
Pt back pain is worse, went to ED today. ? ?Pt had xray and CT today, would like Dr. Glennon Mac to review and advise. She has follow up with him on 5/4 but can move up appt if needed. ?

## 2021-09-20 NOTE — Patient Instructions (Addendum)
Good to see you  ?Mri referral  ?Dr . Glennon Mac will call and contact you with results ?

## 2021-09-20 NOTE — ED Notes (Signed)
Patient transported to CT 

## 2021-09-20 NOTE — ED Notes (Signed)
Back from CT, alert, NAD, calm, interactive, verbalizes no change in pain, still 7/10.  ?

## 2021-09-20 NOTE — ED Notes (Addendum)
Pt alert, NAD, calm, interactive, resps e/u, speaking in clear complete sentences. MD at [facility]. Pt mentions heavy lifting at [employer]. ?

## 2021-09-20 NOTE — ED Triage Notes (Signed)
Pt with right and left lower back pain. Started 2 weeks ago. Denies injury. Muscle relaxer does  not help. Able to move all extremities ?

## 2021-09-20 NOTE — Progress Notes (Signed)
? ? [name] [name] ?[facility] ?[address] [place] [address] ?Phone: [phone] ?  ?Assessment and Plan:   ?  ?1. Chronic left-sided low back pain with left-sided sciatica ?-Chronic with exacerbation, subsequent visit ?- Severe pain and low back pain for the past 2 days limiting patient's ability to walk, perform ADLs ?- Due to severity of pain, unremarkable lumbar x-ray and CT, failure to improve with >6 weeks of conservative therapy, we will proceed with lumbar MRI for further evaluation ?- Reviewed lumbar x-ray and lumbar CT with patient in room.  My interpretation: No acute fracture or vertebral collapse.  Mild degenerative changes most prominent at L4-L5 ?- May continue to use Percocet provided by ER at today's visit for pain relief until MRI results and we can make a further treatment plan ?  ?Pertinent previous records reviewed include ER notes 09/20/2021, lumbar x-ray 09/20/2021, CT lumbar spine 09/20/2021 ?  ?Follow Up: I will call patient with MRI results and discuss further treatment plan ?  ?Subjective:   ?I, [name], am serving as a Neurosurgeon for Doctor [employer] ?  ?Chief Complaint: low back pain  ?  ?HPI:  ?06/17/2021 ?Patient is is 51 year old female complaining of left sided low back pain. Patient states that she has had 1 week of severe left low back pain that has progressed over the last day to include weakness and severe pain down the left leg with new onset numbness and tingling as well as weakness radiating into the L foot.  PCP who prescribed Flexeril and meloxicam with have not helped alleviate her pain.  She is also tried [place] powder, ice and heat which did not help. No MOI No shots no surgery conservative treatment preferred  ?  ?07/15/2021 ?Patient states that she would like a referral to the [facility] , pain in the inguinal area that wraps from the low back pain when she stands and puts pressure on it, received a doctors note  from dr gray so she can sit and stand , HEP has been doing them causes pain sometimes, Monday night was the worst pain, would like to discuss MRI  ?  ?08/08/2021 ?Patient states that she's working it out , still having flare ups  ?  ?08/31/2021 ?Patient states everything is good,  it hurts when she over works herself and it only hurts on her left hip and she can feel it coming on stopped going to Pt cause it wasn't doing anything wants her mg in muscle relaxer upped  ?  ?09/20/2021 ?Patient states that the pain has gotten worse wen to ER today an was given oxycodone- acetaminophen, pain is the same in the low back down to her butt and wraps around her groin area, today is a bad flare has been like this for the last 2 days went to PCP and was given a shot in her booty  ? ? ?Relevant Historical Information: Hypertension, OA right knee ? ?Additional pertinent review of systems negative. ? ? ?Current Outpatient Medications:  ?  amLODipine (NORVASC) 10 MG tablet, TAKE 1 TABLET(10 MG) BY MOUTH DAILY, Disp: 90 tablet, Rfl: 1 ?  oxyCODONE-acetaminophen (PERCOCET) 7.5-325 MG tablet, Take 1 tablet by mouth every 8 (eight) hours as needed for up to 3 days for severe pain., Disp: 9 tablet, Rfl: 0 ?  RESTASIS 0.05 % ophthalmic emulsion, 1 drop 2 (two) times daily., Disp: , Rfl:  ?  tiZANidine (ZANAFLEX) 4 MG tablet,  Take 1 tablet (4 mg total) by mouth at bedtime. (Patient not taking: Reported on 08/04/2021), Disp: 30 tablet, Rfl: 0 ?  tiZANidine (ZANAFLEX) 4 MG tablet, Take 1 tablet (4 mg total) by mouth as needed for muscle spasms., Disp: 30 tablet, Rfl: 0 ?  topiramate (TOPAMAX) 25 MG tablet, Take 1 tablet (25 mg total) by mouth 2 (two) times daily., Disp: 180 tablet, Rfl: 3  ? ?Objective:   ?  ?Vitals:  ? 09/20/21 1317  ?BP: 130/86  ?Pulse: 91  ?SpO2: 99%  ?Weight: 167 lb (75.8 kg)  ?Height: 5\' 7"  (1.702 m)  ?  ?  ?Body mass index is 26.16 kg/m?.  ?  ?Physical Exam:   ? ?Gen: Appears well, nontoxic and pleasant.  Hunched over and  moderate discomfort from low back ?Psych: Alert and oriented, appropriate mood and affect ?Neuro: sensation intact, strength is 5/5 in upper and lower extremities, muscle tone wnl ?Skin: no susupicious lesions or rashes ? ?Back - Normal skin, Spine with normal alignment and no deformity.   ?No tenderness to vertebral process palpation.   ?Paraspinous muscles are moderately tender on right and severely tender on left and with  spasm ?Straight leg raise positive on left ?Gait abnormal, walking slowly with shortened steps ? ? ?Electronically signed by:  ? [name] ?[facility] ?1:31 PM 09/20/21 ?

## 2021-09-20 NOTE — ED Provider Notes (Signed)
?[facility] EMERGENCY DEPARTMENT ?Provider Note ? ? ?CSN: [id number] ?Arrival date & time: 09/20/21  0536 ? ?  ? ?History ? ?Chief Complaint  ?Patient presents with  ? Back Pain  ? ? ?[name] is a 51 y.o. female. ? ?Patient has hx of scoliosis, HTN, gout, lumbar strain and patellofemoral arthritis. She presents with 2 weeks of worsening chronic lumbar back pain that is worse on the left side. She reports trying soaking in epsom salt, applying heat therapy, icy hot application, tizanidine and PT. She follows with sports medicine for this issue. She works as a Conservation officer, nature at [employer] and spends several hours standing. Pain involves her groin area, buttock and upper portion of her posterior thigh. She reports constant pain that hurts with ambulation, sitting, flexion of the back or lying flat in bed. She denies bowel or bladder incontinence. Hx is notable for CIN III in 2008 followed by a  hysterectomy for report of uterine fibroids, per patient. She denies having colonoscopy or mammogram. She has family hx of breast cancer in her grandmother. She denies regular steroid use due to her HTN. She denies paresthesias or abnormal gait due to pain.    ? ? ?  ? ?Home Medications ?Prior to Admission medications   ?Medication Sig Start Date End Date Taking? Authorizing Provider  ?amLODipine (NORVASC) 10 MG tablet TAKE 1 TABLET(10 MG) BY MOUTH DAILY 08/08/21   Georgina Quint, MD  ?oxyCODONE-acetaminophen (PERCOCET) 7.5-325 MG tablet Take 1 tablet by mouth every 8 (eight) hours as needed for up to 3 days for severe pain. 09/20/21 09/23/21 Yes Simmons-Robinson, Aldon Hengst, MD  ?RESTASIS 0.05 % ophthalmic emulsion 1 drop 2 (two) times daily. 11/24/19   [provider]  ?tiZANidine (ZANAFLEX) 4 MG tablet Take 1 tablet (4 mg total) by mouth at bedtime. ?Patient not taking: Reported on 08/04/2021 06/17/21   Richardean Sale, DO  ?tiZANidine (ZANAFLEX) 4 MG tablet Take 1 tablet (4 mg total) by mouth as  needed for muscle spasms. 08/31/21   Richardean Sale, DO  ?topiramate (TOPAMAX) 25 MG tablet Take 1 tablet (25 mg total) by mouth 2 (two) times daily. 11/03/20   Georgina Quint, MD  ?   ? ?Allergies    ?Xanax [alprazolam]   ? ?Review of Systems   ?Review of Systems  ?Constitutional:  Negative for chills and fever.  ?Respiratory:  Negative for shortness of breath.   ?Gastrointestinal:  Negative for abdominal pain and nausea.  ?Genitourinary:  Positive for frequency. Negative for dysuria.  ?Musculoskeletal:  Positive for arthralgias and back pain. Negative for joint swelling and neck pain.  ?Skin:  Negative for rash and wound.  ?Neurological:  Negative for weakness and headaches.  ? ?Physical Exam ?Updated Vital Signs ?BP 125/72 (BP Location: Right Arm)   Pulse 66   Temp 97.7 ?F (36.5 ?C) (Oral)   Resp 16   Ht 5\' 7"  (1.702 m)   Wt 76 kg   SpO2 100%   BMI 26.24 kg/m?  ?Physical Exam ?Constitutional:   ?   Appearance: Normal appearance. She is not ill-appearing.  ?Cardiovascular:  ?   Rate and Rhythm: Normal rate and regular rhythm.  ?Pulmonary:  ?   Effort: Pulmonary effort is normal.  ?Abdominal:  ?   Palpations: Abdomen is soft.  ?   Tenderness: There is no abdominal tenderness.  ?Musculoskeletal:  ?   Lumbar back: Tenderness present. No swelling, deformity, signs of trauma, lacerations or spasms. Normal range of motion.  Positive left straight leg raise test.  ?   Comments: 5/5 abductor strength bilaterally  ?2+posterior tibialis pulses  ?  ?Neurological:  ?   Mental Status: She is alert.  ? ? ?ED Results / Procedures / Treatments   ?Labs ?(all labs ordered are listed, but only abnormal results are displayed) ?Labs Reviewed  ?URINALYSIS, ROUTINE W REFLEX MICROSCOPIC - Abnormal; Notable for the following components:  ?    Result Value  ? Color, Urine STRAW (*)   ? All other components within normal limits  ? ? ?EKG ?None ? ?Radiology ?DG Lumbar Spine Complete ? ?Result Date: 09/20/2021 ?CLINICAL DATA:  Low  back pain for 2 weeks. EXAM: LUMBAR SPINE - COMPLETE 4+ VIEW COMPARISON:  None. FINDINGS: There is no evidence of lumbar spine fracture. Alignment is normal. Intervertebral disc spaces are maintained. IMPRESSION: Negative. Electronically Signed   By: Kennith Center M.D.   On: 09/20/2021 07:17  ? ?CT Lumbar Spine Wo Contrast ? ?Result Date: 09/20/2021 ?CLINICAL DATA:  Lumbar radiculopathy.  Low back pain EXAM: CT LUMBAR SPINE WITHOUT CONTRAST TECHNIQUE: Multidetector CT imaging of the lumbar spine was performed without intravenous contrast administration. Multiplanar CT image reconstructions were also generated. RADIATION DOSE REDUCTION: This exam was performed according to the departmental dose-optimization program which includes automated exposure control, adjustment of the mA and/or kV according to patient size and/or use of iterative reconstruction technique. COMPARISON:  Lumbar MRI 02/08/2017 FINDINGS: Segmentation: 5 lumbar type vertebrae Alignment: Normal Vertebrae: No acute fracture or focal pathologic process. Paraspinal and other soft tissues: Negative for perispinal mass or inflammation Disc levels: Focal L5-S1 disc narrowing and mild bulging, similar to prior MRI. No visible herniation or impingement IMPRESSION: Focal L5-S1 disc degeneration, also seen by MRI in 2018. No acute finding or visible impingement. Electronically Signed   By: Tiburcio Pea M.D.   On: 09/20/2021 10:24   ? ?Procedures ?Procedures  ? ?Medications Ordered in ED ?Medications  ?ketorolac (TORADOL) injection 60 mg (60 mg Intramuscular Given 09/20/21 0907)  ?oxyCODONE (Oxy IR/ROXICODONE) immediate release tablet 5 mg (5 mg Oral Given 09/20/21 1055)  ? ? ?ED Course/ Medical Decision Making/ A&P ?  ?                        ?Medical Decision Making ?Patient is 51 year old female presenting with progressively worsening lower back pain. Chart review shows frequent visits for this back pain that has not bene repsonsive to muscle relaxers,  NSAIDS, topical therapies or physical therapy. She has no neurological deficits  on exam and demonstrates normal ROM of the left lower extremity in the setting of no fever. Patient denies presence of urinary or bowel incontinence, denies saddle anesthesia, denies chronic steroid use however noted to have CIN III in 2008 (since had hysterectomy). Family hx includes a grandmother with breast cancer. Given the prolonged symptoms of progressive back pain in setting of negative xray for acute fracture, age and hx of CIN III, will order lumbar CT without contrast.  ?CT results showed L5-S1 degenerative disc disease. Discussed results with patient and recommendation for continued outpatient evaluation. Will administer additional analgesia with morphine IM dose, 4mg . Patient reports pain improved from 10/10 to 7/10 now. Will reassess after additional pain medication and plan for discharge home with outpatient follow up.  ?Patient reports improved pain. Will discharge with 3 days supply of pain medication and note to return to work 4.27.23 with recommendation for follow up with PCP  and sports medicine.  ? ?Amount and/or Complexity of Data Reviewed ?Radiology: ordered. ? ?Risk ?Prescription drug management. ? ? ? ?Final Clinical Impression(s) / ED Diagnoses ?Final diagnoses:  ?Degenerative disc disease at L5-S1 level  ? ? ?Rx / DC Orders ?ED Discharge Orders   ? ?      Ordered  ?  oxyCODONE-acetaminophen (PERCOCET) 7.5-325 MG tablet  Every 8 hours PRN       ? 09/20/21 1115  ? ?  ?  ? ?  ? ? ?  ?Ronnald RampSimmons-Robinson, Alica Shellhammer, MD ?09/20/21 1117 ? ?  ?Jacalyn LefevreHaviland, Julie, MD ?09/20/21 1344 ? ?

## 2021-09-24 ENCOUNTER — Ambulatory Visit (INDEPENDENT_AMBULATORY_CARE_PROVIDER_SITE_OTHER): Payer: 59

## 2021-09-24 DIAGNOSIS — M5442 Lumbago with sciatica, left side: Secondary | ICD-10-CM | POA: Diagnosis not present

## 2021-09-24 DIAGNOSIS — G8929 Other chronic pain: Secondary | ICD-10-CM

## 2021-09-29 ENCOUNTER — Ambulatory Visit: Payer: 59 | Admitting: Sports Medicine

## 2021-09-30 ENCOUNTER — Encounter: Payer: Self-pay | Admitting: Nurse Practitioner

## 2021-09-30 ENCOUNTER — Ambulatory Visit (INDEPENDENT_AMBULATORY_CARE_PROVIDER_SITE_OTHER): Payer: 59 | Admitting: Nurse Practitioner

## 2021-09-30 VITALS — BP 122/78 | HR 71 | Temp 97.7°F | Ht 67.0 in | Wt 168.0 lb

## 2021-09-30 DIAGNOSIS — G43919 Migraine, unspecified, intractable, without status migrainosus: Secondary | ICD-10-CM | POA: Diagnosis not present

## 2021-09-30 DIAGNOSIS — R7303 Prediabetes: Secondary | ICD-10-CM | POA: Insufficient documentation

## 2021-09-30 DIAGNOSIS — I10 Essential (primary) hypertension: Secondary | ICD-10-CM

## 2021-09-30 DIAGNOSIS — Z1231 Encounter for screening mammogram for malignant neoplasm of breast: Secondary | ICD-10-CM | POA: Insufficient documentation

## 2021-09-30 DIAGNOSIS — M5432 Sciatica, left side: Secondary | ICD-10-CM | POA: Insufficient documentation

## 2021-09-30 DIAGNOSIS — R0683 Snoring: Secondary | ICD-10-CM | POA: Insufficient documentation

## 2021-09-30 DIAGNOSIS — R400 Somnolence: Secondary | ICD-10-CM | POA: Diagnosis not present

## 2021-09-30 DIAGNOSIS — K047 Periapical abscess without sinus: Secondary | ICD-10-CM

## 2021-09-30 DIAGNOSIS — E785 Hyperlipidemia, unspecified: Secondary | ICD-10-CM | POA: Insufficient documentation

## 2021-09-30 MED ORDER — TOPIRAMATE 25 MG PO TABS: 25.0000 mg | ORAL_TABLET | Freq: Two times a day (BID) | ORAL | 3 refills | Status: DC

## 2021-09-30 MED ORDER — METFORMIN HCL 500 MG PO TABS: 500.0000 mg | ORAL_TABLET | Freq: Every day | ORAL | 2 refills | Status: DC

## 2021-09-30 MED ORDER — AMOXICILLIN 500 MG PO TABS: ORAL_TABLET | ORAL | 0 refills | Status: DC

## 2021-09-30 MED ORDER — AMLODIPINE BESYLATE 10 MG PO TABS: ORAL_TABLET | ORAL | 3 refills | Status: DC

## 2021-09-30 NOTE — Progress Notes (Signed)
? ?Established Patient Office Visit ? ?Subjective   ?Patient ID: [name], female    DOB: 09/10/1970  Age: 51 y.o. MRN: [id number] ? ?Chief Complaint  ?Patient presents with  ? Transitions Of Care  ? ? ?Patient arrives today for the above.  She like to transition to me as her primary care provider.  Her main concerns are listed below. ? ?Low back pain: She has chronic low back pain which causes her to difficulty to function at her job.  She works for [employer] and has to be on her feet for long periods at a time.  She often will have to leave week early due to pain.  She has tried over-the-counter medication like Tylenol and ibuprofen.  She is also tried over-the-counter lidocaine pain patch.  She has tried prescription muscle relaxers.  She is being evaluated by sports medicine.  She did undergo MRI of the back and is scheduled to follow-up with her sports medicine doctor in the next week or 2.  MRI of the back does show mild disc degeneration from L5-S1 without spinal canal stenosis or neural foraminal narrowing. ? ?Tooth pain: She also reports that she has pain to a molar up on her upper left side.  She has not seen a dentist. ? ?Snoring/daytime sleepiness: She reports she is frequently tired during the day and she will wake up at night feeling short of breath.  She also believes that she snores as she hears herself snoring through her sleep.  She wakes up with a dry mouth.  She denies ever being evaluated for sleep apnea.  She does not have a sleep partner and therefore has not had any witnessed apneic episodes. ? ?Hypertension: She continues on amlodipine 10 mg by mouth daily for hypertension.  She is tolerating this well and would like a refill today. ? ?Migraines: She is on topiramate for migraine prophylaxis.  She is requesting refill today. ? ?Prediabetes: Blood work collected about 3 months ago showed A1c of 5.8. ? ?Hyperlipidemia: Last lipid panel collected approximately 3 months ago which showed  total of 227, HDL 54, and LDL of 151.  ASCVD risk score approximately 6%. ? ?Healthcare maintenance: She is due for breast cancer screening as well as colon cancer screening.  She is now agreeable to undergo colon cancer screening at this time but would be agreeable for screening mammogram. ? ? ? ? ? ? ?Review of Systems  ?Constitutional:  Positive for malaise/fatigue.  ?Respiratory:  Negative for shortness of breath.   ?Cardiovascular:  Negative for chest pain.  ? ?  ?Objective:  ?  ? ?BP 122/78 (BP Location: Left Arm, Patient Position: Sitting, Cuff Size: Normal)   Pulse 71   Temp 97.7 ?F (36.5 ?C) (Oral)   Ht 5\' 7"  (1.702 m)   Wt 168 lb (76.2 kg)   SpO2 97%   BMI 26.31 kg/m?  ?BP Readings from Last 3 Encounters:  ?09/30/21 122/78  ?09/20/21 130/86  ?09/20/21 125/72  ? ?Wt Readings from Last 3 Encounters:  ?09/30/21 168 lb (76.2 kg)  ?09/20/21 167 lb (75.8 kg)  ?09/20/21 167 lb 8.8 oz (76 kg)  ? ?  ? ?Physical Exam ?Vitals reviewed.  ?Constitutional:   ?   General: She is not in acute distress. ?   Appearance: Normal appearance.  ?HENT:  ?   Head: Normocephalic and atraumatic.  ?   Mouth/Throat:  ? ?Neck:  ?   Vascular: No carotid bruit.  ?Cardiovascular:  ?  Rate and Rhythm: Normal rate and regular rhythm.  ?   Pulses: Normal pulses.  ?   Heart sounds: Normal heart sounds.  ?Pulmonary:  ?   Effort: Pulmonary effort is normal.  ?   Breath sounds: Normal breath sounds.  ?Musculoskeletal:  ?   Lumbar back: Tenderness present.  ?Skin: ?   General: Skin is warm and dry.  ?Neurological:  ?   General: No focal deficit present.  ?   Mental Status: She is alert and oriented to person, place, and time.  ?Psychiatric:     ?   Mood and Affect: Mood normal.     ?   Behavior: Behavior normal.     ?   Judgment: Judgment normal.  ? ? ? ?No results found for any visits on 09/30/21. ? ? ? ?The 10-year ASCVD risk score (Arnett DK, et al., 2019) is: 6.4% ? ?  ?Assessment & Plan:  ? ?Problem List Items Addressed This Visit    ? ?  ? Cardiovascular and Mediastinum  ? Essential hypertension  ? Relevant Medications  ? amLODipine (NORVASC) 10 MG tablet  ? ?Other Visit Diagnoses   ? ? Snoring    -  Primary  ? Relevant Orders  ? Ambulatory referral to Neurology  ? Daytime sleepiness      ? Relevant Orders  ? Ambulatory referral to Neurology  ? Intractable migraine without status migrainosus, unspecified migraine type      ? Relevant Medications  ? amLODipine (NORVASC) 10 MG tablet  ? topiramate (TOPAMAX) 25 MG tablet  ? Prediabetes      ? Relevant Medications  ? metFORMIN (GLUCOPHAGE) 500 MG tablet  ? Tooth abscess      ? Relevant Medications  ? amoxicillin (AMOXIL) 500 MG tablet  ? Chronic sciatica of left side      ? Relevant Medications  ? topiramate (TOPAMAX) 25 MG tablet  ? Other Relevant Orders  ? Ambulatory referral to Pain Clinic  ? Encounter for screening mammogram for malignant neoplasm of breast      ? Relevant Orders  ? MM Digital Screening  ? ?  ? ? ?Return in about 3 months (around 12/31/2021) for Annual exam with Maralyn Sago.  ? ? ?Elenore Paddy, NP ? ?

## 2021-09-30 NOTE — Assessment & Plan Note (Signed)
She is encouraged to follow-up with her orthopedic doctor, will also we refer her to pain medicine clinic for assistance with chronic pain management. ?

## 2021-09-30 NOTE — Assessment & Plan Note (Signed)
Chronic, well controlled on topiramate.  She will continue on Topamax 25 mg twice a day ?

## 2021-09-30 NOTE — Assessment & Plan Note (Signed)
Chronic, well controlled.  She will continue on amlodipine 10 mg/day. ?

## 2021-09-30 NOTE — Assessment & Plan Note (Signed)
Lifestyle modification discussed today.  Recommend she try to follow the mediterranean diet. She is agreeable to consider this. Educational handout provided today.  ?

## 2021-09-30 NOTE — Assessment & Plan Note (Signed)
Discussed risk/benefit of pharmacologic treatment vs nonpharmacologic treatment. For now patient will consider lifestyle modification. Handout on mediterranean diet provided today, she was encouraged to consider following this eating plan.  ?

## 2021-09-30 NOTE — Patient Instructions (Signed)
Mediterranean Diet ?A Mediterranean diet refers to food and lifestyle choices that are based on the traditions of countries located on the [place] Sea. It focuses on eating more fruits, vegetables, whole grains, beans, nuts, seeds, and heart-healthy fats, and eating less dairy, meat, eggs, and processed foods with added sugar, salt, and fat. This way of eating has been shown to help prevent certain conditions and improve outcomes for people who have chronic diseases, like kidney disease and heart disease. ?What are tips for following this plan? ?Reading food labels ?Check the serving size of packaged foods. For foods such as rice and pasta, the serving size refers to the amount of cooked product, not dry. ?Check the total fat in packaged foods. Avoid foods that have saturated fat or trans fats. ?Check the ingredient list for added sugars, such as corn syrup. ?Shopping ? ?Buy a variety of foods that offer a balanced diet, including: ?Fresh fruits and vegetables (produce). ?Grains, beans, nuts, and seeds. Some of these may be available in unpackaged forms or large amounts (in bulk). ?Fresh seafood. ?Poultry and eggs. ?Low-fat dairy products. ?Buy whole ingredients instead of prepackaged foods. ?Buy fresh fruits and vegetables in-season from local farmers markets. ?Buy plain frozen fruits and vegetables. ?If you do not have access to quality fresh seafood, buy precooked frozen shrimp or canned fish, such as tuna, salmon, or sardines. ?Stock your pantry so you always have certain foods on hand, such as olive oil, canned tuna, canned tomatoes, rice, pasta, and beans. ?Cooking ?Cook foods with extra-virgin olive oil instead of using butter or other vegetable oils. ?Have meat as a side dish, and have vegetables or grains as your main dish. This means having meat in small portions or adding small amounts of meat to foods like pasta or stew. ?Use beans or vegetables instead of meat in common dishes like chili or  lasagna. ?Experiment with different cooking methods. Try roasting, broiling, steaming, and saut?ing vegetables. ?Add frozen vegetables to soups, stews, pasta, or rice. ?Add nuts or seeds for added healthy fats and plant protein at each meal. You can add these to yogurt, salads, or vegetable dishes. ?Marinate fish or vegetables using olive oil, lemon juice, garlic, and fresh herbs. ?Meal planning ?Plan to eat one vegetarian meal one day each week. Try to work up to two vegetarian meals, if possible. ?Eat seafood two or more times a week. ?Have healthy snacks readily available, such as: ?Vegetable sticks with hummus. ?Greek yogurt. ?Fruit and nut trail mix. ?Eat balanced meals throughout the week. This includes: ?Fruit: 2-3 servings a day. ?Vegetables: 4-5 servings a day. ?Low-fat dairy: 2 servings a day. ?Fish, poultry, or lean meat: 1 serving a day. ?Beans and legumes: 2 or more servings a week. ?Nuts and seeds: 1-2 servings a day. ?Whole grains: 6-8 servings a day. ?Extra-virgin olive oil: 3-4 servings a day. ?Limit red meat and sweets to only a few servings a month. ?Lifestyle ? ?Cook and eat meals together with your family, when possible. ?Drink enough fluid to keep your urine pale yellow. ?Be physically active every day. This includes: ?Aerobic exercise like running or swimming. ?Leisure activities like gardening, walking, or housework. ?Get 7-8 hours of sleep each night. ?If recommended by your health care provider, drink red wine in moderation. This means 1 glass a day for nonpregnant women and 2 glasses a day for men. A glass of wine equals 5 oz (150 mL). ?What foods should I eat? ?Fruits ?Apples. Apricots. Avocado. Berries. Bananas. Cherries. Dates.   Figs. Grapes. Lemons. Melon. Oranges. Peaches. Plums. Pomegranate. ?Vegetables ?Artichokes. Beets. Broccoli. Cabbage. Carrots. Eggplant. Green beans. Chard. Kale. Spinach. Onions. Leeks. Peas. Squash. Tomatoes. Peppers. Radishes. ?Grains ?Whole-grain pasta. Brown  rice. Bulgur wheat. Polenta. Couscous. Whole-wheat bread. Oatmeal. Quinoa. ?Meats and other proteins ?Beans. Almonds. Sunflower seeds. Pine nuts. Peanuts. Cod. Salmon. Scallops. Shrimp. Tuna. Tilapia. Clams. Oysters. Eggs. Poultry without skin. ?Dairy ?Low-fat milk. Cheese. Greek yogurt. ?Fats and oils ?Extra-virgin olive oil. Avocado oil. Grapeseed oil. ?Beverages ?Water. Red wine. Herbal tea. ?Sweets and desserts ?Greek yogurt with honey. Baked apples. Poached pears. Trail mix. ?Seasonings and condiments ?Basil. Cilantro. Coriander. Cumin. Mint. Parsley. Sage. Rosemary. Tarragon. Garlic. Oregano. Thyme. Pepper. Balsamic vinegar. Tahini. Hummus. Tomato sauce. Olives. Mushrooms. ?The items listed above may not be a complete list of foods and beverages you can eat. Contact a dietitian for more information. ?What foods should I limit? ?This is a list of foods that should be eaten rarely or only on special occasions. ?Fruits ?Fruit canned in syrup. ?Vegetables ?Deep-fried potatoes (french fries). ?Grains ?Prepackaged pasta or rice dishes. Prepackaged cereal with added sugar. Prepackaged snacks with added sugar. ?Meats and other proteins ?Beef. Pork. Lamb. Poultry with skin. Hot dogs. Bacon. ?Dairy ?Ice cream. Sour cream. Whole milk. ?Fats and oils ?Butter. Canola oil. Vegetable oil. Beef fat (tallow). Lard. ?Beverages ?Juice. Sugar-sweetened soft drinks. Beer. Liquor and spirits. ?Sweets and desserts ?Cookies. Cakes. Pies. Candy. ?Seasonings and condiments ?Mayonnaise. Pre-made sauces and marinades. ?The items listed above may not be a complete list of foods and beverages you should limit. Contact a dietitian for more information. ?Summary ?The Mediterranean diet includes both food and lifestyle choices. ?Eat a variety of fresh fruits and vegetables, beans, nuts, seeds, and whole grains. ?Limit the amount of red meat and sweets that you eat. ?If recommended by your health care provider, drink red wine in moderation.  This means 1 glass a day for nonpregnant women and 2 glasses a day for men. A glass of wine equals 5 oz (150 mL). ?This information is not intended to replace advice given to you by your health care provider. Make sure you discuss any questions you have with your health care provider. ?Document Revised: 06/20/2019 Document Reviewed: 04/17/2019 ?Elsevier Patient Education ? 2023 [employer] ? ?

## 2021-09-30 NOTE — Assessment & Plan Note (Signed)
Referral to neurology ordered today so she can undergo sleep study for evaluation of possible sleep apnea.  ?

## 2021-09-30 NOTE — Assessment & Plan Note (Signed)
Screening mammogram ordered today ?

## 2021-09-30 NOTE — Assessment & Plan Note (Signed)
We will refer to neurology to undergo sleep study for evaluation of possible sleep apnea. ?

## 2021-09-30 NOTE — Assessment & Plan Note (Signed)
Acute, possible early abscess to her upper gums.  Amoxicillin 1 g by mouth once followed by 500 mg by mouth 3 times a day for a total of 3 additional days prescribed today.  She was encouraged to follow-up with dentist. ?

## 2021-10-06 NOTE — Progress Notes (Signed)
? ? [name] D.Judd Gaudier ?[facility] ?[address] [place] [address] ?Phone: [phone] ?  ?Assessment and Plan:   ?  ?1. Chronic bilateral low back pain without sciatica ?-Chronic with exacerbation, subsequent visit ?- Unclear etiology of continued severe low back pain, currently without radicular symptoms, continuing to limit patient's ability to work, walk, perform IADLs.  I am not sure what could be causing this severe pain and patient.  Patient working at [employer] and standing on hard/concrete floors for 8 hours a day may be contributing to low back strains and muscle pains.  Recommended buying a cushion for mat that she can stand on while working.  Continue to use cushion shoes.  Patient has had no benefit with muscle relaxers, Tylenol, NSAIDs ?- Patient had a significant blood pressure increase response to a prednisone Dosepak in the past, so instead we will use prednisone 10 mg twice daily x10 days.  Instructed to discontinue use of prednisone if patient's systolic blood pressures should rise above 180 ?- Reviewed lumbar MRI with patient in room which showed only very mild disc herniation at L4-5 and L5-S1, unlikely to be significantly contributing to patient's degree of discomfort. ?- X-rays obtained in clinic.  My interpretation: SI joints are patent.  No abnormalities of femoral acetabular joints.  Unremarkable appearance of pelvis.  No acute fracture or dislocation ?- DG Pelvis 1-2 Views; Future ?- DG Si Joints; Future  ?  ?Pertinent previous records reviewed include none ?  ?Follow Up: 3 weeks for reevaluation.  Patient has been resistant to have injections in the past, however I am not sure if I have other treatment options besides experimental injections at this point.  Referral to another provider or to pain management would have to be considered ?  ?Subjective:   ?I, [name], am serving as a Neurosurgeon for Doctor [employer] ?  ?Chief Complaint: low back  pain  ?  ?HPI:  ?06/17/2021 ?Patient is is 51 year old female complaining of left sided low back pain. Patient states that she has had 1 week of severe left low back pain that has progressed over the last day to include weakness and severe pain down the left leg with new onset numbness and tingling as well as weakness radiating into the L foot.  PCP who prescribed Flexeril and meloxicam with have not helped alleviate her pain.  She is also tried [place] powder, ice and heat which did not help. No MOI No shots no surgery conservative treatment preferred  ?  ?07/15/2021 ?Patient states that she would like a referral to the good foot doctor , pain in the inguinal area that wraps from the low back pain when she stands and puts pressure on it, received a doctors note from dr gray so she can sit and stand , HEP has been doing them causes pain sometimes, Monday night was the worst pain, would like to discuss MRI  ?  ?08/08/2021 ?Patient states that she's working it out , still having flare ups  ?  ?08/31/2021 ?Patient states everything is good,  it hurts when she over works herself and it only hurts on her left hip and she can feel it coming on stopped going to Pt cause it wasn't doing anything wants her mg in muscle relaxer upped  ?  ?09/20/2021 ?Patient states that the pain has gotten worse wen to ER today an was given oxycodone- acetaminophen, pain is the same in the low back down to  her butt and wraps around her groin area, today is a bad flare has been like this for the last 2 days went to PCP and was given a shot in her booty  ? ?10/07/2021 ?Patient states she is still in pain doing the same  ?  ?  ?Relevant Historical Information: Hypertension, OA right knee ? ?Additional pertinent review of systems negative. ? ? ?Current Outpatient Medications:  ?  amLODipine (NORVASC) 10 MG tablet, TAKE 1 TABLET(10 MG) BY MOUTH DAILY, Disp: 90 tablet, Rfl: 3 ?  amoxicillin (AMOXIL) 500 MG tablet, Take 2 tablet by mouth on day 1, then take  1 tablet by mouth every 8 hours for 3 days., Disp: 11 tablet, Rfl: 0 ?  metFORMIN (GLUCOPHAGE) 500 MG tablet, Take 1 tablet (500 mg total) by mouth daily with breakfast., Disp: 90 tablet, Rfl: 2 ?  predniSONE (DELTASONE) 10 MG tablet, Take twice a day for the next 10 days, Disp: 20 tablet, Rfl: 0 ?  RESTASIS 0.05 % ophthalmic emulsion, 1 drop 2 (two) times daily., Disp: , Rfl:  ?  tiZANidine (ZANAFLEX) 4 MG tablet, Take 1 tablet (4 mg total) by mouth as needed for muscle spasms., Disp: 30 tablet, Rfl: 0 ?  topiramate (TOPAMAX) 25 MG tablet, Take 1 tablet (25 mg total) by mouth 2 (two) times daily., Disp: 180 tablet, Rfl: 3  ? ?Objective:   ?  ?Vitals:  ? 10/07/21 1102  ?BP: 134/80  ?Pulse: (!) 54  ?SpO2: 99%  ?Weight: 164 lb (74.4 kg)  ?Height: 5\' 7"  (1.702 m)  ?  ?  ?Body mass index is 25.69 kg/m?.  ?  ?Physical Exam:   ? ?Gen: Appears well, nad, nontoxic and pleasant ?Psych: Alert and oriented, appropriate mood and affect ?Neuro: sensation intact, strength is 5/5 in upper and lower extremities, muscle tone wnl ?Skin: no susupicious lesions or rashes ? ?Back - Normal skin, Spine with normal alignment and no deformity.   ?Mild tenderness to vertebral process palpation.   ?Paraspinous muscles are moderately tender and without spasm ?Moderate TTP bilateral SI ?Pain across low back with flexion, extension, lying supine ?Straight leg raise negative, though caused back pain bilaterally without radicular symptoms ?  ? ? ?Electronically signed by:  ? [name] ?[facility] ?11:55 AM 10/07/21 ?

## 2021-10-07 ENCOUNTER — Ambulatory Visit: Payer: 59 | Admitting: Sports Medicine

## 2021-10-07 ENCOUNTER — Ambulatory Visit (INDEPENDENT_AMBULATORY_CARE_PROVIDER_SITE_OTHER): Payer: 59

## 2021-10-07 VITALS — BP 134/80 | HR 54 | Ht 67.0 in | Wt 164.0 lb

## 2021-10-07 DIAGNOSIS — M5442 Lumbago with sciatica, left side: Secondary | ICD-10-CM | POA: Diagnosis not present

## 2021-10-07 DIAGNOSIS — G8929 Other chronic pain: Secondary | ICD-10-CM | POA: Diagnosis not present

## 2021-10-07 DIAGNOSIS — M7062 Trochanteric bursitis, left hip: Secondary | ICD-10-CM

## 2021-10-07 DIAGNOSIS — M545 Low back pain, unspecified: Secondary | ICD-10-CM

## 2021-10-07 DIAGNOSIS — M5386 Other specified dorsopathies, lumbar region: Secondary | ICD-10-CM

## 2021-10-07 MED ORDER — PREDNISONE 10 MG PO TABS: ORAL_TABLET | ORAL | 0 refills | Status: DC

## 2021-10-07 NOTE — Patient Instructions (Addendum)
Good to see you  ?Start prednisone pak 10 mg twice a day for 10 days   ?Recommend buying a standing mat for low back pain using it at work ?Stop Prednisone if blood pressure is above 180  ?3 week follow up   ? ?

## 2021-10-20 ENCOUNTER — Telehealth: Payer: Self-pay | Admitting: Nurse Practitioner

## 2021-10-20 NOTE — Telephone Encounter (Signed)
Pt called in and states referral was placed to [facility].   Unfortunately, they do not accept her insurance.   Please resend referral elsewhere.

## 2021-10-23 ENCOUNTER — Other Ambulatory Visit: Payer: Self-pay | Admitting: Sports Medicine

## 2021-10-25 ENCOUNTER — Telehealth: Payer: Self-pay | Admitting: Sports Medicine

## 2021-10-25 MED ORDER — TIZANIDINE HCL 4 MG PO TABS: 4.0000 mg | ORAL_TABLET | ORAL | 0 refills | Status: DC | PRN

## 2021-10-25 NOTE — Telephone Encounter (Signed)
Pt states she sent [place] a MyChart msg this weekend but I am unable to find.  Her pain is so bad on the left side that she is struggling to sit/stand/sleep.  Pt informed that Tizadine refilled.  Pt mentioned that PCP referred her to pain mgmt, still awaiting scheduling.  Unsure what next move is.

## 2021-10-26 NOTE — Progress Notes (Unsigned)
Aleen Sells D.[name] [address] [place] [address] Phone: [phone]   Assessment and Plan:    1. SI (sacroiliac) joint dysfunction 2. Chronic low back pain, unspecified back pain laterality, unspecified whether sciatica present 3. Somatic dysfunction of lumbar region 4. Somatic dysfunction of pelvic region 5. Somatic dysfunction of sacral region -Chronic with exacerbation, subsequent visit - Unclear etiology of continued severe low back pain, most prominent on the left lower back and over SI joint that is continuing to limit patient's ability to work, walk, perform IADLs.  Patient has an largely unremarkable x-ray, MRI, CT imaging within the past 6 months and has not had any benefit with home exercise program, prednisone, NSAIDs, muscle relaxer use - Patient had a bad experience with previous physical therapist and did not find that the therapy was effective.  We will refer to a different physical therapist to see if it is a better fit for patient.  Referral sent for PT for SI and low back - Referral placed at previous office visit to pain management as patient has not improved with multiple pain medications with our clinic - Patient elected for initial OMT today.  Tolerated well per note below. - Decision today to treat with OMT was based on Physical Exam  After verbal consent patient was treated with HVLA (high velocity low amplitude), ME (muscle energy), FPR (flex positional release), ST (soft tissue), PC/PD (Pelvic Compression/ Pelvic Decompression) techniques in sacral, lumbar, and pelvic areas. Patient tolerated the procedure well with improvement in symptoms.  Patient educated on potential side effects of soreness and recommended to rest, hydrate, and use Tylenol as needed for pain control.    Pertinent previous records reviewed include none   Follow Up: 1 week for left SI joint injection   Subjective:   I, Jasyah Theurer, am  serving as a Neurosurgeon for Doctor Richardean Sale   Chief Complaint: low back pain    HPI:  06/17/2021 Patient is is 51 year old female complaining of left sided low back pain. Patient states that she has had 1 week of severe left low back pain that has progressed over the last day to include weakness and severe pain down the left leg with new onset numbness and tingling as well as weakness radiating into the L foot.  PCP who prescribed Flexeril and meloxicam with have not helped alleviate her pain.  She is also tried [place] powder, ice and heat which did not help. No MOI No shots no surgery conservative treatment preferred    07/15/2021 Patient states that she would like a referral to the good foot doctor , pain in the inguinal area that wraps from the low back pain when she stands and puts pressure on it, received a doctors note from dr gray so she can sit and stand , HEP has been doing them causes pain sometimes, Monday night was the worst pain, would like to discuss MRI    08/08/2021 Patient states that she's working it out , still having flare ups    08/31/2021 Patient states everything is good,  it hurts when she over works herself and it only hurts on her left hip and she can feel it coming on stopped going to Pt cause it wasn't doing anything wants her mg in muscle relaxer upped    09/20/2021 Patient states that the pain has gotten worse wen to ER today an was given oxycodone- acetaminophen, pain is the same in the  low back down to her butt and wraps around her groin area, today is a bad flare has been like this for the last 2 days went to PCP and was given a shot in her booty    10/07/2021 Patient states she is still in pain doing the same   10/27/2021 Patient states still in a lot of pain, is unable to get comfy   in bed and has to wiggle her way to get in and out of bed     Relevant Historical Information: Hypertension, OA right knee  Additional pertinent review of systems  negative.   Current Outpatient Medications:    amLODipine (NORVASC) 10 MG tablet, TAKE 1 TABLET(10 MG) BY MOUTH DAILY, Disp: 90 tablet, Rfl: 3   amoxicillin (AMOXIL) 500 MG tablet, Take 2 tablet by mouth on day 1, then take 1 tablet by mouth every 8 hours for 3 days., Disp: 11 tablet, Rfl: 0   metFORMIN (GLUCOPHAGE) 500 MG tablet, Take 1 tablet (500 mg total) by mouth daily with breakfast., Disp: 90 tablet, Rfl: 2   predniSONE (DELTASONE) 10 MG tablet, Take twice a day for the next 10 days, Disp: 20 tablet, Rfl: 0   RESTASIS 0.05 % ophthalmic emulsion, 1 drop 2 (two) times daily., Disp: , Rfl:    topiramate (TOPAMAX) 25 MG tablet, Take 1 tablet (25 mg total) by mouth 2 (two) times daily., Disp: 180 tablet, Rfl: 3   tiZANidine (ZANAFLEX) 4 MG tablet, Take 1 tablet (4 mg total) by mouth in the morning and at bedtime., Disp: 30 tablet, Rfl: 0   Objective:     Vitals:   10/27/21 1431  BP: 120/86  Pulse: 83  SpO2: 97%  Weight: 171 lb (77.6 kg)  Height: 5\' 7"  (1.702 m)      Body mass index is 26.78 kg/m.    Physical Exam:    General: Well-appearing, cooperative, sitting comfortably in no acute distress.   OMT Physical Exam:  ASIS Compression Test: Positive left Sacrum: Positive sphinx, TTP bilateral sacral base, severe in the left, mild in right Lumbar: TTP paraspinal, restricted rotation and sidebending bilaterally with muscle spasms Pelvis: Left posterior innominate    Electronically signed by:  Benito Mccreedy D.[facility] 4:16 PM 10/27/21

## 2021-10-26 NOTE — Telephone Encounter (Signed)
Appt moved up to 10/27/2021.

## 2021-10-27 ENCOUNTER — Ambulatory Visit (INDEPENDENT_AMBULATORY_CARE_PROVIDER_SITE_OTHER): Payer: 59 | Admitting: Sports Medicine

## 2021-10-27 VITALS — BP 120/86 | HR 83 | Ht 67.0 in | Wt 171.0 lb

## 2021-10-27 DIAGNOSIS — M545 Low back pain, unspecified: Secondary | ICD-10-CM | POA: Diagnosis not present

## 2021-10-27 DIAGNOSIS — M533 Sacrococcygeal disorders, not elsewhere classified: Secondary | ICD-10-CM | POA: Diagnosis not present

## 2021-10-27 DIAGNOSIS — M9905 Segmental and somatic dysfunction of pelvic region: Secondary | ICD-10-CM | POA: Diagnosis not present

## 2021-10-27 DIAGNOSIS — M9903 Segmental and somatic dysfunction of lumbar region: Secondary | ICD-10-CM

## 2021-10-27 DIAGNOSIS — M9904 Segmental and somatic dysfunction of sacral region: Secondary | ICD-10-CM

## 2021-10-27 DIAGNOSIS — G8929 Other chronic pain: Secondary | ICD-10-CM

## 2021-10-27 MED ORDER — TIZANIDINE HCL 4 MG PO TABS: 4.0000 mg | ORAL_TABLET | Freq: Two times a day (BID) | ORAL | 0 refills | Status: DC

## 2021-10-27 NOTE — Patient Instructions (Addendum)
Good to see you  Pain management phone number [phone] Pt referral for SI and low back  1 week follow up for SI joint injection

## 2021-10-28 ENCOUNTER — Emergency Department (HOSPITAL_COMMUNITY)
Admission: EM | Admit: 2021-10-28 | Discharge: 2021-10-29 | Disposition: A | Discharge status: Home / Self Care | Payer: 59 | Attending: Emergency Medicine | Admitting: Emergency Medicine

## 2021-10-28 ENCOUNTER — Other Ambulatory Visit: Payer: Self-pay

## 2021-10-28 DIAGNOSIS — D72829 Elevated white blood cell count, unspecified: Secondary | ICD-10-CM | POA: Insufficient documentation

## 2021-10-28 DIAGNOSIS — M545 Low back pain, unspecified: Secondary | ICD-10-CM | POA: Diagnosis not present

## 2021-10-28 DIAGNOSIS — R109 Unspecified abdominal pain: Secondary | ICD-10-CM | POA: Diagnosis not present

## 2021-10-28 LAB — URINALYSIS, ROUTINE W REFLEX MICROSCOPIC
Bilirubin Urine: NEGATIVE
Glucose, UA: NEGATIVE mg/dL
Hgb urine dipstick: NEGATIVE
Ketones, ur: NEGATIVE mg/dL
Leukocytes,Ua: NEGATIVE
Nitrite: NEGATIVE
Protein, ur: NEGATIVE mg/dL
Specific Gravity, Urine: 1.004 — ABNORMAL LOW (ref 1.005–1.030)
pH: 8 (ref 5.0–8.0)

## 2021-10-28 LAB — CBC
HCT: 40.7 % (ref 36.0–46.0)
Hemoglobin: 13.3 g/dL (ref 12.0–15.0)
MCH: 28.8 pg (ref 26.0–34.0)
MCHC: 32.7 g/dL (ref 30.0–36.0)
MCV: 88.1 fL (ref 80.0–100.0)
Platelets: 443 10*3/uL — ABNORMAL HIGH (ref 150–400)
RBC: 4.62 MIL/uL (ref 3.87–5.11)
RDW: 12.8 % (ref 11.5–15.5)
WBC: 11.3 10*3/uL — ABNORMAL HIGH (ref 4.0–10.5)
nRBC: 0 % (ref 0.0–0.2)

## 2021-10-28 LAB — BASIC METABOLIC PANEL
Anion gap: 11 (ref 5–15)
BUN: 7 mg/dL (ref 6–20)
CO2: 20 mmol/L — ABNORMAL LOW (ref 22–32)
Calcium: 9.3 mg/dL (ref 8.9–10.3)
Chloride: 112 mmol/L — ABNORMAL HIGH (ref 98–111)
Creatinine, Ser: 0.84 mg/dL (ref 0.44–1.00)
GFR, Estimated: >60 mL/min (ref >60)
Glucose, Bld: 87 mg/dL (ref 70–99)
Potassium: 3.5 mmol/L (ref 3.5–5.1)
Sodium: 143 mmol/L (ref 135–145)

## 2021-10-28 NOTE — ED Triage Notes (Signed)
Pt reports she is here today due to left flank pain. Pt also reports chronic low back pain. Pt reports she has had multiple imagining in the past 6 months with no relief. Pt denies any cp,sob,fevers,or any other concerns. Pt denies any urinary problems.

## 2021-10-29 MED ORDER — DIAZEPAM 2 MG PO TABS
2.0000 mg | ORAL_TABLET | Freq: Once | ORAL | Status: AC
  Administered 2021-10-29: 2 mg via ORAL
  Filled 2021-10-29: qty 1

## 2021-10-29 MED ORDER — KETOROLAC TROMETHAMINE 60 MG/2ML IM SOLN
60.0000 mg | Freq: Once | INTRAMUSCULAR | Status: AC
  Administered 2021-10-29: 60 mg via INTRAMUSCULAR
  Filled 2021-10-29: qty 2

## 2021-10-29 NOTE — ED Notes (Signed)
Thew pt is c/o back pain or lt hip pain for 5 months she has seen several doctors without a diagnoses

## 2021-10-29 NOTE — ED Notes (Signed)
The pt got up dressed and was walking down the hall leaving   when I asked her to wait for her discharge papers  she reportewd that she did not want them she had a cab waiting out front and she was leaving  she left

## 2021-10-29 NOTE — ED Provider Notes (Signed)
[facility] EMERGENCY DEPARTMENT Provider Note   CSN: [id number] Arrival date & time: 10/28/21  2221     History  Chief Complaint  Patient presents with   Flank Pain   Back Pain    [name] is a 51 y.o. female.  The history is provided by the patient.  Back Pain Location:  Sacro-iliac joint and lumbar spine Pain severity:  Severe Onset quality:  Gradual Chronicity:  Chronic Relieved by:  Nothing Worsened by:  Movement Associated symptoms: no bladder incontinence, no dysuria, no fever and no weakness   Patient presents with worsening low back pain.  She reports this has been ongoing for up to 5 months.  She reports multiple evaluations by sports medicine, and is scheduled to have physical therapy and pain management.  Patient reports she has had multiple imaging modalities without any clear answer for her pain.  Tonight she was moving around at home when she had worsening pain is mostly in her low back into her left buttock.  She reports the pain was so severe she did fall but no traumatic injury No new leg weakness.  No incontinence.  No previous back surgery.  She has had a previous history of right    Home Medications Prior to Admission medications   Medication Sig Start Date End Date Taking? Authorizing Provider  amLODipine (NORVASC) 10 MG tablet TAKE 1 TABLET(10 MG) BY MOUTH DAILY 09/30/21   Ailene Ards, NP  amoxicillin (AMOXIL) 500 MG tablet Take 2 tablet by mouth on day 1, then take 1 tablet by mouth every 8 hours for 3 days. 09/30/21   Ailene Ards, NP  metFORMIN (GLUCOPHAGE) 500 MG tablet Take 1 tablet (500 mg total) by mouth daily with breakfast. 09/30/21   Ailene Ards, NP  predniSONE (DELTASONE) 10 MG tablet Take twice a day for the next 10 days 10/07/21   Glennon Mac, DO  RESTASIS 0.05 % ophthalmic emulsion 1 drop 2 (two) times daily. 11/24/19   [provider]  tiZANidine (ZANAFLEX) 4 MG tablet Take 1 tablet (4 mg total) by  mouth in the morning and at bedtime. 10/27/21   Glennon Mac, DO  topiramate (TOPAMAX) 25 MG tablet Take 1 tablet (25 mg total) by mouth 2 (two) times daily. 09/30/21   Ailene Ards, NP      Allergies    Xanax [alprazolam]    Review of Systems   Review of Systems  Constitutional:  Negative for fever.  Genitourinary:  Negative for bladder incontinence and dysuria.  Musculoskeletal:  Positive for back pain.  Neurological:  Negative for weakness.   Physical Exam Updated Vital Signs BP 112/84 (BP Location: Right Arm)   Pulse 88   Temp 98.3 F (36.8 C) (Oral)   Resp 20   SpO2 97%  Physical Exam CONSTITUTIONAL: Well developed/well nourished, anxious and tearful HEAD: Normocephalic/atraumatic EYES: EOMI/PERRL ENMT: Mucous membranes moist NECK: supple no meningeal signs SPINE/BACK:entire spine nontender, no bruising/crepitance/stepoffs noted to spine CV: S1/S2 noted, no murmurs/rubs/gallops noted LUNGS: Lungs are clear to auscultation bilaterally, no apparent distress ABDOMEN: soft, nontender, no rebound or guarding GU:no cva tenderness NEURO: Awake/alert, equal motor 5/5 strength noted with the following: hip flexion/knee flexion/extension, foot dorsi/plantar flexion, great toe extension intact bilaterally, no sensory deficit in any dermatome.  EXTREMITIES: pulses normal, full ROM Tenderness noted along the left buttock, no erythema or bruising SKIN: warm, color normal PSYCH: Anxious and tearful  ED Results / Procedures / Treatments  Labs (all labs ordered are listed, but only abnormal results are displayed) Labs Reviewed  BASIC METABOLIC PANEL - Abnormal; Notable for the following components:      Result Value   Chloride 112 (*)    CO2 20 (*)    All other components within normal limits  CBC - Abnormal; Notable for the following components:   WBC 11.3 (*)    Platelets 443 (*)    All other components within normal limits  URINALYSIS, ROUTINE W REFLEX MICROSCOPIC -  Abnormal; Notable for the following components:   Color, Urine STRAW (*)    Specific Gravity, Urine 1.004 (*)    All other components within normal limits    EKG None  Radiology No results found.  Procedures Procedures    Medications Ordered in ED Medications  ketorolac (TORADOL) injection 60 mg (60 mg Intramuscular Given 10/29/21 0032)  diazepam (VALIUM) tablet 2 mg (2 mg Oral Given 10/29/21 0034)    ED Course/ Medical Decision Making/ A&P Clinical Course as of 10/29/21 0303  Sat Oct 29, 2021  0103 WBC(!): 11.3 Mild leukocytosis [DW]  0108 Patient is seen previously found to have back pain and SI joint dysfunction.  She has had MRI of her lumbar spine previously without significant findings We will treat symptoms and reassess [DW]  0155 Patient resting comfortably, no acute distress [DW]    Clinical Course User Index [DW] Ripley Fraise, MD                           Medical Decision Making Amount and/or Complexity of Data Reviewed Labs: ordered. Decision-making details documented in ED Course.  Risk Prescription drug management.   This patient presents to the ED for concern of back pain, this involves an extensive number of treatment options, and is a complaint that carries with it a high risk of complications and morbidity.  The differential diagnosis includes but is not limited to lumbosacral radiculopathy, pyelonephritis, kidney stone, myelopathy  Comorbidities that complicate the patient evaluation: Patient's presentation is complicated by their history of hypertension  Additional history obtained: Records reviewed Primary Care Documents  Lab Tests: I Ordered, and personally interpreted labs.  The pertinent results include: Mild leukocytosis   Medicines ordered and prescription drug management: I ordered medication including Toradol and Valium for pain Reevaluation of the patient after these medicines showed that the patient     improved   Reevaluation: After the interventions noted above, I reevaluated the patient and found that they have :improved  Complexity of problems addressed: Patient's presentation is most consistent with  acute complicated illness/injury requiring diagnostic workup  Disposition: After consideration of the diagnostic results and the patient's response to treatment,  I feel that the patent would benefit from discharge   .   Patient left before final plan could be determined.  Patient walked out the ER        Final Clinical Impression(s) / ED Diagnoses Final diagnoses:  None    Rx / DC Orders ED Discharge Orders     None         Ripley Fraise, MD 10/29/21 [id number]

## 2021-10-29 NOTE — ED Notes (Signed)
To x-ray

## 2021-10-31 ENCOUNTER — Ambulatory Visit: Payer: 59 | Admitting: Sports Medicine

## 2021-11-01 NOTE — Progress Notes (Unsigned)
Aleen Sells D.[name] [address] [place] [address] Phone: [phone]   Assessment and Plan:     There are no diagnoses linked to this encounter.  ***   Pertinent previous records reviewed include ***   Follow Up: ***     Subjective:   I, [name], am serving as a Neurosurgeon for Doctor Richardean Sale   Chief Complaint: low back pain    HPI:  06/17/2021 Patient is is 51 year old female complaining of left sided low back pain. Patient states that she has had 1 week of severe left low back pain that has progressed over the last day to include weakness and severe pain down the left leg with new onset numbness and tingling as well as weakness radiating into the L foot.  PCP who prescribed Flexeril and meloxicam with have not helped alleviate her pain.  She is also tried [place] powder, ice and heat which did not help. No MOI No shots no surgery conservative treatment preferred    07/15/2021 Patient states that she would like a referral to the good foot doctor , pain in the inguinal area that wraps from the low back pain when she stands and puts pressure on it, received a doctors note from dr gray so she can sit and stand , HEP has been doing them causes pain sometimes, Monday night was the worst pain, would like to discuss MRI    08/08/2021 Patient states that she's working it out , still having flare ups    08/31/2021 Patient states everything is good,  it hurts when she over works herself and it only hurts on her left hip and she can feel it coming on stopped going to Pt cause it wasn't doing anything wants her mg in muscle relaxer upped    09/20/2021 Patient states that the pain has gotten worse wen to ER today an was given oxycodone- acetaminophen, pain is the same in the low back down to her butt and wraps around her groin area, today is a bad flare has been like this for the last 2 days went to PCP and was given a shot in her booty     10/07/2021 Patient states she is still in pain doing the same    10/27/2021 Patient states still in a lot of pain, is unable to get comfy   in bed and has to wiggle her way to get in and out of bed  6/7/202 Patient states     Relevant Historical Information: Hypertension, OA right knee  Additional pertinent review of systems negative.   Current Outpatient Medications:    amLODipine (NORVASC) 10 MG tablet, TAKE 1 TABLET(10 MG) BY MOUTH DAILY, Disp: 90 tablet, Rfl: 3   amoxicillin (AMOXIL) 500 MG tablet, Take 2 tablet by mouth on day 1, then take 1 tablet by mouth every 8 hours for 3 days., Disp: 11 tablet, Rfl: 0   metFORMIN (GLUCOPHAGE) 500 MG tablet, Take 1 tablet (500 mg total) by mouth daily with breakfast., Disp: 90 tablet, Rfl: 2   predniSONE (DELTASONE) 10 MG tablet, Take twice a day for the next 10 days, Disp: 20 tablet, Rfl: 0   RESTASIS 0.05 % ophthalmic emulsion, 1 drop 2 (two) times daily., Disp: , Rfl:    tiZANidine (ZANAFLEX) 4 MG tablet, Take 1 tablet (4 mg total) by mouth in the morning and at bedtime., Disp: 30 tablet, Rfl: 0   topiramate (TOPAMAX) 25 MG tablet,  Take 1 tablet (25 mg total) by mouth 2 (two) times daily., Disp: 180 tablet, Rfl: 3   Objective:     There were no vitals filed for this visit.    There is no height or weight on file to calculate BMI.    Physical Exam:    ***   Electronically signed by:  Aleen Sells D.[name] 7:51 AM 11/01/21

## 2021-11-02 ENCOUNTER — Ambulatory Visit: Payer: 59 | Admitting: Sports Medicine

## 2021-11-02 ENCOUNTER — Ambulatory Visit: Payer: Self-pay

## 2021-11-02 VITALS — BP 128/84 | HR 75 | Ht 67.0 in | Wt 174.0 lb

## 2021-11-02 DIAGNOSIS — M533 Sacrococcygeal disorders, not elsewhere classified: Secondary | ICD-10-CM

## 2021-11-02 DIAGNOSIS — M545 Low back pain, unspecified: Secondary | ICD-10-CM | POA: Diagnosis not present

## 2021-11-02 DIAGNOSIS — G8929 Other chronic pain: Secondary | ICD-10-CM | POA: Diagnosis not present

## 2021-11-02 NOTE — Patient Instructions (Addendum)
Good to see you  New referral to pain management  2-3 weeks after you have established care with PT and Pain Management

## 2021-11-04 ENCOUNTER — Other Ambulatory Visit: Payer: Self-pay | Admitting: Nurse Practitioner

## 2021-11-04 DIAGNOSIS — K047 Periapical abscess without sinus: Secondary | ICD-10-CM

## 2021-11-08 ENCOUNTER — Telehealth: Payer: Self-pay | Admitting: Sports Medicine

## 2021-11-08 MED ORDER — TIZANIDINE HCL 4 MG PO TABS: 4.0000 mg | ORAL_TABLET | Freq: Four times a day (QID) | ORAL | 0 refills | Status: DC | PRN

## 2021-11-08 MED ORDER — AMOXICILLIN 500 MG PO TABS: ORAL_TABLET | ORAL | 0 refills | Status: DC

## 2021-11-08 NOTE — Addendum Note (Signed)
Addended by: [name] R on: 11/08/2021 03:52 PM   Modules accepted: Orders

## 2021-11-08 NOTE — Telephone Encounter (Signed)
Please inform patient she will need to see a dentist for further treatment if she is continuing to have tooth pain. I will approve this refill but she needs to see a dentist for further treatment.

## 2021-11-08 NOTE — Telephone Encounter (Signed)
Patient called stating that they will not fill her tiZANidine (ZANAFLEX) 4 MG tablet again because they are needing more detailed instructions (to include the amount of time between dosing).   [facility] has updated and sent the rx.

## 2021-11-14 ENCOUNTER — Telehealth: Payer: Self-pay | Admitting: Nurse Practitioner

## 2021-11-14 NOTE — Telephone Encounter (Signed)
1.Medication Requested: topiramate (TOPAMAX) 25 MG tablet 2. Pharmacy (Name, Street, [facility]): [facility] [phone] - [place], [place] - [address] AT [facility] Phone:  [phone]  Fax:  [phone]     3. On Med List: Yes  4. Last Visit with PCP: 5.5.2023  5. Next visit date with PCP: 8.10.2023   Agent: Please be advised that RX refills may take up to 3 business days. We ask that you follow-up with your pharmacy.

## 2021-11-15 NOTE — Telephone Encounter (Signed)
Spoke with the pharmacy to confirm Rx request, according to last refill it is too soo to refill. Patient states that she only received 30 with 0 refill. Pharmacy says that was an old script from March and they will refill for new script for 180 and 3. Patient informed.

## 2021-12-05 ENCOUNTER — Ambulatory Visit (HOSPITAL_COMMUNITY)
Admission: EM | Admit: 2021-12-05 | Discharge: 2021-12-05 | Disposition: A | Discharge status: Home / Self Care | Payer: 59 | Attending: Physician Assistant | Admitting: Physician Assistant

## 2021-12-05 ENCOUNTER — Ambulatory Visit (INDEPENDENT_AMBULATORY_CARE_PROVIDER_SITE_OTHER): Payer: 59

## 2021-12-05 ENCOUNTER — Encounter (HOSPITAL_COMMUNITY): Payer: Self-pay

## 2021-12-05 DIAGNOSIS — M25512 Pain in left shoulder: Secondary | ICD-10-CM

## 2021-12-05 MED ORDER — NAPROXEN 500 MG PO TABS: 500.0000 mg | ORAL_TABLET | Freq: Two times a day (BID) | ORAL | 0 refills | Status: DC

## 2021-12-05 MED ORDER — BACLOFEN 10 MG PO TABS: 10.0000 mg | ORAL_TABLET | Freq: Two times a day (BID) | ORAL | 0 refills | Status: DC

## 2021-12-05 NOTE — ED Triage Notes (Signed)
Pt reports left shoulder x 2 days. She reports hearing a pop after turning her arm yesterday.

## 2021-12-05 NOTE — ED Provider Notes (Signed)
[facility]    CSN: [id number] Arrival date & time: 12/05/21  1123      History   Chief Complaint Chief Complaint  Patient presents with   Shoulder Pain    HPI [name] is a 51 y.o. female.   Patient presents today with a 2-day history of severe left shoulder pain.  Reports that she was trying to get out of bed when she placed her left elbow down propping herself up when she felt a pop and sudden pain in her left shoulder.  Since that time she has had ongoing pain.  Pain is rated 10 on a 0-10 pain scale, localized to right shoulder with radiation into neck and arm, described as throbbing/aching, worse with certain movements, no alleviating factors identified.  She has tried a muscle relaxer from a previous prescription without improvement of symptoms.  She does find that holding her arm close to her and keeping the elbow flexed provides some relief.  She is right-handed.  Denies any numbness or paresthesias in hand.  She is having difficulty with movement of the shoulder due to pain.  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  Denies previous injury or surgery involving shoulder.    Past Medical History:  Diagnosis Date   Gout    Hypertension    Migraines     Patient Active Problem List   Diagnosis Date Noted   Snoring 09/30/2021   Daytime sleepiness 09/30/2021   Prediabetes 09/30/2021   Tooth abscess 09/30/2021   Chronic sciatica of left side 09/30/2021   Encounter for screening mammogram for malignant neoplasm of breast 09/30/2021   HLD (hyperlipidemia) 09/30/2021   PTSD (post-traumatic stress disorder) 05/05/2021   Strain of lumbar region 05/05/2021   Situational anxiety 05/05/2021   Primary osteoarthritis of right knee 11/05/2020   Patellofemoral arthritis of right knee 06/21/2018   Cervical radiculopathy 06/21/2018   Essential hypertension 05/14/2018   Tobacco abuse 05/14/2018   Intractable  migraine without status migrainosus 05/14/2018   Gout 05/14/2018    Past Surgical History:  Procedure Laterality Date   ABDOMINAL HYSTERECTOMY  2008    OB History   No obstetric history on file.      Home Medications    Prior to Admission medications   Medication Sig Start Date End Date Taking? Authorizing Provider  baclofen (LIORESAL) 10 MG tablet Take 1 tablet (10 mg total) by mouth 2 (two) times daily. 12/05/21  Yes Loys Hoselton K, PA-C  naproxen (NAPROSYN) 500 MG tablet Take 1 tablet (500 mg total) by mouth 2 (two) times daily. 12/05/21  Yes Viera Okonski K, PA-C  amLODipine (NORVASC) 10 MG tablet TAKE 1 TABLET(10 MG) BY MOUTH DAILY 09/30/21   Elenore Paddy, NP  amoxicillin (AMOXIL) 500 MG tablet Take 2 tablet by mouth on day 1, then take 1 tablet by mouth every 8 hours for 3 days. 11/08/21   Elenore Paddy, NP  metFORMIN (GLUCOPHAGE) 500 MG tablet Take 1 tablet (500 mg total) by mouth daily with breakfast. 09/30/21   Elenore Paddy, NP  RESTASIS 0.05 % ophthalmic emulsion 1 drop 2 (two) times daily. 11/24/19   [provider]  topiramate (TOPAMAX) 25 MG tablet Take 1 tablet (25 mg total) by mouth 2 (two) times daily. 09/30/21   Elenore Paddy, NP    Family History Family History  Problem Relation Age of Onset   Hypertension Mother    Healthy  Father     Social History Social History   Tobacco Use   Smoking status: Every Day    Packs/day: 0.10    Types: Cigarettes   Smokeless tobacco: Never   Tobacco comments:    pack lasts 3 weeks  Vaping Use   Vaping Use: Never used  Substance Use Topics   Alcohol use: Not Currently    Comment: occasional   Drug use: No     Allergies   Xanax [alprazolam]   Review of Systems Review of Systems  Constitutional:  Positive for activity change. Negative for appetite change, fatigue and fever.  Musculoskeletal:  Positive for arthralgias. Negative for gait problem, joint swelling and myalgias.  Skin:  Negative for color change  and wound.  Neurological:  Negative for dizziness, weakness, light-headedness, numbness and headaches.     Physical Exam Triage Vital Signs ED Triage Vitals [12/05/21 1223]  Enc Vitals Group     BP 125/76     Pulse Rate 67     Resp 16     Temp 97.8 F (36.6 C)     Temp Source Oral     SpO2 99 %     Weight      Height      Head Circumference      Peak Flow      Pain Score      Pain Loc      Pain Edu?      Excl. in GC?    No data found.  Updated Vital Signs BP 125/76 (BP Location: Left Arm)   Pulse 67   Temp 97.8 F (36.6 C) (Oral)   Resp 16   SpO2 99%   Visual Acuity Right Eye Distance:   Left Eye Distance:   Bilateral Distance:    Right Eye Near:   Left Eye Near:    Bilateral Near:     Physical Exam Vitals reviewed.  Constitutional:      General: She is awake. She is not in acute distress.    Appearance: Normal appearance. She is well-developed. She is not ill-appearing.     Comments: Very pleasant female appears stated age in no acute distress sitting comfortably in exam room  HENT:     Head: Normocephalic and atraumatic.  Cardiovascular:     Rate and Rhythm: Normal rate and regular rhythm.     Heart sounds: Normal heart sounds, S1 normal and S2 normal. No murmur heard. Pulmonary:     Effort: Pulmonary effort is normal.     Breath sounds: Normal breath sounds. No wheezing, rhonchi or rales.     Comments: Clear to auscultation bilaterally Abdominal:     Palpations: Abdomen is soft.     Tenderness: There is no abdominal tenderness.  Musculoskeletal:     Left shoulder: Swelling, tenderness and bony tenderness present. Decreased range of motion. Decreased strength.     Comments: Left shoulder: Significant tenderness palpation along the scapular spine and AC joint.  Swelling noted over humeral head.  Decreased range of motion secondary to pain.  Patient unable to perform special tests for rotator cuff.  Psychiatric:        Behavior: Behavior is  cooperative.      UC Treatments / Results  Labs (all labs ordered are listed, but only abnormal results are displayed) Labs Reviewed - No data to display  EKG   Radiology DG Shoulder Left  Result Date: 12/05/2021 CLINICAL DATA:  LEFT shoulder pain and decreased range of motion.  EXAM: LEFT SHOULDER - 2+ VIEW COMPARISON:  None Available. FINDINGS: Osseous alignment is normal. Bone mineralization is normal. No fracture line or displaced fracture fragment is seen. No acute-appearing cortical irregularity or osseous lesion. No degenerative change seen. Soft tissues about the LEFT shoulder are unremarkable. IMPRESSION: Negative. Electronically Signed   By: Bary Richard M.D.   On: 12/05/2021 12:56    Procedures Procedures (including critical care time)  Medications Ordered in UC Medications - No data to display  Initial Impression / Assessment and Plan / UC Course  I have reviewed the triage vital signs and the nursing notes.  Pertinent labs & imaging results that were available during my care of the patient were reviewed by me and considered in my medical decision making (see chart for details).     X-ray obtained given severity of pain which showed no osseous abnormality.  Patient was unable to perform special test to evaluate rotator cuff given severity of pain but concerned that this is the etiology of symptoms.  She was given Naprosyn for pain management with instruction not to take NSAIDs with this medication due to risk of GI bleeding.  Can use Tylenol for symptom relief.  She was given baclofen for additional pain relief.  Discussed that this is sedating and she should not drive or drink alcohol with taking it.  Encouraged her to follow-up with Ortho soon as possible.  She was given contact information for local provider with instruction to call to schedule an appointment.  They do have a walk-in clinic and discussed that if she has difficulty scheduling appointment she can go to  their walk-in clinic for further evaluation and management.  She was placed in a sling for comfort and support but discussed that she should not use this on a regular basis as it will cause increased deafness and frozen shoulder.  She can only use it for 1 to 2 days until she is seen by orthopedics for further evaluation and management.  Discussed alarm symptoms that warrant emergent evaluation.  Strict return precautions given.  Work excuse note provided.  Final Clinical Impressions(s) / UC Diagnoses   Final diagnoses:  Acute pain of left shoulder     Discharge Instructions      Your x-ray was normal.  I am concerned about your rotator cuff.  Please follow-up with orthopedics soon as possible; call to schedule an appointment.  Take Naprosyn twice daily for pain.  Do not take NSAIDs with this medication including aspirin, ibuprofen/Advil, naproxen/Aleve.  You can take Tylenol.  Take baclofen up to twice a day.  This make you sleepy do not drive or drink alcohol with taking it.  Use the sling for support.  You should not use this for more than a day or 2 because it will cause stiffness in the shoulder.  If anything worsens please be seen immediately.  Call orthopedics as soon as possible.     ED Prescriptions     Medication Sig Dispense Auth. Provider   naproxen (NAPROSYN) 500 MG tablet Take 1 tablet (500 mg total) by mouth 2 (two) times daily. 30 tablet Baran Kuhrt K, PA-C   baclofen (LIORESAL) 10 MG tablet Take 1 tablet (10 mg total) by mouth 2 (two) times daily. 30 each Stayce Delancy, Noberto Retort, PA-C      PDMP not reviewed this encounter.   Jeani Hawking, PA-C 12/05/21 1319

## 2021-12-05 NOTE — Discharge Instructions (Signed)
Your x-ray was normal.  I am concerned about your rotator cuff.  Please follow-up with orthopedics soon as possible; call to schedule an appointment.  Take Naprosyn twice daily for pain.  Do not take NSAIDs with this medication including aspirin, ibuprofen/Advil, naproxen/Aleve.  You can take Tylenol.  Take baclofen up to twice a day.  This make you sleepy do not drive or drink alcohol with taking it.  Use the sling for support.  You should not use this for more than a day or 2 because it will cause stiffness in the shoulder.  If anything worsens please be seen immediately.  Call orthopedics as soon as possible.

## 2021-12-25 ENCOUNTER — Other Ambulatory Visit: Payer: Self-pay

## 2021-12-25 ENCOUNTER — Emergency Department (HOSPITAL_COMMUNITY): Payer: 59

## 2021-12-25 ENCOUNTER — Encounter (HOSPITAL_COMMUNITY): Payer: Self-pay | Admitting: Emergency Medicine

## 2021-12-25 ENCOUNTER — Emergency Department (HOSPITAL_COMMUNITY)
Admission: EM | Admit: 2021-12-25 | Discharge: 2021-12-25 | Disposition: A | Discharge status: Home / Self Care | Payer: 59 | Attending: Emergency Medicine | Admitting: Emergency Medicine

## 2021-12-25 DIAGNOSIS — K5900 Constipation, unspecified: Secondary | ICD-10-CM | POA: Insufficient documentation

## 2021-12-25 DIAGNOSIS — M25512 Pain in left shoulder: Secondary | ICD-10-CM | POA: Diagnosis present

## 2021-12-25 MED ORDER — GLYCERIN (ADULT) 2 G RE SUPP: 1.0000 | RECTAL | 0 refills | Status: DC | PRN

## 2021-12-25 MED ORDER — KETOROLAC TROMETHAMINE 15 MG/ML IJ SOLN
15.0000 mg | Freq: Once | INTRAMUSCULAR | Status: AC
Start: 2021-12-25 — End: 2021-12-25
  Administered 2021-12-25: 15 mg via INTRAMUSCULAR
  Filled 2021-12-25: qty 1

## 2021-12-25 MED ORDER — MELOXICAM 7.5 MG PO TABS: 7.5000 mg | ORAL_TABLET | Freq: Every day | ORAL | 0 refills | Status: DC

## 2021-12-25 NOTE — ED Triage Notes (Signed)
Patient reports left shoulder joint pain with stiffness and limited ROM , denies injury or fall . She adds intermittent constipation unrelieved by OTC laxatives/stool softener.

## 2021-12-25 NOTE — ED Provider Notes (Signed)
[facility] EMERGENCY DEPARTMENT Provider Note   CSN: [id number] Arrival date & time: 12/25/21  2671     History  Chief Complaint  Patient presents with   Shoulder Joint Pain     [name] is a 51 y.o. female.  Patient presents to the emergency department today for evaluation of left shoulder pain.  Patient had an injury around 12/03/2021 and has had pain in the left shoulder since that time.  She states that the pain is worse when she raises her arm above shoulder height and when she tries to externally rotate.  She has pain in the lateral and posterior shoulder.  Pain radiates down into the arm.  No swelling of the arm.  She initially saw urgent care on 7/10.  She had a negative x-ray and was prescribed naproxen and a muscle relaxer.  She states that these have not been helping.  She was given orthopedic referral, but cannot afford to follow-up.  She works at [employer]  No numbness or tingling reported.  No weakness in the hand.  She also reports constipation in the urge to have a bowel movement.  No abdominal pain or vomiting.  She has tried MiraLAX, up to twice a day, without results.  She also has also tried over-the-counter laxatives.       Home Medications Prior to Admission medications   Medication Sig Start Date End Date Taking? Authorizing Provider  glycerin adult 2 g suppository Place 1 suppository rectally as needed for constipation. 12/25/21  Yes Renne Crigler, PA-C  meloxicam (MOBIC) 7.5 MG tablet Take 1 tablet (7.5 mg total) by mouth daily. 12/25/21  Yes Renne Crigler, PA-C  amLODipine (NORVASC) 10 MG tablet TAKE 1 TABLET(10 MG) BY MOUTH DAILY 09/30/21   Elenore Paddy, NP  amoxicillin (AMOXIL) 500 MG tablet Take 2 tablet by mouth on day 1, then take 1 tablet by mouth every 8 hours for 3 days. 11/08/21   Elenore Paddy, NP  baclofen (LIORESAL) 10 MG tablet Take 1 tablet (10 mg total) by mouth 2 (two) times daily. 12/05/21   Raspet, Noberto Retort, PA-C   metFORMIN (GLUCOPHAGE) 500 MG tablet Take 1 tablet (500 mg total) by mouth daily with breakfast. 09/30/21   Elenore Paddy, NP  RESTASIS 0.05 % ophthalmic emulsion 1 drop 2 (two) times daily. 11/24/19   [provider]  topiramate (TOPAMAX) 25 MG tablet Take 1 tablet (25 mg total) by mouth 2 (two) times daily. 09/30/21   Elenore Paddy, NP      Allergies    Xanax [alprazolam]    Review of Systems   Review of Systems  Physical Exam Updated Vital Signs BP (!) 130/95 (BP Location: Right Arm)   Pulse 86   Temp 98.2 F (36.8 C) (Oral)   Resp 16   SpO2 96%  Physical Exam Vitals and nursing note reviewed.  Constitutional:      Appearance: She is well-developed.  HENT:     Head: Normocephalic and atraumatic.  Eyes:     Pupils: Pupils are equal, round, and reactive to light.  Cardiovascular:     Pulses: Normal pulses. No decreased pulses.  Abdominal:     Palpations: Abdomen is soft.     Tenderness: There is no abdominal tenderness.  Musculoskeletal:        General: Tenderness present.     Cervical back: Normal range of motion and neck supple.     Comments: Left upper extremity: 2+ radial  pulse, full active range of motion of the wrist and elbow.  Patient with tenderness to palpation over the lateral and posterior deltoid muscles.  No significant pain anteriorly.  She winces when she tries to lift her arm above shoulder height.  Range of motion is decreased in the left shoulder.  She reports that it feels stiff.  Skin:    General: Skin is warm and dry.  Neurological:     Mental Status: She is alert.     Sensory: No sensory deficit.     Comments: Motor, sensation, and vascular distal to the injury is fully intact.   Psychiatric:        Mood and Affect: Mood normal.     ED Results / Procedures / Treatments   Labs (all labs ordered are listed, but only abnormal results are displayed) Labs Reviewed - No data to display  EKG None  Radiology DG Shoulder Left  Result  Date: 12/25/2021 CLINICAL DATA:  51 year old female with history of pain and swelling in the left shoulder. EXAM: LEFT SHOULDER - 2+ VIEW COMPARISON:  Left shoulder radiograph 12/05/2021. FINDINGS: There is no evidence of fracture or dislocation. There is no evidence of arthropathy or other focal bone abnormality. Soft tissues are unremarkable. IMPRESSION: Negative. Electronically Signed   By: Trudie Reed M.D.   On: 12/25/2021 08:03    Procedures Procedures    Medications Ordered in ED Medications  ketorolac (TORADOL) 15 MG/ML injection 15 mg (has no administration in time range)    ED Course/ Medical Decision Making/ A&P    Patient seen and examined. History obtained directly from patient.  Reviewed urgent care notes.  Reviewed previous x-ray.  Labs/EKG: None ordered  Imaging: X-ray of the left shoulder, agree negative, no dislocation or fracture.  Medications/Fluids: Ordered: IM Toradol  Most recent vital signs reviewed and are as follows: BP (!) 130/95 (BP Location: Right Arm)   Pulse 86   Temp 98.2 F (36.8 C) (Oral)   Resp 16   SpO2 96%   Initial impression: Left shoulder pain, constipation without obstructive symptoms  Home treatment plan: For shoulder pain, will prescribe course of meloxicam, give orthopedic referral and encouraged follow-up.  Encouraged using sling as needed for comfort and gentle stretching and range of motion exercises.  For constipation, continue MiraLAX twice a day, will add glycerin suppositories.  Encouraged good hydration.  Return instructions discussed with patient: Return with worsening abdominal pain or vomiting.  Follow-up instructions discussed with patient: Encourage PCP follow-up in 1 week for recheck of constipation, encouraged follow-up with orthopedic referral for evaluation of shoulder injury which is causing longer-term problems.                          Medical Decision Making Amount and/or Complexity of Data  Reviewed Radiology: ordered.  Risk OTC drugs. Prescription drug management.   Constipation: We will continue OTC meds.  No concerning abdominal pain or obstructive symptoms.  Shoulder pain: X-ray negative without new findings.  No signs of septic arthritis or inflammatory arthritis.  Do not suspect cervical radiculopathy.  Not suspect arterial insufficiency or DVT.  She would benefit from orthopedic follow-up.        Final Clinical Impression(s) / ED Diagnoses Final diagnoses:  Acute pain of left shoulder  Constipation, unspecified constipation type    Rx / DC Orders ED Discharge Orders          Ordered    meloxicam (MOBIC)  7.5 MG tablet  Daily        12/25/21 0820    glycerin adult 2 g suppository  As needed        12/25/21 0820              Renne Crigler, PA-C 12/25/21 0827    Virgina Norfolk, DO 12/25/21 1329

## 2021-12-25 NOTE — Discharge Instructions (Signed)
Please read and follow all provided instructions.  Your diagnoses today include:  1. Acute pain of left shoulder   2. Constipation, unspecified constipation type     Tests performed today include: An x-ray of the affected area - does NOT show any broken bones Vital signs. See below for your results today.   Medications prescribed:  Meloxicam - anti-inflammatory pain medication  You have been prescribed an anti-inflammatory medication or NSAID. Take with food. Do not take aspirin, ibuprofen, or naproxen if taking this medication. Take smallest effective dose for the shortest duration needed for your pain. Stop taking if you experience stomach pain or vomiting.   Glycerin suppository - medication to help induce bowel movement  Take any prescribed medications only as directed.  Home care instructions:  Follow any educational materials contained in this packet Continue MiraLAX, twice a day Use sling as needed for comfort Follow R.I.C.E. Protocol: R - rest your injury  I  - use ice on injury without applying directly to skin C - compress injury with bandage or splint E - elevate the injury as much as possible  Follow-up instructions: Please follow-up with your primary care provider or the provided orthopedic physician (bone specialist) in 1 week. In this case you may have a more severe injury that requires further care.   Return instructions:  Please return if your fingers are numb or tingling, appear gray or blue, or you have severe pain (also elevate the arm and loosen splint or wrap if you were given one) Please return to the Emergency Department if you experience worsening symptoms.  Please return if you have any other emergent concerns.  Additional Information:  Your vital signs today were: BP (!) 130/95 (BP Location: Right Arm)   Pulse 86   Temp 98.2 F (36.8 C) (Oral)   Resp 16   SpO2 96%  If your blood pressure (BP) was elevated above 135/85 this visit, please have  this repeated by your doctor within one month. --------------

## 2022-01-01 DIAGNOSIS — Z79899 Other long term (current) drug therapy: Secondary | ICD-10-CM | POA: Insufficient documentation

## 2022-01-01 DIAGNOSIS — M899 Disorder of bone, unspecified: Secondary | ICD-10-CM | POA: Insufficient documentation

## 2022-01-01 DIAGNOSIS — G894 Chronic pain syndrome: Secondary | ICD-10-CM | POA: Insufficient documentation

## 2022-01-01 DIAGNOSIS — Z789 Other specified health status: Secondary | ICD-10-CM | POA: Insufficient documentation

## 2022-01-01 NOTE — Progress Notes (Signed)
Patient: [name]  Service Category: E/M  Provider: Gaspar Cola, MD  DOB: 27-Nov-1970  DOS: 01/02/2022  Referring Provider: Glennon Mac, DO  MRN: [id number]  Setting: Ambulatory outpatient  PCP: Patient, No Pcp Per  Type: New Patient  Specialty: Interventional Pain Management    Location: Office  Delivery: Face-to-face     Primary Reason(s) for Visit: Encounter for initial evaluation of one or more chronic problems (new to examiner) potentially causing chronic pain, and posing a threat to normal musculoskeletal function. (Level of risk: High) CC: Back Pain (Left side)  HPI  [name] is a 51 y.o. year old, female patient, who comes for the first time to our practice referred by Glennon Mac, DO for our initial evaluation of her chronic pain. She has Essential hypertension; Tobacco abuse; Intractable migraine without status migrainosus; Gout; Patellofemoral arthritis of knee (Right); Cervical radiculopathy; Osteoarthritis of knee (Right); PTSD (post-traumatic stress disorder); Strain of lumbar region; Situational anxiety; Snoring; Daytime sleepiness; Prediabetes; Tooth abscess; Encounter for screening mammogram for malignant neoplasm of breast; HLD (hyperlipidemia); Chronic pain syndrome; Pharmacologic therapy; Disorder of skeletal system; Problems influencing health status; Abnormal MRI, lumbar spine (09/25/2021); Chronic hip pain (2ry area of Pain) (Left); Chronic low back pain (1ry area of Pain) (Left) w/o sciatica; Chronic groin pain (3ry area of Pain) (Left); and Greater trochanteric bursitis (4th area of Pain) (Left) on their problem list. Today she comes in for evaluation of her Back Pain (Left side)  Pain Assessment: Location: Left Back Radiating: pain radiaties down to her buttock of left side Onset: More than a month ago Duration: Chronic pain Quality: Burning, Aching, Constant, Shooting, Throbbing, Stabbing, Nagging, Discomfort, Spasm Severity: 10-Worst pain  ever/10 (subjective, self-reported pain score)  Effect on ADL: limits my daily activities Timing: Constant Modifying factors: nothing BP: (!) 145/95  HR: 91  Onset and Duration: Gradual Cause of pain: Unknown Severity: Getting worse, NAS-11 at its worse: 10/10, NAS-11 at its best: 07/10, NAS-11 now: 10/10, and NAS-11 on the average: 10/10 Timing: Not influenced by the time of the day Aggravating Factors: Bowel movements, Prolonged standing, and Walking Alleviating Factors:  nothing Associated Problems: Fatigue, Numbness, Spasms, Pain that wakes patient up, and Pain that does not allow patient to sleep Quality of Pain: Aching, Burning, Constant, Nagging, Pressure-like, Sharp, Shooting, Stabbing, Throbbing, Toothache-like, and Uncomfortable Previous Examinations or Tests: CT scan, MRI scan, and X-rays Previous Treatments: Epidural steroid injections and Physical Therapy  According to the patient the primary area of pain is that of the lower back (Left).  She denies any prior surgeries but she does admit to having had physical therapy in 2023 at a practice right across the street from [facility].  The pain apparently started 3 months ago and she refers that they have tried everything and nobody can figure out what is causing the pain.  On 10/27/2021 the patient had what appears to be a left-sided SI joint block done by Glennon Mac, DO.  The patient refers that not only did not not help the pain, but it aggravated the pain in such a way that the next day she ended up in the ED seeking some help.  She describes the pain to be only on the left side of her lower back around the area of the left iliac crest and going down through the back of the leg to the upper third of her arms during on the left side.  She denies any pain, numbness, or weakness from that  level down.  She also indicates having experienced pain in the left groin area.  She indicates that there was no trauma associated with this pain  and it started slowly over 3 months ago.  She has tried over-the-counter medications such as Tylenol, NSAIDs, and muscle relaxants.  She tried the physical therapy but it did not help.  She describes that the pain does not allow her to sleep at night and it is interfering with her ability to work.  The pain worsens when she lays down on the left side (left lateral decubiti) but it worsens significantly when she stands or walks for prolonged periods of time.  Physical exam today was negative for Lumbar hyperextension, Lumbar hyperextension on rotation, or Kemp maneuver.  Straight leg raise was completely negative on the right side and on the left side she was able to raise the leg almost to 90 degrees, but this would trigger the pain in the left lower back and buttocks area.  Patrick maneuver was completely negative on the right side for SI joint and hip joint arthralgia, and negative on the left side for SI joint arthralgia.  The Patrick maneuver was positive on the left side for hip joint arthralgia with painful range of motion of the hip joint, as well as decreased range of motion due to the pain.  The patient was able to toe walk and heel walk without any problems.  Today I took the time to provide the patient with information regarding my pain practice. The patient was informed that my practice is divided into two sections: an interventional pain management section, as well as a completely separate and distinct medication management section. I explained that I have procedure days for my interventional therapies, and evaluation days for follow-ups and medication management. Because of the amount of documentation required during both, they are kept separated. This means that there is the possibility that she may be scheduled for a procedure on one day, and medication management the next. I have also informed her that because of staffing and facility limitations, I no longer take patients for medication  management only. To illustrate the reasons for this, I gave the patient the example of surgeons, and how inappropriate it would be to refer a patient to his/her care, just to write for the post-surgical antibiotics on a surgery done by a different surgeon.   Because interventional pain management is my board-certified specialty, the patient was informed that joining my practice means that they are open to any and all interventional therapies. I made it clear that this does not mean that they will be forced to have any procedures done. What this means is that I believe interventional therapies to be essential part of the diagnosis and proper management of chronic pain conditions. Therefore, patients not interested in these interventional alternatives will be better served under the care of a different practitioner.  The patient was also made aware of my Comprehensive Pain Management Safety Guidelines where by joining my practice, they limit all of their nerve blocks and joint injections to those done by our practice, for as long as we are retained to manage their care.   Historic Controlled Substance Pharmacotherapy Review  PMP and historical list of controlled substances: Oxycodone/APAP 7.5/325, 1 tab p.o. 3 times daily (# 9) (last filled on 09/20/2021); diazepam 5 mg tablet, 1 tab p.o. 3 times daily (# 12) (last filled on 06/15/2021); tramadol 50 mg tablet, 1 tab p.o. 4 times daily (# 30) (last  filled on 11/16/2020); Tylenol #3, 1 tab p.o. 3 times daily (# 21) (last filled on 07/23/2020); hydrocodone/APAP 5/325, 1 tab p.o. daily (#7) (last filled on 03/26/2020) Current opioid analgesics:   None MME/day: 0 mg/day  Historical Monitoring: The patient  reports no history of drug use. List of prior UDS Testing: No results found for: "MDMA", "COCAINSCRNUR", "PCPSCRNUR", "PCPQUANT", "CANNABQUANT", "THCU", "ETH", "CBDTHCR", "D8THCCBX", "D9THCCBX" Historical Background Evaluation: [place] PMP: PDMP reviewed during  this encounter. Review of the past 45-month conducted.             PMP NARX Score Report:  Narcotic: 140 Sedative: 090 Stimulant: 000 [place] Department of public safety, offender search: (Editor, commissioningInformation) Non-contributory Risk Assessment Profile: Aberrant behavior: None observed or detected today Risk factors for fatal opioid overdose: None identified today PMP NARX Overdose Risk Score: 150 Fatal overdose hazard ratio (HR): Calculation deferred Non-fatal overdose hazard ratio (HR): Calculation deferred Risk of opioid abuse or dependence: 0.7-3.0% with doses ? 36 MME/day and 6.1-26% with doses ? 120 MME/day. Substance use disorder (SUD) risk level: See below Personal History of Substance Abuse (SUD-Substance use disorder):  Alcohol:    Illegal Drugs:    Rx Drugs:    ORT Risk Level calculation:    ORT Scoring interpretation table:  Score <3 = Low Risk for SUD  Score between 4-7 = Moderate Risk for SUD  Score >8 = High Risk for Opioid Abuse   PHQ-2 Depression Scale:  Total score:    PHQ-2 Scoring interpretation table: (Score and probability of major depressive disorder)  Score 0 = No depression  Score 1 = 15.4% Probability  Score 2 = 21.1% Probability  Score 3 = 38.4% Probability  Score 4 = 45.5% Probability  Score 5 = 56.4% Probability  Score 6 = 78.6% Probability   PHQ-9 Depression Scale:  Total score:    PHQ-9 Scoring interpretation table:  Score 0-4 = No depression  Score 5-9 = Mild depression  Score 10-14 = Moderate depression  Score 15-19 = Moderately severe depression  Score 20-27 = Severe depression (2.4 times higher risk of SUD and 2.89 times higher risk of overuse)   Pharmacologic Plan: As per protocol, I have not taken over any controlled substance management, pending the results of ordered tests and/or consults.            Initial impression: Pending review of available data and ordered tests.  Meds   Current Outpatient Medications:    amLODipine (NORVASC)  10 MG tablet, TAKE 1 TABLET(10 MG) BY MOUTH DAILY, Disp: 90 tablet, Rfl: 3   baclofen (LIORESAL) 10 MG tablet, Take 1 tablet (10 mg total) by mouth 2 (two) times daily., Disp: 30 each, Rfl: 0   meloxicam (MOBIC) 7.5 MG tablet, Take 1 tablet (7.5 mg total) by mouth daily., Disp: 10 tablet, Rfl: 0   RESTASIS 0.05 % ophthalmic emulsion, 1 drop 2 (two) times daily., Disp: , Rfl:    topiramate (TOPAMAX) 25 MG tablet, Take 1 tablet (25 mg total) by mouth 2 (two) times daily., Disp: 180 tablet, Rfl: 3  Imaging Review  Cervical Imaging: Cervical DG 2-3 views: Results for orders placed during the hospital encounter of 06/28/18 DG Cervical Spine 2 or 3 views  Narrative CLINICAL DATA:  Rt side neck AND shoulder pain since this morning, pain radiating to rt arm AND hand, NKI. Pt had hx of rt elbow sx 2 yrs ago.  EXAM: CERVICAL SPINE - 2-3 VIEW  COMPARISON:  None.  FINDINGS: There is  no evidence of cervical spine fracture or prevertebral soft tissue swelling. Alignment is normal. No other significant bone abnormalities are identified.  IMPRESSION: Negative cervical spine radiographs.   Electronically Signed By: Lajean Manes M.D. On: 06/28/2018 15:40  Cervical DG complete: Results for orders placed during the hospital encounter of 05/14/18 DG Cervical Spine Complete  Narrative CLINICAL DATA:  51 year old female with neck pain and stiffness for the past week. No known injury. Initial encounter.  EXAM: CERVICAL SPINE - COMPLETE 4+ VIEW  COMPARISON:  None.  FINDINGS: Straightening of the cervical spine. No fracture or abnormal prevertebral soft tissue swelling noted. Minimal narrowing anterior aspect C5-6 disc space with small anterior spur. No bony neural foraminal narrowing. No worrisome lung apical lesion. Minimal pleural thickening.  IMPRESSION: 1. Straightening of the cervical spine. 2. Minimal narrowing anterior aspect C5-6 disc space with small anterior  spur.   Electronically Signed By: Genia Del M.D. On: 05/14/2018 10:45  Shoulder Imaging: Shoulder-R DG: Results for orders placed during the hospital encounter of 06/28/18 DG Shoulder Right  Narrative CLINICAL DATA:  Right neck pain, shoulder pain.  No known injury.  EXAM: RIGHT SHOULDER - 2+ VIEW  COMPARISON:  07/28/2015  FINDINGS: Degenerative changes in the [facility] joint with joint space narrowing and spurring. Glenohumeral joint is maintained. No acute bony abnormality. Specifically, no fracture, subluxation, or dislocation. Soft tissues are intact.  IMPRESSION: Mild degenerative changes in the right AC joint. No acute bony abnormality.   Electronically Signed By: Rolm Baptise M.D. On: 06/28/2018 15:41  Shoulder-L DG: Results for orders placed during the hospital encounter of 12/25/21 DG Shoulder Left  Narrative CLINICAL DATA:  51 year old female with history of pain and swelling in the left shoulder.  EXAM: LEFT SHOULDER - 2+ VIEW  COMPARISON:  Left shoulder radiograph 12/05/2021.  FINDINGS: There is no evidence of fracture or dislocation. There is no evidence of arthropathy or other focal bone abnormality. Soft tissues are unremarkable.  IMPRESSION: Negative.   Electronically Signed By: Vinnie Langton M.D. On: 12/25/2021 08:03  Lumbosacral Imaging: Lumbar MR wo contrast: Results for orders placed in visit on 09/24/21 MR Lumbar Spine Wo Contrast  Narrative CLINICAL DATA:  Low back pain  EXAM: MRI LUMBAR SPINE WITHOUT CONTRAST  TECHNIQUE: Multiplanar, multisequence MR imaging of the lumbar spine was performed. No intravenous contrast was administered.  COMPARISON:  02/08/2017  FINDINGS: Segmentation:  Standard.  Alignment:  Mild levocurvature.  No listhesis.  Vertebrae:  No acute fracture or suspicious osseous lesion.  Conus medullaris and cauda equina: Conus extends to the L2 level. Again noted is mild fatty infiltration of the  filum terminale without tethered cord. Conus and cauda equina appear otherwise normal.  Paraspinal and other soft tissues: No acute finding.  Disc levels:  T12-L1: No significant disc bulge. No spinal canal stenosis or neural foraminal narrowing.  L1-L2: No significant disc bulge. No spinal canal stenosis or neural foraminal narrowing.  L2-L3: No significant disc bulge. No spinal canal stenosis or neural foraminal narrowing.  L3-L4: No significant disc bulge. No spinal canal stenosis or neural foraminal narrowing.  L4-L5: No significant disc bulge. No spinal canal stenosis or neural foraminal narrowing.  L5-S1: Disc desiccation and disc height loss with mild left eccentric disc bulge. No spinal canal stenosis or neural foraminal narrowing.  IMPRESSION: Unchanged mild disc degeneration L5-S1 without spinal canal stenosis or neural foraminal narrowing.   Electronically Signed By: Merilyn Baba M.D. On: 09/25/2021 23:26  Lumbar CT wo contrast: Results  for orders placed during the hospital encounter of 09/20/21 CT Lumbar Spine Wo Contrast  Narrative CLINICAL DATA:  Lumbar radiculopathy.  Low back pain  EXAM: CT LUMBAR SPINE WITHOUT CONTRAST  TECHNIQUE: Multidetector CT imaging of the lumbar spine was performed without intravenous contrast administration. Multiplanar CT image reconstructions were also generated.  RADIATION DOSE REDUCTION: This exam was performed according to the departmental dose-optimization program which includes automated exposure control, adjustment of the mA and/or kV according to patient size and/or use of iterative reconstruction technique.  COMPARISON:  Lumbar MRI 02/08/2017  FINDINGS: Segmentation: 5 lumbar type vertebrae  Alignment: Normal  Vertebrae: No acute fracture or focal pathologic process.  Paraspinal and other soft tissues: Negative for perispinal mass or inflammation  Disc levels: Focal L5-S1 disc narrowing and mild  bulging, similar to prior MRI. No visible herniation or impingement  IMPRESSION: Focal L5-S1 disc degeneration, also seen by MRI in 2018. No acute finding or visible impingement.   Electronically Signed By: Jorje Guild M.D. On: 09/20/2021 10:24  Lumbar DG (Complete) 4+V: Results for orders placed during the hospital encounter of 09/20/21 DG Lumbar Spine Complete  Narrative CLINICAL DATA:  Low back pain for 2 weeks.  EXAM: LUMBAR SPINE - COMPLETE 4+ VIEW  COMPARISON:  None.  FINDINGS: There is no evidence of lumbar spine fracture. Alignment is normal. Intervertebral disc spaces are maintained.  IMPRESSION: Negative.   Electronically Signed By: Misty Stanley M.D. On: 09/20/2021 07:17  Sacroiliac Joint Imaging: Sacroiliac Joint DG: Results for orders placed in visit on 10/07/21 DG Si Joints  Narrative CLINICAL DATA:  Chronic low back pain  EXAM: BILATERAL SACROILIAC JOINTS - 3+ VIEW  COMPARISON:  None Available.  FINDINGS: The sacroiliac joint spaces are maintained and there is no evidence of arthropathy. No other bone abnormalities are seen.  IMPRESSION: Negative.   Electronically Signed By: Jacqulynn Cadet M.D. On: 10/10/2021 15:25  Hip Imaging: Hip-R DG 2-3 views: Results for orders placed during the hospital encounter of 02/08/17 DG Hip Unilat W or Wo Pelvis 2-3 Views Right  Narrative CLINICAL DATA:  51 y/o  F; right hip and thigh pain.  No injury.  EXAM: DG HIP (WITH OR WITHOUT PELVIS) 2-3V RIGHT  COMPARISON:  None.  FINDINGS: There is no evidence of hip fracture or dislocation. There is no evidence of arthropathy or other focal bone abnormality.  IMPRESSION: No acute bony articular abnormality identified.   Electronically Signed By: Kristine Garbe M.D. On: 02/08/2017 06:44  Knee Imaging: Knee-R MR wo contrast: Results for orders placed during the hospital encounter of 03/29/19 MR Knee Right Wo  Contrast  Narrative CLINICAL DATA:  Chronic knee pain.  EXAM: MRI OF THE RIGHT KNEE WITHOUT CONTRAST  TECHNIQUE: Multiplanar, multisequence MR imaging of the knee was performed. No intravenous contrast was administered.  COMPARISON:  None.  FINDINGS: MENISCI  Medial meniscus:  Intact  Lateral meniscus:  Intact  LIGAMENTS  Cruciates:  Intact  Collaterals:  Intact  CARTILAGE  Patellofemoral:  Minimal degenerative chondrosis.  Medial: Mild degenerative chondrosis mainly involving the medial femoral condyle articular cartilage. No joint space narrowing or spurring.  Lateral:  Minimal degenerative chondrosis.  Joint:  No joint effusion.  Popliteal Fossa:  No popliteal mass or Baker's cyst.  Extensor Mechanism: The patella retinacular structures are intact and the quadriceps and patellar tendons are intact. Normal orientation of the patella in the femoral trochlear groove.  Bones:  No acute bony findings.  Other: Normal knee musculature.  IMPRESSION:  1. Intact ligamentous structures and no acute bony findings. 2. No meniscal tears. 3. Mild tricompartmental degenerative chondrosis but no full-thickness cartilage defects or osteochondral lesion. 4. No joint effusion or Baker's cyst.   Electronically Signed By: Marijo Sanes M.D. On: 03/30/2019 14:48  Foot Imaging: Foot-L DG Complete: Results for orders placed during the hospital encounter of 10/21/08 DG Foot Complete Left  Narrative Clinical Data: Left hip pain, great toe swelling  LEFT FOOT - COMPLETE 3+ VIEW  Comparison: None  Findings: Bone mineralization normal. Joint spaces preserved. No acute fracture, dislocation, or bone destruction. Accessory ossicles at lateral margin of calcaneocuboid joint.  IMPRESSION: No acute bony abnormalities.  Provider: Brooke Dare  Hand Imaging: Hand-R DG Complete: Results for orders placed in visit on 04/11/02 DG Hand Complete  Right  Narrative FINDINGS CLINICAL DATA:    FELL - PAIN BETWEEN FIRST AND SECOND DIGITS. RIGHT HAND, COMPLETE THERE MAY BE SOME SOFT TISSUE SWELLING IN THE THENAR REGION BUT NO UNDERLYING FRACTURE OR DISLOCATION. IMPRESSION NO ACUTE BONY ABNORMALITY.  Complexity Note: Imaging results reviewed.                         ROS  Cardiovascular: High blood pressure Pulmonary or Respiratory: Smoking Neurological: No reported neurological signs or symptoms such as seizures, abnormal skin sensations, urinary and/or fecal incontinence, being born with an abnormal open spine and/or a tethered spinal cord Psychological-Psychiatric: No reported psychological or psychiatric signs or symptoms such as difficulty sleeping, anxiety, depression, delusions or hallucinations (schizophrenial), mood swings (bipolar disorders) or suicidal ideations or attempts Gastrointestinal: No reported gastrointestinal signs or symptoms such as vomiting or evacuating blood, reflux, heartburn, alternating episodes of diarrhea and constipation, inflamed or scarred liver, or pancreas or irrregular and/or infrequent bowel movements Genitourinary: No reported renal or genitourinary signs or symptoms such as difficulty voiding or producing urine, peeing blood, non-functioning kidney, kidney stones, difficulty emptying the bladder, difficulty controlling the flow of urine, or chronic kidney disease Hematological: No reported hematological signs or symptoms such as prolonged bleeding, low or poor functioning platelets, bruising or bleeding easily, hereditary bleeding problems, low energy levels due to low hemoglobin or being anemic Endocrine: No reported endocrine signs or symptoms such as high or low blood sugar, rapid heart rate due to high thyroid levels, obesity or weight gain due to slow thyroid or thyroid disease Rheumatologic: No reported rheumatological signs and symptoms such as fatigue, joint pain, tenderness, swelling, redness,  heat, stiffness, decreased range of motion, with or without associated rash Musculoskeletal: Negative for myasthenia gravis, muscular dystrophy, multiple sclerosis or malignant hyperthermia Work History: Working full time  Allergies  [name] is allergic to xanax [alprazolam].  Laboratory Chemistry Profile   Renal Lab Results  Component Value Date   BUN 7 10/28/2021   CREATININE 0.84 10/28/2021   GFR 84.76 07/01/2021   GFRAA >60 08/03/2017   GFRNONAA >60 10/28/2021   SPECGRAV 1.025 09/15/2021   PHUR 6.5 09/15/2021   PROTEINUR NEGATIVE 10/28/2021     Electrolytes Lab Results  Component Value Date   NA 143 10/28/2021   K 3.5 10/28/2021   CL 112 (H) 10/28/2021   CALCIUM 9.3 10/28/2021     Hepatic Lab Results  Component Value Date   AST 20 07/01/2021   ALT 22 07/01/2021   ALBUMIN 4.3 07/01/2021   ALKPHOS 65 07/01/2021   LIPASE 36 07/14/2014     ID Lab Results  Component Value Date   PREGTESTUR  07/03/2009    NEGATIVE        THE SENSITIVITY OF THIS METHODOLOGY IS >24 mIU/mL     Bone No results found for: "VD25OH", "VD125OH2TOT", "[id number]", "BO1751WC5", "25OHVITD1", "25OHVITD2", "25OHVITD3", "TESTOFREE", "TESTOSTERONE"   Endocrine Lab Results  Component Value Date   GLUCOSE 87 10/28/2021   GLUCOSEU NEGATIVE 10/28/2021   HGBA1C 5.8 07/01/2021   TSH 0.71 07/01/2021     Neuropathy Lab Results  Component Value Date   HGBA1C 5.8 07/01/2021     CNS No results found for: "COLORCSF", "APPEARCSF", "RBCCOUNTCSF", "WBCCSF", "POLYSCSF", "LYMPHSCSF", "EOSCSF", "PROTEINCSF", "GLUCCSF", "JCVIRUS", "CSFOLI", "IGGCSF", "LABACHR", "ACETBL"   Inflammation (CRP: Acute  ESR: Chronic) Lab Results  Component Value Date   ESRSEDRATE 17 03/12/2020     Rheumatology Lab Results  Component Value Date   RF <14 03/12/2020   ANA NEGATIVE 03/12/2020   LABURIC 3.9 03/12/2020     Coagulation Lab Results  Component Value Date   PLT 443 (H) 10/28/2021   DDIMER   04/11/2008    0.24        AT THE INHOUSE ESTABLISHED CUTOFF VALUE OF 0.48 ug/mL FEU, THIS ASSAY HAS BEEN DOCUMENTED IN THE LITERATURE TO HAVE     Cardiovascular Lab Results  Component Value Date   TROPONINI <0.03 07/14/2014   HGB 13.3 10/28/2021   HCT 40.7 10/28/2021     Screening Lab Results  Component Value Date   PREGTESTUR  07/03/2009    NEGATIVE        THE SENSITIVITY OF THIS METHODOLOGY IS >24 mIU/mL     Cancer No results found for: "CEA", "CA125", "LABCA2"   Allergens No results found for: "ALMOND", "APPLE", "ASPARAGUS", "AVOCADO", "BANANA", "BARLEY", "BASIL", "BAYLEAF", "GREENBEAN", "LIMABEAN", "WHITEBEAN", "BEEFIGE", "REDBEET", "BLUEBERRY", "BROCCOLI", "CABBAGE", "MELON", "CARROT", "CASEIN", "CASHEWNUT", "CAULIFLOWER", "CELERY"     Note: Lab results reviewed.  PFSH  Drug: [name]  reports no history of drug use. Alcohol:  reports that she does not currently use alcohol. Tobacco:  reports that she has been smoking cigarettes. She has been smoking an average of .1 packs per day. She has never used smokeless tobacco. Medical:  has a past medical history of Gout, Hypertension, and Migraines. Family: family history includes Healthy in her father; Hypertension in her mother.  Past Surgical History:  Procedure Laterality Date   ABDOMINAL HYSTERECTOMY  2008   Active Ambulatory Problems    Diagnosis Date Noted   Essential hypertension 05/14/2018   Tobacco abuse 05/14/2018   Intractable migraine without status migrainosus 05/14/2018   Gout 05/14/2018   Patellofemoral arthritis of knee (Right) 06/21/2018   Cervical radiculopathy 06/21/2018   Osteoarthritis of knee (Right) 11/05/2020   PTSD (post-traumatic stress disorder) 05/05/2021   Strain of lumbar region 05/05/2021   Situational anxiety 05/05/2021   Snoring 09/30/2021   Daytime sleepiness 09/30/2021   Prediabetes 09/30/2021   Tooth abscess 09/30/2021   Encounter for screening mammogram for malignant  neoplasm of breast 09/30/2021   HLD (hyperlipidemia) 09/30/2021   Chronic pain syndrome 01/01/2022   Pharmacologic therapy 01/01/2022   Disorder of skeletal system 01/01/2022   Problems influencing health status 01/01/2022   Abnormal MRI, lumbar spine (09/25/2021) 01/02/2022   Chronic hip pain (2ry area of Pain) (Left) 01/02/2022   Chronic low back pain (1ry area of Pain) (Left) w/o sciatica 01/02/2022   Chronic groin pain (3ry area of Pain) (Left) 01/02/2022   Greater trochanteric bursitis (4th area of Pain) (Left) 01/02/2022   Resolved Ambulatory Problems    Diagnosis Date  Noted   Cough 09/19/2019   Chronic sciatica (Left) 09/30/2021   Past Medical History:  Diagnosis Date   Hypertension    Migraines    Constitutional Exam  General appearance: Well nourished, well developed, and well hydrated. In no apparent acute distress Vitals:   01/02/22 1354  BP: (!) 145/95  Pulse: 91  Temp: (!) 97 F (36.1 C)  SpO2: 100%  Weight: 173 lb (78.5 kg)  Height: _0  (1.702 m)   BMI Assessment: Estimated body mass index is 27.1 kg/m as calculated from the following:   Height as of this encounter: _1  (1.702 m).   Weight as of this encounter: 173 lb (78.5 kg).  BMI interpretation table: BMI level Category Range association with higher incidence of chronic pain  <18 kg/m2 Underweight   18.5-24.9 kg/m2 Ideal body weight   25-29.9 kg/m2 Overweight Increased incidence by 20%  30-34.9 kg/m2 Obese (Class I) Increased incidence by 68%  35-39.9 kg/m2 Severe obesity (Class II) Increased incidence by 136%  >40 kg/m2 Extreme obesity (Class III) Increased incidence by 254%   Patient's current BMI Ideal Body weight  Body mass index is 27.1 kg/m. Ideal body weight: 61.6 kg (135 lb 12.9 oz) Adjusted ideal body weight: 68.3 kg (150 lb 10.9 oz)   BMI Readings from Last 4 Encounters:  01/02/22 27.10 kg/m  11/02/21 27.25 kg/m  10/27/21 26.78 kg/m  10/07/21 25.69 kg/m   Wt Readings from  Last 4 Encounters:  01/02/22 173 lb (78.5 kg)  11/02/21 174 lb (78.9 kg)  10/27/21 171 lb (77.6 kg)  10/07/21 164 lb (74.4 kg)    Psych/Mental status: Alert, oriented x 3 (person, place, & time)       Eyes: PERLA Respiratory: No evidence of acute respiratory distress  Assessment  Primary Diagnosis & Pertinent Problem List: The primary encounter diagnosis was Chronic pain syndrome. Diagnoses of Chronic low back pain (1ry area of Pain) (Left) w/o sciatica, Chronic hip pain (2ry area of Pain) (Left), Chronic groin pain (3ry area of Pain) (Left), Greater trochanteric bursitis (4th area of Pain) (Left), Abnormal MRI, lumbar spine (09/25/2021), Pharmacologic therapy, Disorder of skeletal system, and Problems influencing health status were also pertinent to this visit.  Visit Diagnosis (New problems to examiner): 1. Chronic pain syndrome   2. Chronic low back pain (1ry area of Pain) (Left) w/o sciatica   3. Chronic hip pain (2ry area of Pain) (Left)   4. Chronic groin pain (3ry area of Pain) (Left)   5. Greater trochanteric bursitis (4th area of Pain) (Left)   6. Abnormal MRI, lumbar spine (09/25/2021)   7. Pharmacologic therapy   8. Disorder of skeletal system   9. Problems influencing health status    Plan of Care (Initial workup plan)  Note: [name] was reminded that as per protocol, today's visit has been an evaluation only. We have not taken over the patient's controlled substance management.  Problem-specific plan: No problem-specific Assessment & Plan notes found for this encounter.  Lab Orders         Compliance Drug Analysis, Ur         Comp. Metabolic Panel (12)         Magnesium         Vitamin B12         Sedimentation rate         25-Hydroxy vitamin D Lcms D2+D3         C-reactive protein     Imaging Orders  DG HIP UNILAT W OR W/O PELVIS 2-3 VIEWS LEFT         DG Lumbar Spine Complete W/Bend     Referral Orders  No referral(s) requested today    Procedure Orders    No procedure(s) ordered today   Pharmacotherapy (current): Medications ordered:  No orders of the defined types were placed in this encounter.  Medications administered during this visit: Toia D. Rothman had no medications administered during this visit.   Pharmacological management options:  Opioid Analgesics: The patient was informed that there is no guarantee that she would be a candidate for opioid analgesics. The decision will be made following CDC guidelines. This decision will be based on the results of diagnostic studies, as well as [name]'s risk profile.   Membrane stabilizer: To be determined at a later time  Muscle relaxant: To be determined at a later time  NSAID: To be determined at a later time  Other analgesic(s): To be determined at a later time   Interventional management options: [name] was informed that there is no guarantee that she would be a candidate for interventional therapies. The decision will be based on the results of diagnostic studies, as well as [name]'s risk profile.  Procedure(s) under consideration:  Pending results of ordered studies      Interventional Therapies  Risk  Complexity Considerations:   Estimated body mass index is 27.1 kg/m as calculated from the following:   Height as of this encounter: _0  (1.702 m).   Weight as of this encounter: 173 lb (78.5 kg). WNL   Planned  Pending:   Diagnostic x-rays of left hip  Diagnostic x-rays of lumbar spine w/ bending  Diagnostic lab work    Under consideration:   Diagnostic left IA hip joint inj. #1    Completed:   None at this time   Completed by other providers:   Diagnostic left SI joint inj. x1 (10/27/2021) by Glennon Mac, DO ([facility]) (Flare-up of pain after inj.)    Therapeutic  Palliative (PRN) options:   None established    Provider-requested follow-up: Return for (4mn), Eval-day (M,W), (F2F), 2nd Visit, for review of  ordered tests.  Future Appointments  Date Time Provider [place] 01/05/2022  4:00 PM GAilene Ards NP [facility] None    Note by: FGaspar Cola MD Date: 01/02/2022; Time: 3:26 PM

## 2022-01-02 ENCOUNTER — Ambulatory Visit (HOSPITAL_BASED_OUTPATIENT_CLINIC_OR_DEPARTMENT_OTHER): Payer: Commercial Managed Care - HMO | Admitting: Pain Medicine

## 2022-01-02 ENCOUNTER — Encounter: Payer: Self-pay | Admitting: Pain Medicine

## 2022-01-02 ENCOUNTER — Ambulatory Visit
Admission: RE | Admit: 2022-01-02 | Discharge: 2022-01-02 | Disposition: A | Discharge status: Home / Self Care | Payer: Commercial Managed Care - HMO | Source: Ambulatory Visit | Attending: Pain Medicine | Admitting: Pain Medicine

## 2022-01-02 VITALS — BP 145/95 | HR 91 | Temp 97.0°F | Ht 67.0 in | Wt 173.0 lb

## 2022-01-02 DIAGNOSIS — M545 Low back pain, unspecified: Secondary | ICD-10-CM

## 2022-01-02 DIAGNOSIS — G894 Chronic pain syndrome: Secondary | ICD-10-CM | POA: Diagnosis not present

## 2022-01-02 DIAGNOSIS — M899 Disorder of bone, unspecified: Secondary | ICD-10-CM | POA: Insufficient documentation

## 2022-01-02 DIAGNOSIS — R1032 Left lower quadrant pain: Secondary | ICD-10-CM | POA: Insufficient documentation

## 2022-01-02 DIAGNOSIS — G8929 Other chronic pain: Secondary | ICD-10-CM | POA: Insufficient documentation

## 2022-01-02 DIAGNOSIS — R937 Abnormal findings on diagnostic imaging of other parts of musculoskeletal system: Secondary | ICD-10-CM | POA: Insufficient documentation

## 2022-01-02 DIAGNOSIS — M25552 Pain in left hip: Secondary | ICD-10-CM | POA: Insufficient documentation

## 2022-01-02 DIAGNOSIS — Z79899 Other long term (current) drug therapy: Secondary | ICD-10-CM | POA: Insufficient documentation

## 2022-01-02 DIAGNOSIS — Z789 Other specified health status: Secondary | ICD-10-CM | POA: Insufficient documentation

## 2022-01-02 DIAGNOSIS — M7062 Trochanteric bursitis, left hip: Secondary | ICD-10-CM | POA: Insufficient documentation

## 2022-01-02 NOTE — Patient Instructions (Addendum)

## 2022-01-02 NOTE — Progress Notes (Signed)
Safety precautions to be maintained throughout the outpatient stay will include: orient to surroundings, keep bed in low position, maintain call bell within reach at all times, provide assistance with transfer out of bed and ambulation.  

## 2022-01-05 ENCOUNTER — Ambulatory Visit (INDEPENDENT_AMBULATORY_CARE_PROVIDER_SITE_OTHER): Payer: Commercial Managed Care - HMO | Admitting: Nurse Practitioner

## 2022-01-05 VITALS — BP 130/86 | HR 87 | Temp 97.9°F | Ht 67.0 in | Wt 171.0 lb

## 2022-01-05 DIAGNOSIS — M25512 Pain in left shoulder: Secondary | ICD-10-CM | POA: Diagnosis not present

## 2022-01-05 DIAGNOSIS — K5900 Constipation, unspecified: Secondary | ICD-10-CM

## 2022-01-05 DIAGNOSIS — Z1211 Encounter for screening for malignant neoplasm of colon: Secondary | ICD-10-CM

## 2022-01-05 LAB — COMPLIANCE DRUG ANALYSIS, UR

## 2022-01-05 MED ORDER — SENNA-DOCUSATE SODIUM 8.6-50 MG PO TABS: 2.0000 | ORAL_TABLET | Freq: Every day | ORAL | 2 refills | Status: DC

## 2022-01-05 NOTE — Assessment & Plan Note (Signed)
Referral to GI made today. Patient told to call if she has not heard from their office within 7-10 days.

## 2022-01-05 NOTE — Progress Notes (Signed)
Established Patient Office Visit  Subjective   Patient ID: [name], female    DOB: 1970-08-04  Age: 51 y.o. MRN: [id number]  Chief Complaint  Patient presents with   Possible constipation   left shoulder concern    Lifted herself with left arm and heard a pop    Pain in left side    Still ongoing   Left shoulder pain: Reports about 1 month ago she was trying to lift herself up off of her bed and heard a pop in her left shoulder.  Since then she has been experiencing pain with shoulder flexion, abduction, and internal rotation.  Strength and sensation is intact.  She has been evaluated in emergency department and was recommended to follow-up with orthopedic specialist.  She reports that this is to cause prohibitive and would like to discuss treatment options with me.  She does report some slight improvement over the last month.  Constipation: She reports having a bowel movement once every 2 to 3 weeks.  Denies any abdominal pain or blood in her stool.  She reports this is new over the last 2 months. Reports last BM was this morning.  Left Hip Pain: She is being evaluated by pain management for assistance with managing her left hip pain.    Review of Systems  Respiratory:  Negative for shortness of breath.   Cardiovascular:  Negative for chest pain.  Gastrointestinal:  Positive for constipation. Negative for abdominal pain, blood in stool and diarrhea.  Musculoskeletal:  Positive for joint pain.      Objective:     BP 130/86 (BP Location: Right Arm, Patient Position: Sitting, Cuff Size: Large)   Pulse 87   Temp 97.9 F (36.6 C) (Oral)   Ht 5\' 7"  (1.702 m)   Wt 171 lb (77.6 kg)   SpO2 97%   BMI 26.78 kg/m  BP Readings from Last 3 Encounters:  01/05/22 130/86  01/02/22 (!) 145/95  12/25/21 (!) 130/95   Wt Readings from Last 3 Encounters:  01/05/22 171 lb (77.6 kg)  01/02/22 173 lb (78.5 kg)  11/02/21 174 lb (78.9 kg)      Physical Exam Vitals  reviewed.  Constitutional:      General: She is not in acute distress.    Appearance: Normal appearance.  HENT:     Head: Normocephalic and atraumatic.  Neck:     Vascular: No carotid bruit.  Cardiovascular:     Rate and Rhythm: Normal rate and regular rhythm.     Pulses: Normal pulses.     Heart sounds: Normal heart sounds.  Pulmonary:     Effort: Pulmonary effort is normal.     Breath sounds: Normal breath sounds.  Abdominal:     General: Abdomen is flat. Bowel sounds are normal.     Palpations: Abdomen is soft.     Tenderness: There is no abdominal tenderness.  Musculoskeletal:     Left shoulder: Crepitus present. No swelling, effusion, laceration, tenderness or bony tenderness. Decreased range of motion (to forward flexion, abduction, and internal rotation due to pain). Normal strength. Normal pulse.  Skin:    General: Skin is warm and dry.  Neurological:     General: No focal deficit present.     Mental Status: She is alert and oriented to person, place, and time.  Psychiatric:        Mood and Affect: Mood normal.        Behavior: Behavior normal.  Judgment: Judgment normal.      No results found for any visits on 01/05/22.    The 10-year ASCVD risk score (Arnett DK, et al., 2019) is: 8.3%    Assessment & Plan:   Problem List Items Addressed This Visit       Other   Acute pain of left shoulder - Primary    Ongoing for approximately 1 month now. I agree with previous providers that patient needs evaluation with orthopedic specialist. Patient reports that she is willing to do this now. Referral to orthopedic surgeon ordered today.      Relevant Orders   Ambulatory referral to Orthopedic Surgery   Constipation    Ongoing for 2 months. Abdominal exam without red flags. Recommend continue miralax daily add Senna-S twice a day until she is having regular, easy to pass bowel movements. Refer to GI to rule out other abnormalities that may be contributing.        Relevant Medications   sennosides-docusate sodium (SENOKOT-S) 8.6-50 MG tablet   Other Relevant Orders   Ambulatory referral to Gastroenterology   Colon cancer screening    Referral to GI made today. Patient told to call if she has not heard from their office within 7-10 days.       Relevant Orders   Ambulatory referral to Gastroenterology    Return in about 3 months (around 04/07/2022) for F/U with Jjesus Dingley.    Elenore Paddy, NP

## 2022-01-05 NOTE — Assessment & Plan Note (Signed)
Ongoing for 2 months. Abdominal exam without red flags. Recommend continue miralax daily add Senna-S twice a day until she is having regular, easy to pass bowel movements. Refer to GI to rule out other abnormalities that may be contributing.

## 2022-01-05 NOTE — Assessment & Plan Note (Signed)
Ongoing for approximately 1 month now. I agree with previous providers that patient needs evaluation with orthopedic specialist. Patient reports that she is willing to do this now. Referral to orthopedic surgeon ordered today.

## 2022-01-06 LAB — COMP. METABOLIC PANEL (12)
AST: 14 IU/L (ref 0–40)
Albumin/Globulin Ratio: 1.5 (ref 1.2–2.2)
Albumin: 4.5 g/dL (ref 3.9–4.9)
Alkaline Phosphatase: 82 IU/L (ref 44–121)
BUN/Creatinine Ratio: 17 (ref 9–23)
BUN: 14 mg/dL (ref 6–24)
Bilirubin Total: <0.2 mg/dL (ref 0.0–1.2)
Calcium: 9.2 mg/dL (ref 8.7–10.2)
Chloride: 104 mmol/L (ref 96–106)
Creatinine, Ser: 0.83 mg/dL (ref 0.57–1.00)
Globulin, Total: 3 g/dL (ref 1.5–4.5)
Glucose: 100 mg/dL — ABNORMAL HIGH (ref 70–99)
Potassium: 3.8 mmol/L (ref 3.5–5.2)
Sodium: 138 mmol/L (ref 134–144)
Total Protein: 7.5 g/dL (ref 6.0–8.5)
eGFR: 86 mL/min/{1.73_m2} (ref >59)

## 2022-01-06 LAB — SEDIMENTATION RATE: Sed Rate: 31 mm/hr (ref 0–40)

## 2022-01-06 LAB — VITAMIN B12: Vitamin B-12: 874 pg/mL (ref 232–1245)

## 2022-01-06 LAB — 25-HYDROXY VITAMIN D LCMS D2+D3
25-Hydroxy, Vitamin D-2: <1 ng/mL
25-Hydroxy, Vitamin D-3: 15 ng/mL
25-Hydroxy, Vitamin D: 15 ng/mL — ABNORMAL LOW

## 2022-01-06 LAB — C-REACTIVE PROTEIN: CRP: 1 mg/L (ref 0–10)

## 2022-01-06 LAB — MAGNESIUM: Magnesium: 2.2 mg/dL (ref 1.6–2.3)

## 2022-01-16 ENCOUNTER — Telehealth: Payer: Self-pay

## 2022-01-16 NOTE — Telephone Encounter (Signed)
Pt is asking for a letter of accomodation to sit down during her work day due to Degenerative joint disease of Left hip.  Claim # [id number]  Please advise

## 2022-01-19 NOTE — Telephone Encounter (Signed)
I called pt an advised her of August Saucer, NP) recommendation. Pt will drop off pw today to Dr. Jean Rosenthal in Sports Medicine.

## 2022-02-01 ENCOUNTER — Other Ambulatory Visit: Payer: Self-pay | Admitting: Emergency Medicine

## 2022-02-01 DIAGNOSIS — I10 Essential (primary) hypertension: Secondary | ICD-10-CM

## 2022-02-01 NOTE — Telephone Encounter (Signed)
Please send to PCP Jiles Prows, NP.  Thanks

## 2022-02-09 ENCOUNTER — Ambulatory Visit: Payer: Commercial Managed Care - HMO | Admitting: Sports Medicine

## 2022-02-09 NOTE — Progress Notes (Unsigned)
Aleen Sells D.[name] [address] [place] [address] Phone: [phone]   Assessment and Plan:     There are no diagnoses linked to this encounter.  ***   Pertinent previous records reviewed include ***   Follow Up: ***     Subjective:   I, Shlok Raz, am serving as a Neurosurgeon for Doctor Richardean Sale   Chief Complaint: low back pain    HPI:  06/17/2021 Patient is is 51 year old female complaining of left sided low back pain. Patient states that she has had 1 week of severe left low back pain that has progressed over the last day to include weakness and severe pain down the left leg with new onset numbness and tingling as well as weakness radiating into the L foot.  PCP who prescribed Flexeril and meloxicam with have not helped alleviate her pain.  She is also tried [place] powder, ice and heat which did not help. No MOI No shots no surgery conservative treatment preferred    07/15/2021 Patient states that she would like a referral to the [facility] , pain in the inguinal area that wraps from the low back pain when she stands and puts pressure on it, received a doctors note from dr gray so she can sit and stand , HEP has been doing them causes pain sometimes, Monday night was the worst pain, would like to discuss MRI    08/08/2021 Patient states that she's working it out , still having flare ups    08/31/2021 Patient states everything is good,  it hurts when she over works herself and it only hurts on her left hip and she can feel it coming on stopped going to Pt cause it wasn't doing anything wants her mg in muscle relaxer upped    09/20/2021 Patient states that the pain has gotten worse wen to ER today an was given oxycodone- acetaminophen, pain is the same in the low back down to her butt and wraps around her groin area, today is a bad flare has been like this for the last 2 days went to PCP and was given a shot in her booty     10/07/2021 Patient states she is still in pain doing the same    10/27/2021 Patient states still in a lot of pain, is unable to get comfy   in bed and has to wiggle her way to get in and out of bed   6/7/202 Patient states OMT was not it, had to go to ER the next day was not able to sleep, will send in a new referral for pain management, has been working out and stretching    02/14/2022 Patient states   Relevant Historical Information: Hypertension, OA right knee  Additional pertinent review of systems negative.   Current Outpatient Medications:    amLODipine (NORVASC) 10 MG tablet, TAKE 1 TABLET(10 MG) BY MOUTH DAILY, Disp: 90 tablet, Rfl: 3   meloxicam (MOBIC) 7.5 MG tablet, Take 1 tablet (7.5 mg total) by mouth daily., Disp: 10 tablet, Rfl: 0   polyethylene glycol (MIRALAX / GLYCOLAX) 17 g packet, Take 17 g by mouth daily., Disp: , Rfl:    RESTASIS 0.05 % ophthalmic emulsion, 1 drop 2 (two) times daily., Disp: , Rfl:    sennosides-docusate sodium (SENOKOT-S) 8.6-50 MG tablet, Take 2 tablets by mouth daily., Disp: 120 tablet, Rfl: 2   topiramate (TOPAMAX) 25 MG tablet, Take 1 tablet (25 mg  total) by mouth 2 (two) times daily., Disp: 180 tablet, Rfl: 3   Objective:     There were no vitals filed for this visit.    There is no height or weight on file to calculate BMI.    Physical Exam:    ***   Electronically signed by:  Aleen Sells D[name] 10:09 AM 02/09/22

## 2022-02-14 ENCOUNTER — Ambulatory Visit (INDEPENDENT_AMBULATORY_CARE_PROVIDER_SITE_OTHER): Payer: Commercial Managed Care - HMO | Admitting: Sports Medicine

## 2022-02-14 VITALS — BP 130/86 | HR 91 | Ht 67.0 in | Wt 168.0 lb

## 2022-02-14 DIAGNOSIS — M533 Sacrococcygeal disorders, not elsewhere classified: Secondary | ICD-10-CM | POA: Diagnosis not present

## 2022-02-14 DIAGNOSIS — M545 Low back pain, unspecified: Secondary | ICD-10-CM | POA: Diagnosis not present

## 2022-02-14 DIAGNOSIS — G8929 Other chronic pain: Secondary | ICD-10-CM

## 2022-02-14 NOTE — Patient Instructions (Signed)
Good to see you   

## 2022-04-06 ENCOUNTER — Ambulatory Visit (INDEPENDENT_AMBULATORY_CARE_PROVIDER_SITE_OTHER): Payer: Commercial Managed Care - HMO | Admitting: Family Medicine

## 2022-04-06 ENCOUNTER — Encounter (HOSPITAL_BASED_OUTPATIENT_CLINIC_OR_DEPARTMENT_OTHER): Payer: Self-pay | Admitting: Family Medicine

## 2022-04-06 VITALS — BP 130/90 | HR 83 | Temp 97.7°F | Ht 67.0 in | Wt 167.2 lb

## 2022-04-06 DIAGNOSIS — M7502 Adhesive capsulitis of left shoulder: Secondary | ICD-10-CM

## 2022-04-06 MED ORDER — MELOXICAM 7.5 MG PO TABS: 7.5000 mg | ORAL_TABLET | Freq: Every day | ORAL | 0 refills | Status: DC

## 2022-04-06 NOTE — Progress Notes (Signed)
    Procedures performed today:    None.  Independent interpretation of notes and tests performed by another provider:   None.  Brief History, Exam, Impression, and Recommendations:    BP (!) 130/90   Pulse 83   Temp 97.7 F (36.5 C) (Oral)   Ht 5\' 7"  (1.702 m)   Wt 167 lb 3.2 oz (75.8 kg)   SpO2 100%   BMI 26.19 kg/m   Adhesive capsulitis of left shoulder Patient reports that a few months ago, she pushed herself up on her left elbow to sit up in bed and felt sudden shoulder pain at that time, also heard a pop.  She was seen at urgent care and emergency department locally with imaging completed which was generally unremarkable.  Additionally, she was seen at [facility], reports having 3 visits. Indicates that she was scheduled to have MRI completed however, insurance ended up denying this. She did not work with PT in the office. She did try home exercises. Has been using Naproxen, gabapentin. Denies any prior left shoulder issues. She is right-handed. She reports some numbness/tingling over the shoulder, none extending into distal arm.  On exam, patient is in no acute distress, vital signs stable.  She does have some tenderness to palpation through parascapular muscles, no tenderness to palpation over [facility] joint or bicipital groove.  She does have some restriction in range of motion both for active and passive external rotation, internal rotation, abduction.  She also has pain with empty can testing, Hawkins, Neer's.  Distal neurovascular exam is intact Exam seems most consistent with adhesive capsulitis.  Discussed nature of condition as well as recommended treatment including physical therapy, home exercise program, OTC medications to help with pain control.  We also discussed possibility of ultrasound-guided glenohumeral joint injection which can be considered for persistent pain. Referral placed to physical therapy downstairs here.  We will plan for follow-up in about 6 to 8 weeks to  monitor progress.  We can also consider ultrasound-guided glenohumeral joint injection pending progress and patient preference  Return in about 6 weeks (around 05/18/2022) for left shoulder pain.   ___________________________________________ Wade Sigala de 05/20/2022, MD, ABFM, [facility]

## 2022-04-06 NOTE — Patient Instructions (Signed)
  Medication Instructions:  Your physician recommends that you continue on your current medications as directed. Please refer to the Current Medication list given to you today. --If you need a refill on any your medications before your next appointment, please call your pharmacy first. If no refills are authorized on file call the office.-- Lab Work: Your physician has recommended that you have lab work today: No If you have labs (blood work) drawn today and your tests are completely normal, you will receive your results via MyChart message OR a phone call from our staff.  Please ensure you check your voicemail in the event that you authorized detailed messages to be left on a delegated number. If you have any lab test that is abnormal or we need to change your treatment, we will call you to review the results.  Referrals/Procedures/Imaging: Yes, Physical Therapy   Follow-Up: Your next appointment:   Your physician recommends that you schedule a follow-up appointment in: 6-8 weeks with Dr. de Cuba.  You will receive a text message or e-mail with a link to a survey about your care and experience with us today! We would greatly appreciate your feedback!   Thanks for letting us be apart of your health journey!!  Primary Care and Sports Medicine   Dr. Raymond de Cuba   We encourage you to activate your patient portal called "MyChart".  Sign up information is provided on this After Visit Summary.  MyChart is used to connect with patients for Virtual Visits (Telemedicine).  Patients are able to view lab/test results, encounter notes, upcoming appointments, etc.  Non-urgent messages can be sent to your provider as well. To learn more about what you can do with MyChart, please visit --  [email or web address].    

## 2022-04-06 NOTE — Assessment & Plan Note (Signed)
Patient reports that a few months ago, she pushed herself up on her left elbow to sit up in bed and felt sudden shoulder pain at that time, also heard a pop.  She was seen at urgent care and emergency department locally with imaging completed which was generally unremarkable.  Additionally, she was seen at [facility], reports having 3 visits. Indicates that she was scheduled to have MRI completed however, insurance ended up denying this. She did not work with PT in the office. She did try home exercises. Has been using Naproxen, gabapentin. Denies any prior left shoulder issues. She is right-handed. She reports some numbness/tingling over the shoulder, none extending into distal arm.  On exam, patient is in no acute distress, vital signs stable.  She does have some tenderness to palpation through parascapular muscles, no tenderness to palpation over [facility] joint or bicipital groove.  She does have some restriction in range of motion both for active and passive external rotation, internal rotation, abduction.  She also has pain with empty can testing, Hawkins, Neer's.  Distal neurovascular exam is intact Exam seems most consistent with adhesive capsulitis.  Discussed nature of condition as well as recommended treatment including physical therapy, home exercise program, OTC medications to help with pain control.  We also discussed possibility of ultrasound-guided glenohumeral joint injection which can be considered for persistent pain. Referral placed to physical therapy downstairs here.  We will plan for follow-up in about 6 to 8 weeks to monitor progress.  We can also consider ultrasound-guided glenohumeral joint injection pending progress and patient preference

## 2022-04-07 ENCOUNTER — Ambulatory Visit: Payer: Commercial Managed Care - HMO | Admitting: Nurse Practitioner

## 2022-05-04 ENCOUNTER — Ambulatory Visit (HOSPITAL_BASED_OUTPATIENT_CLINIC_OR_DEPARTMENT_OTHER): Payer: Commercial Managed Care - HMO | Attending: Family Medicine | Admitting: Physical Therapy

## 2022-06-06 ENCOUNTER — Ambulatory Visit (HOSPITAL_BASED_OUTPATIENT_CLINIC_OR_DEPARTMENT_OTHER): Payer: Commercial Managed Care - HMO | Admitting: Family Medicine

## 2022-06-06 DIAGNOSIS — M25512 Pain in left shoulder: Secondary | ICD-10-CM | POA: Diagnosis not present

## 2022-06-14 NOTE — Therapy (Signed)
OUTPATIENT PHYSICAL THERAPY SHOULDER EVALUATION   Patient Name: [name] MRN: [id number] DOB:February 08, 1971, 52 y.o., female Today's Date: 06/15/2022  END OF SESSION:  PT End of Session - 06/15/22 1603     Visit Number 1    Number of Visits 9    Date for PT Re-Evaluation 08/17/22    Authorization Type [employer]    Authorization Time Period FOTO v6, v10    PT Start Time 1525    PT Stop Time 1605    PT Time Calculation (min) 40 min    Activity Tolerance Patient tolerated treatment well    Behavior During Therapy WFL for tasks assessed/performed             Past Medical History:  Diagnosis Date   Gout    Hypertension    Migraines    Past Surgical History:  Procedure Laterality Date   ABDOMINAL HYSTERECTOMY  2008   Patient Active Problem List   Diagnosis Date Noted   Adhesive capsulitis of left shoulder 04/06/2022   Acute pain of left shoulder 01/05/2022   Constipation 01/05/2022   Colon cancer screening 01/05/2022   Abnormal MRI, lumbar spine (09/25/2021) 01/02/2022   Chronic hip pain (2ry area of Pain) (Left) 01/02/2022   Chronic low back pain (1ry area of Pain) (Left) w/o sciatica 01/02/2022   Chronic groin pain (3ry area of Pain) (Left) 01/02/2022   Greater trochanteric bursitis (4th area of Pain) (Left) 01/02/2022   Chronic pain syndrome 01/01/2022   Pharmacologic therapy 01/01/2022   Disorder of skeletal system 01/01/2022   Problems influencing health status 01/01/2022   Snoring 09/30/2021   Daytime sleepiness 09/30/2021   Prediabetes 09/30/2021   Tooth abscess 09/30/2021   Encounter for screening mammogram for malignant neoplasm of breast 09/30/2021   HLD (hyperlipidemia) 09/30/2021   PTSD (post-traumatic stress disorder) 05/05/2021   Strain of lumbar region 05/05/2021   Situational anxiety 05/05/2021   Osteoarthritis of knee (Right) 11/05/2020   Patellofemoral arthritis of knee (Right) 06/21/2018   Cervical radiculopathy 06/21/2018   Essential  hypertension 05/14/2018   Tobacco abuse 05/14/2018   Intractable migraine without status migrainosus 05/14/2018   Gout 05/14/2018    PCP: Elenore Paddy, NP   REFERRING PROVIDER: Vernetta Honey, PA-C   REFERRING DIAG: left shouder adhesive capsulitis   THERAPY DIAG:  Chronic left shoulder pain  Stiffness of left shoulder, not elsewhere classified  Muscle weakness (generalized)  Rationale for Evaluation and Treatment: Rehabilitation  ONSET DATE: 4 months ago  SUBJECTIVE:  SUBJECTIVE STATEMENT: Pt reports primary c/o Lt shoulder pain following an incident 4 months ago when she tried to push herself off the bed using her Lt elbow on the bed. She heard something pop at that time. Since then, she has not used her Lt arm. She reports she developed frozen shoulder. She also reports she received a steroid injection on 06/06/2022, which helped a lot with her pain. She reports she has been able to move her arm further overhead over the past week. Current pain is 6/10. Worst pain is 10/10. Best pain is 3/10. Aggravating factors include laying on Lt side, reaching overhead. Easing factors include rest, heating pad.    PERTINENT HISTORY: HTN, migraines  PAIN:  Are you having pain? Yes: NPRS scale: 6/10 Pain location: Lt shoulder Pain description: dull, achy Aggravating factors: laying on Lt side, reaching overhead Relieving factors: rest, heating pad  PRECAUTIONS: None  WEIGHT BEARING RESTRICTIONS: No  FALLS:  Has patient fallen in last 6 months? No  LIVING ENVIRONMENT: Lives with: lives alone Lives in: House/apartment Stairs: No Has following equipment at home: None  OCCUPATION: Unemployed  PLOF: Independent  PATIENT GOALS:Reach into cabinets, wash body, put on deodorant   OBJECTIVE:    DIAGNOSTIC FINDINGS:  12/25/2021: DG Shoulder Left: IMPRESSION: Negative.  PATIENT SURVEYS:  FOTO 44%, projected 63% in 13 visits  COGNITION: Overall cognitive status: Within functional limits for tasks assessed     SENSATION: Not tested  POSTURE: Forward head/ rounded shoulders  UPPER EXTREMITY ROM:   A/PROM Right eval Left eval  Shoulder flexion 165/175 125/130p! (Firm end feel)  Shoulder abduction 180 100/105p! (Firm end feel)  Shoulder internal rotation 75/85 30/42p! (Firm end feel)  Shoulder external rotation 80/90 25/30p! (Firm end feel)  (Blank rows = not tested)  UPPER EXTREMITY MMT:  MMT Right eval Left eval  Shoulder flexion 5/5 4/5p!  Shoulder abduction 5/5 4/5p!  Shoulder internal rotation 5/5 5/5  Shoulder external rotation 5/5 4/5p!  Middle trapezius 4+/5 4+/5  Lower trapezius 4/5 Unable to assume testing position  Latissimus dorsi 4+/5 4+/5  Grip strength (lbs) 80 65  (Blank rows = not tested)   JOINT MOBILITY TESTING:  Hypomobile GHJ mobilizations in AP/PA/inf with pain with AP  PALPATION:  TTP to global Lt GHJ, distal supraspinatus musculotendinous junction   TODAY'S TREATMENT:                                                                                                                                          [facility] Adult PT Treatment:                                                DATE: 06/14/2022 Demonstrated and issued HEP    PATIENT EDUCATION: Education  details: Pt educated on probable underlying pathophysiology, POC, prognosis, FOTO, and HEP Person educated: Patient Education method: Explanation, Demonstration, and Handouts Education comprehension: verbalized understanding and returned demonstration  HOME EXERCISE PROGRAM: Access Code: [email or web address] URL: [email or web address] Date: 06/15/2022 Prepared by: Vanessa [place]  Exercises - Shoulder Scaption AAROM with Dowel (Mirrored)  - 1 x daily - 7 x weekly - 3 sets  - 10 reps - 5 seconds hold - Standing Shoulder External Rotation AAROM with Dowel  - 1 x daily - 7 x weekly - 3 sets - 10 reps - 5 seconds hold - Standing Shoulder Internal Rotation Stretch with Towel (Mirrored)  - 1 x daily - 7 x weekly - 3 sets - 10 reps - 5 seconds hold - Standing Shoulder Row with Anchored Resistance  - 1 x daily - 7 x weekly - 3 sets - 10 reps - 3 seconds hold  ASSESSMENT:  CLINICAL IMPRESSION: Patient is a 52 y.o. F who was seen today for physical therapy evaluation and treatment for subacute Lt shoulder pain and stiffness.  Upon assessment, her primary impairments include painful and limited global Lt shoulder AROM and PROM, painful and weak global Lt shoulder MMT, hypomobile global Lt GHJ passive accessory mobility, and TTP to global Lt GHJ and distal supraspinatus musculotendinous junction. Ruling up Lt shoulder adhesive capsulitis due to limited global Lt shoulder AROM meeting PROM with firm end feel, hx of immobility following acute injury, and risk factors including female, African American, and age 31 to 79 years. Pt will benefit from skilled PT to address her primary impairments and return to her prior level of function with less limitation.  OBJECTIVE IMPAIRMENTS: decreased activity tolerance, decreased endurance, decreased mobility, decreased ROM, decreased strength, hypomobility, increased fascial restrictions, impaired flexibility, impaired UE functional use, improper body mechanics, postural dysfunction, and pain.   ACTIVITY LIMITATIONS: carrying, lifting, sleeping, transfers, bathing, toileting, dressing, and reach over head  PARTICIPATION LIMITATIONS: cleaning, laundry, driving, shopping, community activity, and yard work  PERSONAL FACTORS: 1-2 comorbidities: HTN, migraines  are also affecting patient's functional outcome.   REHAB POTENTIAL: Good  CLINICAL DECISION MAKING: Stable/uncomplicated  EVALUATION COMPLEXITY: Low   GOALS: Goals reviewed with  patient? Yes  SHORT TERM GOALS: Target date: 07/13/2022  Pt will report understanding and adherence to initial HEP in order to promote independence in the management of primary impairments. Baseline: HEP provided at eval Goal status: INITIAL    LONG TERM GOALS: Target date: 08/10/2022  Pt will achieve a FOTO score of 63% in order to demonstrate improved functional ability as it relates to the pt's primary impairments. Baseline: 44% Goal status: INITIAL  2.  Pt will achieve Lt shoulder flexion and abduction AROM of 150 degrees in order to reach into overhead cabinets with less limitation. Baseline: 125 flexion, 100 abduction Goal status: INITIAL  3.  Pt will achieve Lt shoulder IR and ER AROM of 50 degrees in order to wash back with less limitation. Baseline: IR: 30, ER: 25 Goal status: INITIAL  4.  Pt will achieve Lt shoulder MMT of 5/5 with 0-3/10 pain in order to progress her independent strengthening regimen with less limitation. Baseline: See MMT chart (7/10 pain with painful motions) Goal status: INITIAL  5.  Pt will report ability to lay on Lt side with 0-2/10 pain in order to get a good night's sleep. Baseline: Pt unable to lay on Lt side due to pain Goal status: INITIAL   PLAN:  PT FREQUENCY: 1x/week  PT DURATION: 8  weeks  PLANNED INTERVENTIONS: Therapeutic exercises, Therapeutic activity, Neuromuscular re-education, Balance training, Gait training, Patient/Family education, Self Care, Joint mobilization, Joint manipulation, Orthotic/Fit training, Aquatic Therapy, Dry Needling, Electrical stimulation, Spinal manipulation, Spinal mobilization, Cryotherapy, Moist heat, Taping, Vasopneumatic device, Biofeedback, Ionotophoresis 4mg /ml Dexamethasone, Manual therapy, and Re-evaluation  PLAN FOR NEXT SESSION: Progress GHJ mobilization/ stretching, early parascapular strengthening   Vanessa [place], PT, DPT 06/15/22 4:26 PM

## 2022-06-15 ENCOUNTER — Ambulatory Visit: Payer: 59 | Attending: Nurse Practitioner

## 2022-06-15 ENCOUNTER — Other Ambulatory Visit: Payer: Self-pay

## 2022-06-15 DIAGNOSIS — G8929 Other chronic pain: Secondary | ICD-10-CM | POA: Insufficient documentation

## 2022-06-15 DIAGNOSIS — M25612 Stiffness of left shoulder, not elsewhere classified: Secondary | ICD-10-CM | POA: Insufficient documentation

## 2022-06-15 DIAGNOSIS — M25512 Pain in left shoulder: Secondary | ICD-10-CM | POA: Diagnosis not present

## 2022-06-15 DIAGNOSIS — M6281 Muscle weakness (generalized): Secondary | ICD-10-CM | POA: Insufficient documentation

## 2022-06-21 ENCOUNTER — Ambulatory Visit: Payer: 59

## 2022-06-21 DIAGNOSIS — M25612 Stiffness of left shoulder, not elsewhere classified: Secondary | ICD-10-CM

## 2022-06-21 DIAGNOSIS — M25512 Pain in left shoulder: Secondary | ICD-10-CM | POA: Diagnosis not present

## 2022-06-21 DIAGNOSIS — G8929 Other chronic pain: Secondary | ICD-10-CM | POA: Diagnosis not present

## 2022-06-21 DIAGNOSIS — M6281 Muscle weakness (generalized): Secondary | ICD-10-CM

## 2022-06-21 NOTE — Therapy (Signed)
OUTPATIENT PHYSICAL THERAPY TREATMENT NOTE   Patient Name: [name] MRN: [id number] DOB:1970/08/01, 52 y.o., female Today's Date: 06/21/2022  PCP: Elenore Paddy, NP  REFERRING PROVIDER: Vernetta Honey, PA-C   END OF SESSION:   PT End of Session - 06/21/22 1518     Visit Number 2    Number of Visits 9    Date for PT Re-Evaluation 08/17/22    Authorization Type [employer]et[employer]    Authorization Time Period FOTO v6, v10    PT Start Time 1530    PT Stop Time 1617    PT Time Calculation (min) 47 min    Activity Tolerance Patient tolerated treatment well    Behavior During Therapy [facility] for tasks assessed/performed             Past Medical History:  Diagnosis Date   Gout    Hypertension    Migraines    Past Surgical History:  Procedure Laterality Date   ABDOMINAL HYSTERECTOMY  2008   Patient Active Problem List   Diagnosis Date Noted   Adhesive capsulitis of left shoulder 04/06/2022   Acute pain of left shoulder 01/05/2022   Constipation 01/05/2022   Colon cancer screening 01/05/2022   Abnormal MRI, lumbar spine (09/25/2021) 01/02/2022   Chronic hip pain (2ry area of Pain) (Left) 01/02/2022   Chronic low back pain (1ry area of Pain) (Left) w/o sciatica 01/02/2022   Chronic groin pain (3ry area of Pain) (Left) 01/02/2022   Greater trochanteric bursitis (4th area of Pain) (Left) 01/02/2022   Chronic pain syndrome 01/01/2022   Pharmacologic therapy 01/01/2022   Disorder of skeletal system 01/01/2022   Problems influencing health status 01/01/2022   Snoring 09/30/2021   Daytime sleepiness 09/30/2021   Prediabetes 09/30/2021   Tooth abscess 09/30/2021   Encounter for screening mammogram for malignant neoplasm of breast 09/30/2021   HLD (hyperlipidemia) 09/30/2021   PTSD (post-traumatic stress disorder) 05/05/2021   Strain of lumbar region 05/05/2021   Situational anxiety 05/05/2021   Osteoarthritis of knee (Right) 11/05/2020   Patellofemoral arthritis of knee  (Right) 06/21/2018   Cervical radiculopathy 06/21/2018   Essential hypertension 05/14/2018   Tobacco abuse 05/14/2018   Intractable migraine without status migrainosus 05/14/2018   Gout 05/14/2018    REFERRING DIAG: left shouder adhesive capsulitis    THERAPY DIAG:  Chronic left shoulder pain  Stiffness of left shoulder, not elsewhere classified  Muscle weakness (generalized)  Rationale for Evaluation and Treatment Rehabilitation  PERTINENT HISTORY: HTN, migraines   PRECAUTIONS: None  SUBJECTIVE:  SUBJECTIVE STATEMENT:  Patient reports continued pain but that it varies up to a 10. She reports she is only getting about 5 hours of sleep a night due to the pain.    PAIN:  Are you having pain? Yes: NPRS scale: 5/10 Pain location: Lt shoulder Pain description: dull, achy Aggravating factors: laying on Lt side, reaching overhead Relieving factors: rest, heating pad   OBJECTIVE: (objective measures completed at initial evaluation unless otherwise dated)   DIAGNOSTIC FINDINGS:  12/25/2021: DG Shoulder Left: IMPRESSION: Negative.   PATIENT SURVEYS:  FOTO 44%, projected 63% in 13 visits   COGNITION: Overall cognitive status: Within functional limits for tasks assessed                                  SENSATION: Not tested   POSTURE: Forward head/ rounded shoulders   UPPER EXTREMITY ROM:    A/PROM Right eval Left eval  Shoulder flexion 165/175 125/130p! (Firm end feel)  Shoulder abduction 180 100/105p! (Firm end feel)  Shoulder internal rotation 75/85 30/42p! (Firm end feel)  Shoulder external rotation 80/90 25/30p! (Firm end feel)  (Blank rows = not tested)   UPPER EXTREMITY MMT:   MMT Right eval Left eval  Shoulder flexion 5/5 4/5p!  Shoulder abduction 5/5 4/5p!  Shoulder  internal rotation 5/5 5/5  Shoulder external rotation 5/5 4/5p!  Middle trapezius 4+/5 4+/5  Lower trapezius 4/5 Unable to assume testing position  Latissimus dorsi 4+/5 4+/5  Grip strength (lbs) 80 65  (Blank rows = not tested)     JOINT MOBILITY TESTING:  Hypomobile GHJ mobilizations in AP/PA/inf with pain with AP   PALPATION:  TTP to global Lt GHJ, distal supraspinatus musculotendinous junction             TODAY'S TREATMENT:  [facility] Adult PT Treatment:                                                DATE: 06/21/2022 Therapeutic Exercise: Shoulder scaption AAROM with dowel 2x10 with 3" hold Shoulder ER AAROM with dowel 3x10 with 3" hold Shoulder IR stretch with towel 2x30" Lt Rows GTB 2x10 Manual Therapy: AP joint mobs grades I-III Inferior joint mobs grades I-III PROM all motions with gentle oscillations and stretch at end range Modalities: MHP x10 mins, pt in sitting, Lt shoulder                                                                            [facility] Adult PT Treatment:                                                DATE: 06/14/2022 Demonstrated and issued HEP       PATIENT EDUCATION: Education details: Pt educated on probable underlying pathophysiology, POC, prognosis, FOTO, and HEP Person educated: Patient Education method: Explanation, Demonstration, and Handouts Education comprehension: verbalized  understanding and returned demonstration   HOME EXERCISE PROGRAM: Access Code: [email or web address] URL: [email or web address] Date: 06/15/2022 Prepared by: Vanessa [place]   Exercises - Shoulder Scaption AAROM with Dowel (Mirrored)  - 1 x daily - 7 x weekly - 3 sets - 10 reps - 5 seconds hold - Standing Shoulder External Rotation AAROM with Dowel  - 1 x daily - 7 x weekly - 3 sets - 10 reps - 5 seconds hold - Standing Shoulder Internal Rotation Stretch with Towel (Mirrored)  - 1 x daily - 7 x weekly - 3 sets - 10 reps - 5 seconds hold - Standing Shoulder  Row with Anchored Resistance GTB  - 1 x daily - 7 x weekly - 3 sets - 10 reps - 3 seconds hold   ASSESSMENT:   CLINICAL IMPRESSION: Patient presents to PT with continued reports of Lt shoulder pain and reports HEP compliance and that the most difficult exercise is the internal rotation stretch with the towel. Session today focused on AAROM and periscapular strengthening as well as PROM and joint mobilizations to decrease tissue restriction. She is somewhat limited by pain throughout session, but is able to tolerate exercises and ROM. Patient continues to benefit from skilled PT services and should be progressed as able to improve functional independence.     OBJECTIVE IMPAIRMENTS: decreased activity tolerance, decreased endurance, decreased mobility, decreased ROM, decreased strength, hypomobility, increased fascial restrictions, impaired flexibility, impaired UE functional use, improper body mechanics, postural dysfunction, and pain.    ACTIVITY LIMITATIONS: carrying, lifting, sleeping, transfers, bathing, toileting, dressing, and reach over head   PARTICIPATION LIMITATIONS: cleaning, laundry, driving, shopping, community activity, and yard work   PERSONAL FACTORS: 1-2 comorbidities: HTN, migraines  are also affecting patient's functional outcome.    REHAB POTENTIAL: Good   CLINICAL DECISION MAKING: Stable/uncomplicated   EVALUATION COMPLEXITY: Low     GOALS: Goals reviewed with patient? Yes   SHORT TERM GOALS: Target date: 07/13/2022   Pt will report understanding and adherence to initial HEP in order to promote independence in the management of primary impairments. Baseline: HEP provided at eval Goal status: INITIAL       LONG TERM GOALS: Target date: 08/10/2022   Pt will achieve a FOTO score of 63% in order to demonstrate improved functional ability as it relates to the pt's primary impairments. Baseline: 44% Goal status: INITIAL   2.  Pt will achieve Lt shoulder flexion and  abduction AROM of 150 degrees in order to reach into overhead cabinets with less limitation. Baseline: 125 flexion, 100 abduction Goal status: INITIAL   3.  Pt will achieve Lt shoulder IR and ER AROM of 50 degrees in order to wash back with less limitation. Baseline: IR: 30, ER: 25 Goal status: INITIAL   4.  Pt will achieve Lt shoulder MMT of 5/5 with 0-3/10 pain in order to progress her independent strengthening regimen with less limitation. Baseline: See MMT chart (7/10 pain with painful motions) Goal status: INITIAL   5.  Pt will report ability to lay on Lt side with 0-2/10 pain in order to get a good night's sleep. Baseline: Pt unable to lay on Lt side due to pain Goal status: INITIAL     PLAN:   PT FREQUENCY: 1x/week   PT DURATION: 8 weeks   PLANNED INTERVENTIONS: Therapeutic exercises, Therapeutic activity, Neuromuscular re-education, Balance training, Gait training, Patient/Family education, Self Care, Joint mobilization, Joint manipulation, Orthotic/Fit training, Aquatic Therapy, Dry Needling, Electrical stimulation, Spinal manipulation, Spinal mobilization, Cryotherapy,  Moist heat, Taping, Vasopneumatic device, Biofeedback, Ionotophoresis 4mg /ml Dexamethasone, Manual therapy, and Re-evaluation   PLAN FOR NEXT SESSION: Progress GHJ mobilization/ stretching, early parascapular strengthening   Margarette [place], PTA 06/21/2022, 4:12 PM

## 2022-06-28 ENCOUNTER — Ambulatory Visit: Payer: 59

## 2022-07-05 ENCOUNTER — Ambulatory Visit: Payer: 59 | Attending: Nurse Practitioner | Admitting: Physical Therapy

## 2022-07-05 ENCOUNTER — Encounter: Payer: Self-pay | Admitting: Physical Therapy

## 2022-07-05 DIAGNOSIS — M6281 Muscle weakness (generalized): Secondary | ICD-10-CM | POA: Diagnosis not present

## 2022-07-05 DIAGNOSIS — G8929 Other chronic pain: Secondary | ICD-10-CM | POA: Diagnosis not present

## 2022-07-05 DIAGNOSIS — M25612 Stiffness of left shoulder, not elsewhere classified: Secondary | ICD-10-CM | POA: Diagnosis not present

## 2022-07-05 DIAGNOSIS — M25512 Pain in left shoulder: Secondary | ICD-10-CM | POA: Insufficient documentation

## 2022-07-05 NOTE — Therapy (Signed)
OUTPATIENT PHYSICAL THERAPY TREATMENT NOTE   Patient Name: [name] MRN: [id number] DOB:1971/02/18, 52 y.o., female Today's Date: 07/05/2022  PCP: Ailene Ards, NP  REFERRING PROVIDER: Ethelda Chick, PA-C   END OF SESSION:   PT End of Session - 07/05/22 1548     Visit Number 3    Number of Visits 9    Date for PT Re-Evaluation 08/17/22    Authorization Type [employer]    Authorization Time Period FOTO v6, v10    PT Start Time 1548    PT Stop Time 1630    PT Time Calculation (min) 42 min    Activity Tolerance Patient tolerated treatment well    Behavior During Therapy WFL for tasks assessed/performed             Past Medical History:  Diagnosis Date   Gout    Hypertension    Migraines    Past Surgical History:  Procedure Laterality Date   ABDOMINAL HYSTERECTOMY  2008   Patient Active Problem List   Diagnosis Date Noted   Adhesive capsulitis of left shoulder 04/06/2022   Acute pain of left shoulder 01/05/2022   Constipation 01/05/2022   Colon cancer screening 01/05/2022   Abnormal MRI, lumbar spine (09/25/2021) 01/02/2022   Chronic hip pain (2ry area of Pain) (Left) 01/02/2022   Chronic low back pain (1ry area of Pain) (Left) w/o sciatica 01/02/2022   Chronic groin pain (3ry area of Pain) (Left) 01/02/2022   Greater trochanteric bursitis (4th area of Pain) (Left) 01/02/2022   Chronic pain syndrome 01/01/2022   Pharmacologic therapy 01/01/2022   Disorder of skeletal system 01/01/2022   Problems influencing health status 01/01/2022   Snoring 09/30/2021   Daytime sleepiness 09/30/2021   Prediabetes 09/30/2021   Tooth abscess 09/30/2021   Encounter for screening mammogram for malignant neoplasm of breast 09/30/2021   HLD (hyperlipidemia) 09/30/2021   PTSD (post-traumatic stress disorder) 05/05/2021   Strain of lumbar region 05/05/2021   Situational anxiety 05/05/2021   Osteoarthritis of knee (Right) 11/05/2020   Patellofemoral arthritis of knee  (Right) 06/21/2018   Cervical radiculopathy 06/21/2018   Essential hypertension 05/14/2018   Tobacco abuse 05/14/2018   Intractable migraine without status migrainosus 05/14/2018   Gout 05/14/2018    REFERRING DIAG: left shouder adhesive capsulitis    THERAPY DIAG:  Chronic left shoulder pain  Stiffness of left shoulder, not elsewhere classified  Muscle weakness (generalized)  Rationale for Evaluation and Treatment Rehabilitation  PERTINENT HISTORY: HTN, migraines   PRECAUTIONS: None  SUBJECTIVE:  SUBJECTIVE STATEMENT:  Pt states exercises have been okay -- has been able to do them when she's not tired from work. Pain is decreasing. Pt states sleep is improving too and only if she lays on it.   PAIN:  Are you having pain? Yes: NPRS scale: 2/10 Pain location: Lt shoulder Pain description: dull, achy Aggravating factors: laying on Lt side, reaching overhead Relieving factors: rest, heating pad   OBJECTIVE: (objective measures completed at initial evaluation unless otherwise dated)   DIAGNOSTIC FINDINGS:  12/25/2021: DG Shoulder Left: IMPRESSION: Negative.   PATIENT SURVEYS:  FOTO 44%, projected 63% in 13 visits   COGNITION: Overall cognitive status: Within functional limits for tasks assessed                                  SENSATION: Not tested   POSTURE: Forward head/ rounded shoulders   UPPER EXTREMITY ROM:    A/PROM Right eval Left eval  Shoulder flexion 165/175 125/130p! (Firm end feel)  Shoulder abduction 180 100/105p! (Firm end feel)  Shoulder internal rotation 75/85 30/42p! (Firm end feel)  Shoulder external rotation 80/90 25/30p! (Firm end feel)  (Blank rows = not tested)   UPPER EXTREMITY MMT:   MMT Right eval Left eval  Shoulder flexion 5/5 4/5p!  Shoulder  abduction 5/5 4/5p!  Shoulder internal rotation 5/5 5/5  Shoulder external rotation 5/5 4/5p!  Middle trapezius 4+/5 4+/5  Lower trapezius 4/5 Unable to assume testing position  Latissimus dorsi 4+/5 4+/5  Grip strength (lbs) 80 65  (Blank rows = not tested)     JOINT MOBILITY TESTING:  Hypomobile GHJ mobilizations in AP/PA/inf with pain with AP   PALPATION:  TTP to global Lt GHJ, distal supraspinatus musculotendinous junction             TODAY'S TREATMENT:  [facility] Adult PT Treatment:                                                DATE: 07/05/22 Therapeutic Exercise: Seated pulleys flexion x1 min, scaption x 1 min (unable to get full abduction position), horizontal abd x1 min Doorway pec stretch low, mid, high 2x30 sec each Supine sleeper stretch x30 sec, standing x30 sec Wall wash scaption x10 Wall wash elbow 90 deg, shoulder abd 90 deg and into horizontal abd x10 with scap squeeze Lat stretch against wall 5x10 sec Shoulder ER red TB 2x10 "W" red TB 2x10 Shoulder horizontal abd red TB 2x10 Manual Therapy: STM & TPR pec and lats Shoulder PROM ER and abd Skilled assessment and palpation for TPDN Trigger Point Dry-Needling  Treatment instructions: Expect mild to moderate muscle soreness. S/S of pneumothorax if dry needled over a lung field, and to seek immediate medical attention should they occur. Patient verbalized understanding of these instructions and education.  Patient Consent Given: Yes Education handout provided: Yes Muscles treated: L pecs and lats Electrical stimulation performed: No Parameters: N/A Treatment response/outcome: Palpable increase in muscle length   [facility] Adult PT Treatment:                                                DATE: 06/21/2022 Therapeutic  Exercise: Shoulder scaption AAROM with dowel 2x10 with 3" hold Shoulder ER AAROM with dowel 3x10 with 3" hold Shoulder IR stretch with towel 2x30" Lt Rows GTB 2x10 Manual Therapy: AP joint mobs grades  I-III Inferior joint mobs grades I-III PROM all motions with gentle oscillations and stretch at end range Modalities: MHP x10 mins, pt in sitting, Lt shoulder                                                                            [facility] Adult PT Treatment:                                                DATE: 06/14/2022 Demonstrated and issued HEP       PATIENT EDUCATION: Education details: Pt educated on probable underlying pathophysiology, POC, prognosis, FOTO, and HEP Person educated: Patient Education method: Consulting civil engineer, Demonstration, and Handouts Education comprehension: verbalized understanding and returned demonstration   HOME EXERCISE PROGRAM: Access Code: [email or web address] URL: [email or web address] Date: 06/15/2022 Prepared by: Vanessa Rives   Exercises - Shoulder Scaption AAROM with Dowel (Mirrored)  - 1 x daily - 7 x weekly - 3 sets - 10 reps - 5 seconds hold - Standing Shoulder External Rotation AAROM with Dowel  - 1 x daily - 7 x weekly - 3 sets - 10 reps - 5 seconds hold - Standing Shoulder Internal Rotation Stretch with Towel (Mirrored)  - 1 x daily - 7 x weekly - 3 sets - 10 reps - 5 seconds hold - Standing Shoulder Row with Anchored Resistance GTB  - 1 x daily - 7 x weekly - 3 sets - 10 reps - 3 seconds hold   ASSESSMENT:   CLINICAL IMPRESSION: Performed trial of TPDN to loosen pecs and lats. Manual work for ROM. Continued to work on AROM and posterior shoulder strengthening.  Difficulty with shoulder ER above 45 deg of shoulder abd.     OBJECTIVE IMPAIRMENTS: decreased activity tolerance, decreased endurance, decreased mobility, decreased ROM, decreased strength, hypomobility, increased fascial restrictions, impaired flexibility, impaired UE functional use, improper body mechanics, postural dysfunction, and pain.    ACTIVITY LIMITATIONS: carrying, lifting, sleeping, transfers, bathing, toileting, dressing, and reach over head   PARTICIPATION  LIMITATIONS: cleaning, laundry, driving, shopping, community activity, and yard work   PERSONAL FACTORS: 1-2 comorbidities: HTN, migraines  are also affecting patient's functional outcome.    REHAB POTENTIAL: Good   CLINICAL DECISION MAKING: Stable/uncomplicated   EVALUATION COMPLEXITY: Low     GOALS: Goals reviewed with patient? Yes   SHORT TERM GOALS: Target date: 07/13/2022   Pt will report understanding and adherence to initial HEP in order to promote independence in the management of primary impairments. Baseline: HEP provided at eval Goal status: INITIAL       LONG TERM GOALS: Target date: 08/10/2022   Pt will achieve a FOTO score of 63% in order to demonstrate improved functional ability as it relates to the pt's primary impairments. Baseline: 44% Goal status: INITIAL   2.  Pt will achieve Lt shoulder flexion and abduction AROM of 150 degrees  in order to reach into overhead cabinets with less limitation. Baseline: 125 flexion, 100 abduction Goal status: INITIAL   3.  Pt will achieve Lt shoulder IR and ER AROM of 50 degrees in order to wash back with less limitation. Baseline: IR: 30, ER: 25 Goal status: INITIAL   4.  Pt will achieve Lt shoulder MMT of 5/5 with 0-3/10 pain in order to progress her independent strengthening regimen with less limitation. Baseline: See MMT chart (7/10 pain with painful motions) Goal status: INITIAL   5.  Pt will report ability to lay on Lt side with 0-2/10 pain in order to get a good night's sleep. Baseline: Pt unable to lay on Lt side due to pain Goal status: INITIAL     PLAN:   PT FREQUENCY: 1x/week   PT DURATION: 8 weeks   PLANNED INTERVENTIONS: Therapeutic exercises, Therapeutic activity, Neuromuscular re-education, Balance training, Gait training, Patient/Family education, Self Care, Joint mobilization, Joint manipulation, Orthotic/Fit training, Aquatic Therapy, Dry Needling, Electrical stimulation, Spinal manipulation, Spinal  mobilization, Cryotherapy, Moist heat, Taping, Vasopneumatic device, Biofeedback, Ionotophoresis 4mg /ml Dexamethasone, Manual therapy, and Re-evaluation   PLAN FOR NEXT SESSION: Progress GHJ mobilization/ stretching, early parascapular strengthening   Jasier Calabretta April Ma L Vian Fluegel, PT 07/05/2022, 3:49 PM

## 2022-07-12 ENCOUNTER — Ambulatory Visit: Payer: 59 | Admitting: Physical Therapy

## 2022-08-04 ENCOUNTER — Other Ambulatory Visit: Payer: Self-pay

## 2022-08-04 ENCOUNTER — Telehealth: Payer: Self-pay | Admitting: Nurse Practitioner

## 2022-08-04 DIAGNOSIS — G43919 Migraine, unspecified, intractable, without status migrainosus: Secondary | ICD-10-CM

## 2022-08-04 MED ORDER — TOPIRAMATE 25 MG PO TABS: 25.0000 mg | ORAL_TABLET | Freq: Two times a day (BID) | ORAL | 0 refills | Status: DC

## 2022-08-04 NOTE — Telephone Encounter (Signed)
Medication refill send, my chart message is send to pt

## 2022-08-04 NOTE — Telephone Encounter (Signed)
Prescription Request  08/04/2022  LOV: 01/05/2022  What is the name of the medication or equipment? topiramate (TOPAMAX) 25 MG tablet  (Would also like to note that PT is not currently insured, not sure if this medication would or would not be viable for them to pay for out of pocket and pharmacy could not confirm the price for this out of pocket)  Have you contacted your pharmacy to request a refill? Yes   Which pharmacy would you like this sent to?  [place] ([place]), [place] - [address] [place] [address] Phone: [phone] Fax: [phone]    Patient notified that their request is being sent to the clinical staff for review and that they should receive a response within 2 business days.   Please advise at Mobile [phone] (mobile)

## 2022-09-28 ENCOUNTER — Other Ambulatory Visit: Payer: Self-pay | Admitting: Nurse Practitioner

## 2022-09-28 ENCOUNTER — Ambulatory Visit (INDEPENDENT_AMBULATORY_CARE_PROVIDER_SITE_OTHER): Payer: 59

## 2022-09-28 ENCOUNTER — Ambulatory Visit (INDEPENDENT_AMBULATORY_CARE_PROVIDER_SITE_OTHER): Payer: 59 | Admitting: Nurse Practitioner

## 2022-09-28 VITALS — BP 122/86 | HR 80 | Temp 98.0°F | Ht 67.0 in | Wt 168.4 lb

## 2022-09-28 DIAGNOSIS — M25561 Pain in right knee: Secondary | ICD-10-CM | POA: Diagnosis not present

## 2022-09-28 DIAGNOSIS — G43919 Migraine, unspecified, intractable, without status migrainosus: Secondary | ICD-10-CM

## 2022-09-28 DIAGNOSIS — I10 Essential (primary) hypertension: Secondary | ICD-10-CM

## 2022-09-28 DIAGNOSIS — D75839 Thrombocytosis, unspecified: Secondary | ICD-10-CM

## 2022-09-28 DIAGNOSIS — M1711 Unilateral primary osteoarthritis, right knee: Secondary | ICD-10-CM

## 2022-09-28 DIAGNOSIS — G8929 Other chronic pain: Secondary | ICD-10-CM | POA: Insufficient documentation

## 2022-09-28 DIAGNOSIS — M25562 Pain in left knee: Secondary | ICD-10-CM | POA: Insufficient documentation

## 2022-09-28 LAB — CBC
HCT: 40.5 % (ref 36.0–46.0)
Hemoglobin: 13.3 g/dL (ref 12.0–15.0)
MCHC: 32.8 g/dL (ref 30.0–36.0)
MCV: 87.5 fl (ref 78.0–100.0)
Platelets: 431 10*3/uL — ABNORMAL HIGH (ref 150.0–400.0)
RBC: 4.63 Mil/uL (ref 3.87–5.11)
RDW: 13.2 % (ref 11.5–15.5)
WBC: 10.5 10*3/uL (ref 4.0–10.5)

## 2022-09-28 LAB — COMPREHENSIVE METABOLIC PANEL
ALT: 21 U/L (ref 0–35)
AST: 24 U/L (ref 0–37)
Albumin: 4.5 g/dL (ref 3.5–5.2)
Alkaline Phosphatase: 78 U/L (ref 39–117)
BUN: 13 mg/dL (ref 6–23)
CO2: 22 mEq/L (ref 19–32)
Calcium: 9.5 mg/dL (ref 8.4–10.5)
Chloride: 104 mEq/L (ref 96–112)
Creatinine, Ser: 0.8 mg/dL (ref 0.40–1.20)
GFR: 85.28 mL/min (ref >60.00)
Glucose, Bld: 88 mg/dL (ref 70–99)
Potassium: 3.9 mEq/L (ref 3.5–5.1)
Sodium: 136 mEq/L (ref 135–145)
Total Bilirubin: 0.3 mg/dL (ref 0.2–1.2)
Total Protein: 8 g/dL (ref 6.0–8.3)

## 2022-09-28 MED ORDER — PREDNISONE 20 MG PO TABS
20.0000 mg | ORAL_TABLET | Freq: Every day | ORAL | 0 refills | Status: DC
Start: 2022-09-28 — End: 2022-10-26

## 2022-09-28 MED ORDER — IBUPROFEN 600 MG PO TABS
600.0000 mg | ORAL_TABLET | Freq: Three times a day (TID) | ORAL | 1 refills | Status: DC | PRN
Start: 2022-09-28 — End: 2022-11-14

## 2022-09-28 MED ORDER — AMLODIPINE BESYLATE 10 MG PO TABS: ORAL_TABLET | ORAL | 1 refills | Status: DC

## 2022-09-28 MED ORDER — TOPIRAMATE 25 MG PO TABS: 25.0000 mg | ORAL_TABLET | Freq: Two times a day (BID) | ORAL | 1 refills | Status: DC

## 2022-09-28 NOTE — Patient Instructions (Signed)
DO NOT TAKE ANY NSAIDS WHILE ON PREDNISONE ONLY TAKE TYLENOL  Start prednisone one tab/day for 5 days with tylenol 650mg  every 8 hours scheduled.  Once you have stopped the prednisone follow a schedule as outlined below until pain has improved is under control. Then start to take ibuprofen and tylenol as needed:  8:00am ibuprofen 600mg  12:00pm: tylenol 650mg  4:00pm: ibuprofen 600mg  8:00 pm: tylenol 650mg   DO NOT EXCEED 3200mg  of ibuprofen in 24 hours DO NOT EXCEED 3000mg  of tylenol in 24 hours  Call with any questions

## 2022-09-28 NOTE — Assessment & Plan Note (Signed)
Acute on chronic Exacerbated with prolonged standing and walking Re-refer to pain management She was short course of prednisone with scheduled Tylenol Then transition to Tylenol and ibuprofen on a scheduled rotation until pain better controlled, then use Tylenol and ibuprofen as needed. X-ray ordered as well for further evaluation

## 2022-09-28 NOTE — Progress Notes (Signed)
Established Patient Office Visit  Subjective   Patient ID: [name], female    DOB: 01/20/1971  Age: 52 y.o. MRN: [id number]  Chief Complaint  Patient presents with   Medication Refill    Also right knee swelling, painful also ( wants something to help with the fluid build up around it)     Right knee pain/Osteoarthritis: Chronic.  Has tried and failed Excedrin, Aleve, naproxen, Voltaren gel, IcyHot, hemp gel.,  Physical therapy, heat and ice denies any recent traumatic injury to the knee.  Has noticed some increased swelling.  Triggers include prolonged standing or walking.  Has also undergone physical therapy she did 2 times a week x 4 weeks without much improvement.  Has had injections in the knee in the past but would prefer not to do these again.  Pain is described as throbbing and rated a 10 out of 10 today.  Only alleviating factor is when she rests the knee with her right leg elevated.  Migraine: On Topamax 25 mg twice a day for migraine prevention.  Reports this is helpful regarding her headaches.  Hypertension: On amlodipine 10 mg daily    Review of Systems  Respiratory:  Negative for shortness of breath.   Cardiovascular:  Negative for chest pain.  Musculoskeletal:  Positive for joint pain.  : see HPI    Objective:     BP 122/86   Pulse 80   Temp 98 F (36.7 C) (Temporal)   Ht 5\' 7"  (1.702 m)   Wt 168 lb 6 oz (76.4 kg)   SpO2 98%   BMI 26.37 kg/m    Physical Exam Vitals reviewed.  Constitutional:      General: She is not in acute distress.    Appearance: Normal appearance.  HENT:     Head: Normocephalic and atraumatic.  Neck:     Vascular: No carotid bruit.  Cardiovascular:     Rate and Rhythm: Normal rate and regular rhythm.     Pulses: Normal pulses.     Heart sounds: Normal heart sounds.  Pulmonary:     Effort: Pulmonary effort is normal.     Breath sounds: Normal breath sounds.  Musculoskeletal:     Right knee: Swelling present. No  erythema.  Skin:    General: Skin is warm and dry.  Neurological:     General: No focal deficit present.     Mental Status: She is alert and oriented to person, place, and time.     Gait: Gait abnormal (antalgic gait).  Psychiatric:        Mood and Affect: Mood normal.        Behavior: Behavior normal.        Judgment: Judgment normal.      No results found for any visits on 09/28/22.    The 10-year ASCVD risk score (Arnett DK, et al., 2019) is: 6.9%    Assessment & Plan:   Problem List Items Addressed This Visit       Cardiovascular and Mediastinum   Essential hypertension    Chronic At goal Continue amlodipine 10 mg daily      Relevant Medications   amLODipine (NORVASC) 10 MG tablet   Other Relevant Orders   CBC   Comprehensive metabolic panel   Intractable migraine without status migrainosus - Primary    Chronic Well-controlled on current regimen Continue topiramate 25 mg twice a day      Relevant Medications   topiramate (TOPAMAX) 25 MG tablet  amLODipine (NORVASC) 10 MG tablet   acetaminophen (TYLENOL) 650 MG CR tablet   ibuprofen (ADVIL) 600 MG tablet     Musculoskeletal and Integument   Osteoarthritis of knee (Right) (Chronic)    Acute on chronic Exacerbated with prolonged standing and walking Re-refer to pain management She was short course of prednisone with scheduled Tylenol Then transition to Tylenol and ibuprofen on a scheduled rotation until pain better controlled, then use Tylenol and ibuprofen as needed. X-ray ordered as well for further evaluation      Relevant Medications   acetaminophen (TYLENOL) 650 MG CR tablet   ibuprofen (ADVIL) 600 MG tablet   predniSONE (DELTASONE) 20 MG tablet   Other Relevant Orders   DG Knee 1-2 Views Right   Ambulatory referral to Pain Clinic    Return in about 6 months (around 03/31/2023) for F/U with Maevis Mumby.    Elenore Paddy, NP

## 2022-09-28 NOTE — Assessment & Plan Note (Signed)
Chronic At goal Continue amlodipine 10 mg daily

## 2022-09-28 NOTE — Assessment & Plan Note (Signed)
Chronic Well-controlled on current regimen Continue topiramate 25 mg twice a day

## 2022-10-03 ENCOUNTER — Telehealth: Payer: Self-pay | Admitting: Nurse Practitioner

## 2022-10-03 NOTE — Telephone Encounter (Signed)
Pt called wanting to know why she is referred to oncologist explain to the pt that her diagnosis is Thrombytosis. Pt is unaware of that diagnosis please call pt with update.

## 2022-10-03 NOTE — Telephone Encounter (Signed)
Called pt she was concern about why she need to see oncologist. Inform pt per labs Sarah want her to be evaluated by hematologist due to her Platelets running high. Inform her hematologist deals w/ her blood. She doesn't have to see oncologist. There located in the same area. Pt verbal understands now.Marland KitchenShearon Terry

## 2022-10-10 ENCOUNTER — Other Ambulatory Visit: Payer: Self-pay | Admitting: Nurse Practitioner

## 2022-10-10 DIAGNOSIS — M25512 Pain in left shoulder: Secondary | ICD-10-CM

## 2022-10-10 DIAGNOSIS — M1711 Unilateral primary osteoarthritis, right knee: Secondary | ICD-10-CM

## 2022-10-13 ENCOUNTER — Telehealth: Payer: Self-pay

## 2022-10-13 ENCOUNTER — Encounter: Payer: Self-pay | Admitting: Physical Medicine & Rehabilitation

## 2022-10-13 ENCOUNTER — Ambulatory Visit (INDEPENDENT_AMBULATORY_CARE_PROVIDER_SITE_OTHER): Payer: 59 | Admitting: Orthopaedic Surgery

## 2022-10-13 ENCOUNTER — Encounter: Payer: 59 | Attending: Physical Medicine & Rehabilitation | Admitting: Physical Medicine & Rehabilitation

## 2022-10-13 ENCOUNTER — Encounter: Payer: Self-pay | Admitting: Orthopaedic Surgery

## 2022-10-13 VITALS — BP 125/81 | HR 77 | Ht 67.0 in | Wt 165.0 lb

## 2022-10-13 DIAGNOSIS — M1711 Unilateral primary osteoarthritis, right knee: Secondary | ICD-10-CM | POA: Diagnosis not present

## 2022-10-13 NOTE — Progress Notes (Signed)
Office Visit Note   Patient: [name]           Date of Birth: 1971/05/09           MRN: [id number] Visit Date: 10/13/2022              Requested by: Elenore Paddy, NP [address] [place],  [place] [address] PCP: Elenore Paddy, NP   Assessment & Plan: Visit Diagnoses:  1. Unilateral primary osteoarthritis, right knee     Plan: Impression is chronic right knee pain with mild osteoarthritis.  Patient has tried bracing, ice, heat, oral and topical NSAIDs as well as cortisone injection all without significant relief.  Viscosupplementation injection is the only thing that seems to provide any relief and unfortunately this only lasted for about 3 months.  We do not recommend total knee replacement given her degree of arthritis.  It seems as though the viscosupplementation injection is only thing providing some relief so we will obtain approval from her insurance.  She will follow-up with [place] once approved.  Follow-Up Instructions: Return for once approved for visco inj.   Orders:  No orders of the defined types were placed in this encounter.  No orders of the defined types were placed in this encounter.     Procedures: No procedures performed   Clinical Data: No additional findings.   Subjective: Chief Complaint  Patient presents with   Right Knee - Pain    HPI patient is a pleasant 52 year old female who comes in today with chronic right knee pain.  Symptoms have been ongoing for years and have progressively worsened.  The pain she has is to the medial aspect which is worse when it rains as well as at the end of the workday as she works at [employer] and stands on her feet for over 7 hours a day.  She has associated swelling and stiffness.  She has tried knee bracing, ice, heat, topical and oral NSAIDs without relief.  She has undergone cortisone injections without relief.  She has most recently been getting Monovisc injections every 6 months from pain  management.  Her last injection was over a year ago.  She notes she usually gets about 3 months relief following each of these injections.  Of note, MRI from November 2020 showed mild tricompartmental osteoarthritis.  She denies any new injury.  She is also been worked up for autoimmune disease which was unremarkable.  Review of Systems as detailed in HPI.  All others reviewed and are negative.   Objective: Vital Signs: There were no vitals taken for this visit.  Physical Exam well-developed well-nourished female in no acute distress.  Alert and oriented x 3.  Ortho Exam right knee exam shows no effusion.  Range of motion 0 to 120 degrees.  Medial joint line tenderness.  She is neurovascularly intact distally.  Specialty Comments:  No specialty comments available.  Imaging: No new imaging   PMFS History: Patient Active Problem List   Diagnosis Date Noted   Chronic pain of right knee 09/28/2022   Adhesive capsulitis of left shoulder 04/06/2022   Acute pain of left shoulder 01/05/2022   Constipation 01/05/2022   Colon cancer screening 01/05/2022   Abnormal MRI, lumbar spine (09/25/2021) 01/02/2022   Chronic hip pain (2ry area of Pain) (Left) 01/02/2022   Chronic low back pain (1ry area of Pain) (Left) w/o sciatica 01/02/2022   Chronic groin pain (3ry area of Pain) (Left) 01/02/2022  Greater trochanteric bursitis (4th area of Pain) (Left) 01/02/2022   Chronic pain syndrome 01/01/2022   Pharmacologic therapy 01/01/2022   Disorder of skeletal system 01/01/2022   Problems influencing health status 01/01/2022   Snoring 09/30/2021   Daytime sleepiness 09/30/2021   Prediabetes 09/30/2021   Tooth abscess 09/30/2021   Encounter for screening mammogram for malignant neoplasm of breast 09/30/2021   HLD (hyperlipidemia) 09/30/2021   PTSD (post-traumatic stress disorder) 05/05/2021   Strain of lumbar region 05/05/2021   Situational anxiety 05/05/2021   Osteoarthritis of knee (Right)  11/05/2020   Patellofemoral arthritis of knee (Right) 06/21/2018   Cervical radiculopathy 06/21/2018   Essential hypertension 05/14/2018   Tobacco abuse 05/14/2018   Intractable migraine without status migrainosus 05/14/2018   Gout 05/14/2018   Past Medical History:  Diagnosis Date   Gout    Hypertension    Migraines     Family History  Problem Relation Age of Onset   Hypertension Mother    Healthy Father     Past Surgical History:  Procedure Laterality Date   ABDOMINAL HYSTERECTOMY  2008   Social History   Occupational History   Not on file  Tobacco Use   Smoking status: Every Day    Packs/day: .1    Types: Cigarettes   Smokeless tobacco: Never   Tobacco comments:    pack lasts 3 weeks  Vaping Use   Vaping Use: Never used  Substance and Sexual Activity   Alcohol use: Not Currently    Comment: occasional   Drug use: No   Sexual activity: Not Currently    Birth control/protection: Surgical

## 2022-10-13 NOTE — Telephone Encounter (Signed)
Please precert for right knee gel injection. Dr.Xu's patient.  

## 2022-10-13 NOTE — Progress Notes (Signed)
Subjective:    Patient ID: [name], female    DOB: 1970/11/12, 52 y.o.   MRN: [id number]  HPI Chief complaint right knee pain 52 year old female with osteoarthritis of the right knee returns today after her last clinic visit in March 2023.  She has in the meantime seen sports medicine who have been treating her for trochanteric bursitis and sacroiliac dysfunction.  She has been seen by pain medicine in [place] as well. The patient has seen orthopedics for her right knee pain this morning and they have recommended gel injections. We discussed the patient had Zilretta injections every 3 months while a patient at this clinic.  Her knee pain is graded as 8 out of 10 with walking and standing.  She does use Voltaren gel 4 times per day. Pain Inventory Average Pain 8 Pain Right Now 9 My pain is dull and aching  In the last 24 hours, has pain interfered with the following? General activity 3 Relation with others 3 Enjoyment of life 0 What TIME of day is your pain at its worst? daytime and evening Sleep (in general) Fair  Pain is worse with: walking, bending, and standing Pain improves with: therapy/exercise and pacing activities Relief from Meds: 2  Family History  Problem Relation Age of Onset   Hypertension Mother    Healthy Father    Social History   Socioeconomic History   Marital status: Single    Spouse name: Not on file   Number of children: Not on file   Years of education: Not on file   Highest education level: Not on file  Occupational History   Not on file  Tobacco Use   Smoking status: Every Day    Packs/day: .1    Types: Cigarettes   Smokeless tobacco: Never   Tobacco comments:    pack lasts 3 weeks  Vaping Use   Vaping Use: Never used  Substance and Sexual Activity   Alcohol use: Not Currently    Comment: occasional   Drug use: No   Sexual activity: Not Currently    Birth control/protection: Surgical  Other Topics Concern   Not on file   Social History Narrative   Not on file   Social Determinants of Health   Financial Resource Strain: Not on file  Food Insecurity: Not on file  Transportation Needs: Not on file  Physical Activity: Not on file  Stress: Not on file  Social Connections: Not on file   Past Surgical History:  Procedure Laterality Date   ABDOMINAL HYSTERECTOMY  2008   Past Surgical History:  Procedure Laterality Date   ABDOMINAL HYSTERECTOMY  2008   Past Medical History:  Diagnosis Date   Gout    Hypertension    Migraines    BP 125/81   Pulse 77   Ht 5\' 7"  (1.702 m)   Wt 165 lb (74.8 kg)   SpO2 99%   BMI 25.84 kg/m   Opioid Risk Score:   Fall Risk Score:  `1  Depression screen [facility] 2/9     10/13/2022    9:02 AM 09/28/2022    3:25 PM 04/06/2022    1:12 PM 01/05/2022    3:33 PM 09/30/2021    3:26 PM 08/04/2021    9:25 AM 03/18/2021    9:31 AM  Depression screen PHQ 2/9  Decreased Interest 0 0 1 0 0 0 0  Down, Depressed, Hopeless 0 0 0 0 0 0 0  PHQ - 2 Score 0  0 1 0 0 0 0  Altered sleeping   1 0 0    Tired, decreased energy   1 0 0    Change in appetite   0 0 0    Feeling bad or failure about yourself    0 0 0    Trouble concentrating   0 0 0    Moving slowly or fidgety/restless   0 0 0    Suicidal thoughts   0 0     PHQ-9 Score   3 0 0    Difficult doing work/chores   Extremely dIfficult Not difficult at all Not difficult at all         Review of Systems  Musculoskeletal:  Positive for back pain.       Right knee pain   All other systems reviewed and are negative.     Objective:   Physical Exam   General no acute distress Mood and affect are appropriate Extremities without edema Right knee without evidence of effusion There is crepitus at the patella There is mild tenderness over the medial joint line as well as over the patellar tendon.  No pain with range of motion.  She does have pain with ambulation.     Assessment & Plan:  1.  Right knee pain osteoarthritis,  has done well with Zilretta injections in the past however she would like to avoid steroids.  She discussed trying gel injections at the orthopedic office.  Because of insurance issues this would work out better for her financially as well. We discussed that should she wish to follow-up at this clinic she should just call.

## 2022-10-17 NOTE — Telephone Encounter (Signed)
VOB submitted for Monovisc, right knee  

## 2022-10-24 ENCOUNTER — Other Ambulatory Visit: Payer: Self-pay | Admitting: *Deleted

## 2022-10-24 DIAGNOSIS — D75839 Thrombocytosis, unspecified: Secondary | ICD-10-CM

## 2022-10-25 DIAGNOSIS — Z720 Tobacco use: Secondary | ICD-10-CM | POA: Diagnosis not present

## 2022-10-25 DIAGNOSIS — G3184 Mild cognitive impairment, so stated: Secondary | ICD-10-CM | POA: Diagnosis not present

## 2022-10-25 DIAGNOSIS — G43909 Migraine, unspecified, not intractable, without status migrainosus: Secondary | ICD-10-CM | POA: Diagnosis not present

## 2022-10-25 DIAGNOSIS — I10 Essential (primary) hypertension: Secondary | ICD-10-CM | POA: Diagnosis not present

## 2022-10-25 DIAGNOSIS — Z8261 Family history of arthritis: Secondary | ICD-10-CM | POA: Diagnosis not present

## 2022-10-25 DIAGNOSIS — Z8249 Family history of ischemic heart disease and other diseases of the circulatory system: Secondary | ICD-10-CM | POA: Diagnosis not present

## 2022-10-25 DIAGNOSIS — M1990 Unspecified osteoarthritis, unspecified site: Secondary | ICD-10-CM | POA: Diagnosis not present

## 2022-10-25 DIAGNOSIS — Z888 Allergy status to other drugs, medicaments and biological substances status: Secondary | ICD-10-CM | POA: Diagnosis not present

## 2022-10-26 ENCOUNTER — Inpatient Hospital Stay: Payer: 59

## 2022-10-26 ENCOUNTER — Other Ambulatory Visit: Payer: 59

## 2022-10-26 ENCOUNTER — Inpatient Hospital Stay: Payer: 59 | Attending: Oncology | Admitting: Oncology

## 2022-10-26 VITALS — BP 127/78 | HR 69 | Temp 98.3°F | Resp 16 | Wt 167.0 lb

## 2022-10-26 DIAGNOSIS — M1711 Unilateral primary osteoarthritis, right knee: Secondary | ICD-10-CM | POA: Insufficient documentation

## 2022-10-26 DIAGNOSIS — D75839 Thrombocytosis, unspecified: Secondary | ICD-10-CM

## 2022-10-26 DIAGNOSIS — I10 Essential (primary) hypertension: Secondary | ICD-10-CM | POA: Diagnosis not present

## 2022-10-26 DIAGNOSIS — F17210 Nicotine dependence, cigarettes, uncomplicated: Secondary | ICD-10-CM | POA: Diagnosis not present

## 2022-10-26 DIAGNOSIS — Z90710 Acquired absence of both cervix and uterus: Secondary | ICD-10-CM | POA: Insufficient documentation

## 2022-10-26 LAB — CBC WITH DIFFERENTIAL (CANCER CENTER ONLY)
Abs Immature Granulocytes: 0.02 10*3/uL (ref 0.00–0.07)
Basophils Absolute: 0 10*3/uL (ref 0.0–0.1)
Basophils Relative: 0 %
Eosinophils Absolute: 0.1 10*3/uL (ref 0.0–0.5)
Eosinophils Relative: 1 %
HCT: 38.5 % (ref 36.0–46.0)
Hemoglobin: 12.4 g/dL (ref 12.0–15.0)
Immature Granulocytes: 0 %
Lymphocytes Relative: 37 %
Lymphs Abs: 3.7 10*3/uL (ref 0.7–4.0)
MCH: 28.1 pg (ref 26.0–34.0)
MCHC: 32.2 g/dL (ref 30.0–36.0)
MCV: 87.3 fL (ref 80.0–100.0)
Monocytes Absolute: 0.7 10*3/uL (ref 0.1–1.0)
Monocytes Relative: 7 %
Neutro Abs: 5.4 10*3/uL (ref 1.7–7.7)
Neutrophils Relative %: 55 %
Platelet Count: 449 10*3/uL — ABNORMAL HIGH (ref 150–400)
RBC: 4.41 MIL/uL (ref 3.87–5.11)
RDW: 12.9 % (ref 11.5–15.5)
WBC Count: 9.8 10*3/uL (ref 4.0–10.5)
nRBC: 0 % (ref 0.0–0.2)

## 2022-10-26 LAB — VITAMIN B12: Vitamin B-12: 658 pg/mL (ref 180–914)

## 2022-10-26 LAB — SAVE SMEAR(SSMR), FOR PROVIDER SLIDE REVIEW

## 2022-10-26 LAB — FERRITIN: Ferritin: 57 ng/mL (ref 11–307)

## 2022-10-26 LAB — SEDIMENTATION RATE: Sed Rate: 20 mm/hr (ref 0–22)

## 2022-10-26 NOTE — Progress Notes (Signed)
[facility] New Patient Consult   Requesting MD: Elenore Paddy, Np [address] [place],  [place] [address]   [name] 52 y.o.  1970/09/10    Reason for Consult: Thrombocytosis   HPI: [name] follow-up of right knee osteoarthritis on 09/28/2022.  A CBC found the hemoglobin at 13.3, hematocrit 40.5%, MCV 87.5, platelets 441,000, and WBC 10.5.  Review of the electronic medical record reveals  mild elevation of the platelet count dating to 2016.  She reports a history of "iron deficiency "in the past.  No bleeding.  She had a hysterectomy in 2008 for menorrhagia.  [name] feels well at present.  Past Medical History:  Diagnosis Date   Gout    Hypertension    Migraines     .  G5, P5   .  Right knee degenerative arthritis   .  History of left shoulder adhesive capsulitis   .  Gout  Past Surgical History:  Procedure Laterality Date   ABDOMINAL HYSTERECTOMY  2008    Medications: Reviewed  Allergies:  Allergies  Allergen Reactions   Xanax [Alprazolam] Itching    Family history: No family history of a hematologic disorder.  Her maternal grandmother had "cancer ".  Social History:   She lives with her children in [place].  She works at [employer].  She smokes cigarettes.  She does not use alcohol.  No transfusion history.  No risk factor for HIV or hepatitis.  ROS:   Positives include: History of "arthritis "pain in the right knee and left pelvis-none at present  A complete ROS was otherwise negative.  Physical Exam:  Blood pressure 127/78, pulse 69, temperature 98.3 F (36.8 C), temperature source Oral, resp. rate 16, weight 167 lb (75.8 kg), SpO2 100 %.  HEENT: Oropharynx without visible mass, neck without mass Lungs: Clear bilaterally Cardiac: Regular rate and rhythm Abdomen: No hepatosplenomegaly, no mass, nontender  Vascular: No leg edema Lymph nodes: "Shotty "bilateral scalene nodes.  No  overall, supraclavicular, axillary, or inguinal nodes.  Prominent bilateral axillary fat pads. Neurologic: Alert and oriented, the motor exam appears intact in the upper and lower extremities bilaterally Skin: No rash, tattoo with the left upper thigh and groin Musculoskeletal: No spine tenderness   LAB:  CBC  Lab Results  Component Value Date   WBC 9.8 10/26/2022   HGB 12.4 10/26/2022   HCT 38.5 10/26/2022   MCV 87.3 10/26/2022   PLT 449 (H) 10/26/2022   NEUTROABS PENDING 10/26/2022    Smear: The platelets are mildly increased in number.  Rare large platelet.  Few small platelet clumps.  The Red cell morphology is unremarkable.  The polychromasia is not increased.  Majority white cells are mature appearing neutrophils and lymphocytes.  Few 5 lobed neutrophils.  No blasts or other young forms are seen.    CMP  Lab Results  Component Value Date   NA 136 09/28/2022   K 3.9 09/28/2022   CL 104 09/28/2022   CO2 22 09/28/2022   GLUCOSE 88 09/28/2022   BUN 13 09/28/2022   CREATININE 0.80 09/28/2022   CALCIUM 9.5 09/28/2022   PROT 8.0 09/28/2022   ALBUMIN 4.5 09/28/2022   AST 24 09/28/2022   ALT 21 09/28/2022   ALKPHOS 78 09/28/2022   BILITOT 0.3 09/28/2022   GFRNONAA >60 10/28/2021   GFRAA >60 08/03/2017       Assessment/Plan:   Thrombocytosis Chronic mild elevation i of the platelet count dating  to 2016 Degenerative arthritis of the right knee Hypertension History of migraine headaches G5, P5 Hysterectomy in 2008 for menorrhagia Ongoing tobacco use History of left shoulder adhesive capsulitis History of gout   Disposition:   [name] is referred for evaluation of thrombocytosis.  She is not anemic.  I discussed the differential diagnosis of an elevated platelet count with her.  This includes a benign normal variant, iron deficiency, a systemic inflammatory condition, and a myeloproliferative disorder.  Review of the blood smear is not suggestive of iron  deficiency.  She does not have a clinical history to suggest an inflammatory condition such as rheumatoid arthritis.  She appears to have degenerative or osteoarthritis of the right knee.  We will check an erythrocyte sedimentation rate today.  I suspect the elevated platelet count is secondary to benign normal variant.  We will obtain a molecular myeloproliferative panel to look for evidence of essential thrombocytosis.  There is no indication for platelet lowering therapy at present.  She will return for an office visit and CBC in 8 months.  I recommended she scheduled for a screening colonoscopy.  Thornton Papas, MD  10/26/2022, 1:41 PM

## 2022-11-02 ENCOUNTER — Telehealth: Payer: Self-pay

## 2022-11-02 NOTE — Telephone Encounter (Signed)
Faxed completed PA form to [employer] at [phone] for Monovisc, right knee. PA Pending

## 2022-11-03 ENCOUNTER — Other Ambulatory Visit: Payer: 59

## 2022-11-03 ENCOUNTER — Encounter: Payer: 59 | Admitting: Oncology

## 2022-11-10 ENCOUNTER — Telehealth: Payer: Self-pay

## 2022-11-10 NOTE — Telephone Encounter (Signed)
PA still pending for Monovisc, right knee.  

## 2022-11-13 NOTE — Progress Notes (Deleted)
    Subjective:    Patient ID: [name], female    DOB: 02/09/1971, 52 y.o.   MRN: [id number]      HPI [name] is here for No chief complaint on file.    Leg swelling -     Medications and allergies reviewed with patient and updated if appropriate.  Current Outpatient Medications on File Prior to Visit  Medication Sig Dispense Refill   acetaminophen (TYLENOL) 650 MG CR tablet Take 650 mg by mouth every 8 (eight) hours as needed for pain.     amLODipine (NORVASC) 10 MG tablet TAKE 1 TABLET(10 MG) BY MOUTH DAILY 90 tablet 1   ibuprofen (ADVIL) 600 MG tablet Take 1 tablet (600 mg total) by mouth every 8 (eight) hours as needed for moderate pain or mild pain. 90 tablet 1   topiramate (TOPAMAX) 25 MG tablet Take 1 tablet (25 mg total) by mouth 2 (two) times daily. 90 tablet 1   No current facility-administered medications on file prior to visit.    Review of Systems     Objective:  There were no vitals filed for this visit. BP Readings from Last 3 Encounters:  10/26/22 127/78  10/13/22 125/81  09/28/22 122/86   Wt Readings from Last 3 Encounters:  10/26/22 167 lb (75.8 kg)  10/13/22 165 lb (74.8 kg)  09/28/22 168 lb 6 oz (76.4 kg)   There is no height or weight on file to calculate BMI.    Physical Exam         Assessment & Plan:    See Problem List for Assessment and Plan of chronic medical problems.

## 2022-11-14 ENCOUNTER — Encounter: Payer: Self-pay | Admitting: Internal Medicine

## 2022-11-14 ENCOUNTER — Ambulatory Visit: Payer: Self-pay | Admitting: Internal Medicine

## 2022-11-14 ENCOUNTER — Ambulatory Visit: Payer: 59 | Admitting: Internal Medicine

## 2022-11-14 VITALS — BP 122/84 | HR 82 | Temp 98.0°F | Ht 67.0 in | Wt 172.0 lb

## 2022-11-14 DIAGNOSIS — M7989 Other specified soft tissue disorders: Secondary | ICD-10-CM

## 2022-11-14 DIAGNOSIS — I10 Essential (primary) hypertension: Secondary | ICD-10-CM

## 2022-11-14 NOTE — Assessment & Plan Note (Signed)
Acute Last weekend experience bilateral feet and lower leg swelling Started after work She does stand all day, but this is not new Denies increased salt intake for other potential causes No associated shortness of breath or other symptoms Swelling has resolved Typically wears compression socks and is compliant with low-sodium diet Since symptoms have resolved would not recommend changing any medications at this time Discussed mild venous insufficiency from standing all day for years in addition to the amlodipine could be contributing-no need to address unless this recurs

## 2022-11-14 NOTE — Assessment & Plan Note (Signed)
Chronic Blood pressure well-controlled here today Has been on amlodipine for a while without issues, but discussed this may have contributed some to her leg swelling Since leg swelling has resolved and blood pressure is well-controlled and she has tolerated this medication no changes today If leg swelling recurs may consider decreasing amlodipine and adding another medication Continue amlodipine 10 mg daily

## 2022-11-14 NOTE — Patient Instructions (Addendum)
? ? ? ?  ? ? ?  Medications changes include :  none  ? ? ? ? ? ?Return if symptoms worsen or fail to improve. ? ?

## 2022-11-14 NOTE — Progress Notes (Signed)
Subjective:    Patient ID: [name], female    DOB: 1970-11-07, 52 y.o.   MRN: [id number]      HPI [name] is here for No chief complaint on file.   Feet swelling - started last Friday after getting out of work.  She stands all day, which is not new.  The swelling was getting worse and her legs were starting to get swollen.  The swelling lasted all weekend.  She did elevate her legs a lot and that helped.  Her legs and feet are back to normal,but she can intermittently feel tingling like the swelling is going to start.  She denies any prior episodes.  Complaint with low sodium diet - did not have any salt prior to the swelling.  No new medications or supplements.  No shortness of breath.   Medications and allergies reviewed with patient and updated if appropriate.  Current Outpatient Medications on File Prior to Visit  Medication Sig Dispense Refill   acetaminophen (TYLENOL) 650 MG CR tablet Take 650 mg by mouth every 8 (eight) hours as needed for pain.     amLODipine (NORVASC) 10 MG tablet TAKE 1 TABLET(10 MG) BY MOUTH DAILY 90 tablet 1   topiramate (TOPAMAX) 25 MG tablet Take 1 tablet (25 mg total) by mouth 2 (two) times daily. 90 tablet 1   ibuprofen (ADVIL) 600 MG tablet Take 1 tablet (600 mg total) by mouth every 8 (eight) hours as needed for moderate pain or mild pain. (Patient not taking: Reported on 11/14/2022) 90 tablet 1   No current facility-administered medications on file prior to visit.    Review of Systems  Constitutional:  Negative for fever.  Respiratory:  Negative for cough, shortness of breath and wheezing.   Cardiovascular:  Positive for leg swelling. Negative for chest pain and palpitations.  Neurological:  Negative for light-headedness and headaches.       Objective:   Vitals:   11/14/22 1321  BP: 122/84  Pulse: 82  Temp: 98 F (36.7 C)  SpO2: 98%   BP Readings from Last 3 Encounters:  11/14/22 122/84  10/26/22 127/78  10/13/22  125/81   Wt Readings from Last 3 Encounters:  11/14/22 172 lb (78 kg)  10/26/22 167 lb (75.8 kg)  10/13/22 165 lb (74.8 kg)   Body mass index is 26.94 kg/m.    Physical Exam Constitutional:      General: She is not in acute distress.    Appearance: Normal appearance.  HENT:     Head: Normocephalic and atraumatic.  Eyes:     Conjunctiva/sclera: Conjunctivae normal.  Cardiovascular:     Rate and Rhythm: Normal rate and regular rhythm.     Heart sounds: Normal heart sounds.  Pulmonary:     Effort: Pulmonary effort is normal. No respiratory distress.     Breath sounds: Normal breath sounds. No wheezing.  Musculoskeletal:     Cervical back: Neck supple.     Right lower leg: No edema.     Left lower leg: No edema.  Lymphadenopathy:     Cervical: No cervical adenopathy.  Skin:    General: Skin is warm and dry.     Findings: No rash.  Neurological:     Mental Status: She is alert. Mental status is at baseline.  Psychiatric:        Mood and Affect: Mood normal.        Behavior: Behavior normal.  Assessment & Plan:    See Problem List for Assessment and Plan of chronic medical problems.

## 2022-11-28 ENCOUNTER — Telehealth: Payer: Self-pay

## 2022-11-28 LAB — JAK2 (INCLUDING V617F AND EXON 12), MPL,& CALR-NEXT GEN SEQ

## 2022-11-28 NOTE — Telephone Encounter (Signed)
Spoke with pt and relayed message below per MD Truett Perna. Pt verbalizes understanding and agrees with plan of care.

## 2022-11-28 NOTE — Telephone Encounter (Signed)
-----   Message from Ladene Artist, MD sent at 11/28/2022  4:19 PM EDT ----- Please call patient, the myeloproliferative disorder panel is negative, follow-up as scheduled

## 2022-12-29 ENCOUNTER — Other Ambulatory Visit: Payer: Self-pay | Admitting: Nurse Practitioner

## 2022-12-29 DIAGNOSIS — G43919 Migraine, unspecified, intractable, without status migrainosus: Secondary | ICD-10-CM

## 2023-03-02 ENCOUNTER — Other Ambulatory Visit: Payer: Self-pay | Admitting: Nurse Practitioner

## 2023-03-02 DIAGNOSIS — Z1212 Encounter for screening for malignant neoplasm of rectum: Secondary | ICD-10-CM

## 2023-03-02 DIAGNOSIS — Z1211 Encounter for screening for malignant neoplasm of colon: Secondary | ICD-10-CM

## 2023-03-13 LAB — COLOGUARD

## 2023-03-14 ENCOUNTER — Other Ambulatory Visit: Payer: Self-pay | Admitting: Nurse Practitioner

## 2023-03-14 DIAGNOSIS — G43919 Migraine, unspecified, intractable, without status migrainosus: Secondary | ICD-10-CM

## 2023-03-18 DIAGNOSIS — Z1211 Encounter for screening for malignant neoplasm of colon: Secondary | ICD-10-CM | POA: Diagnosis not present

## 2023-03-18 DIAGNOSIS — Z1212 Encounter for screening for malignant neoplasm of rectum: Secondary | ICD-10-CM | POA: Diagnosis not present

## 2023-03-26 ENCOUNTER — Other Ambulatory Visit: Payer: Self-pay | Admitting: Nurse Practitioner

## 2023-03-26 DIAGNOSIS — I10 Essential (primary) hypertension: Secondary | ICD-10-CM

## 2023-03-29 LAB — COLOGUARD: COLOGUARD: NEGATIVE

## 2023-03-30 ENCOUNTER — Ambulatory Visit: Payer: Self-pay | Admitting: Nurse Practitioner

## 2023-04-12 ENCOUNTER — Ambulatory Visit (INDEPENDENT_AMBULATORY_CARE_PROVIDER_SITE_OTHER): Payer: 59 | Admitting: Nurse Practitioner

## 2023-04-12 VITALS — BP 146/82 | HR 92 | Temp 97.9°F | Ht 67.0 in | Wt 170.2 lb

## 2023-04-12 DIAGNOSIS — J029 Acute pharyngitis, unspecified: Secondary | ICD-10-CM

## 2023-04-12 DIAGNOSIS — I10 Essential (primary) hypertension: Secondary | ICD-10-CM | POA: Diagnosis not present

## 2023-04-12 DIAGNOSIS — K649 Unspecified hemorrhoids: Secondary | ICD-10-CM | POA: Insufficient documentation

## 2023-04-12 LAB — POCT RESPIRATORY SYNCYTIAL VIRUS: RSV Rapid Ag: NEGATIVE

## 2023-04-12 LAB — POC COVID19 BINAXNOW: SARS Coronavirus 2 Ag: NEGATIVE

## 2023-04-12 LAB — POCT INFLUENZA A/B
Influenza A, POC: NEGATIVE
Influenza B, POC: NEGATIVE

## 2023-04-12 LAB — POCT RAPID STREP A (OFFICE): Rapid Strep A Screen: NEGATIVE

## 2023-04-12 MED ORDER — HYDROCORTISONE ACETATE 25 MG RE SUPP
25.0000 mg | Freq: Two times a day (BID) | RECTAL | 0 refills | Status: DC
Start: 2023-04-12 — End: 2023-06-29

## 2023-04-12 NOTE — Assessment & Plan Note (Signed)
Chronic, above goal today Normally well controlled. Likely elevated due to discomfort with sore throat. For now, continue on amlodipine 10mg /day.

## 2023-04-12 NOTE — Progress Notes (Signed)
Established Patient Office Visit  Subjective   Patient ID: [name], female    DOB: Oct 26, 1970  Age: 52 y.o. MRN: [id number]  Chief Complaint  Patient presents with   Sore Throat    Patient has today for follow-up. Reports that the swelling in her legs as well as most of her joint pain has actually resolved.  She found on TikTok a recipe for a kind of salve that includes soaking an onion in rubbing alcohol, letting it sit for 7 days and then applying it topically to her joints.  Using this has resulted in pain relief.  She does report that yesterday she started experiencing mild sore throat with cough.  She also has noticed feeling mild chills.  No shortness of breath.  She also mentions that she gets hemorrhoids that will swell and cause some mild discomfort.  She has been applying over-the-counter Preparation H as well as Tucks pads but has had mild to no relief and would like to discuss other treatment options.    Review of Systems  Constitutional:  Positive for chills.  HENT:  Positive for sore throat.   Respiratory:  Positive for cough. Negative for shortness of breath.   Gastrointestinal:  Negative for blood in stool.      Objective:     BP (!) 146/82   Pulse 92   Temp 97.9 F (36.6 C) (Temporal)   Ht 5\' 7"  (1.702 m)   Wt 170 lb 4 oz (77.2 kg)   SpO2 97%   BMI 26.66 kg/m  BP Readings from Last 3 Encounters:  04/12/23 (!) 146/82  11/14/22 122/84  10/26/22 127/78   Wt Readings from Last 3 Encounters:  04/12/23 170 lb 4 oz (77.2 kg)  11/14/22 172 lb (78 kg)  10/26/22 167 lb (75.8 kg)      Physical Exam Vitals reviewed.  Constitutional:      General: She is not in acute distress.    Appearance: Normal appearance.  HENT:     Head: Normocephalic and atraumatic.     Mouth/Throat:     Pharynx: Posterior oropharyngeal erythema present. No pharyngeal swelling or oropharyngeal exudate.  Neck:     Vascular: No carotid bruit.  Cardiovascular:     Rate  and Rhythm: Normal rate and regular rhythm.     Pulses: Normal pulses.     Heart sounds: Normal heart sounds.  Pulmonary:     Effort: Pulmonary effort is normal.     Breath sounds: Normal breath sounds.  Skin:    General: Skin is warm and dry.  Neurological:     General: No focal deficit present.     Mental Status: She is alert and oriented to person, place, and time.  Psychiatric:        Mood and Affect: Mood normal.        Behavior: Behavior normal.        Judgment: Judgment normal.      Results for orders placed or performed in visit on 04/12/23  POC COVID-19 BinaxNow  Result Value Ref Range   SARS Coronavirus 2 Ag Negative Negative  POCT rapid strep A  Result Value Ref Range   Rapid Strep A Screen Negative Negative  POCT Influenza A/B  Result Value Ref Range   Influenza A, POC Negative Negative   Influenza B, POC Negative Negative  POCT respiratory syncytial virus  Result Value Ref Range   RSV Rapid Ag negative       The 10-year  ASCVD risk score (Arnett DK, et al., 2019) is: 14.4%    Assessment & Plan:   Problem List Items Addressed This Visit       Cardiovascular and Mediastinum   Essential hypertension    Chronic, above goal today Normally well controlled. Likely elevated due to discomfort with sore throat. For now, continue on amlodipine 10mg /day.       Hemorrhoids    Acute, intermittent Anusol-HC suppositories sent to pharmacy for patient to use BID as needed for hemorrhoidal flare-ups.      Relevant Medications   hydrocortisone (ANUSOL-HC) 25 MG suppository     Respiratory   Pharyngitis - Primary    Acute POC strep, covid, RSV, flu: negative Treat symptomatically with OTC antihistamine, tylenol or ibuprofen as needed, hydration, and rest. Return to clinic if symptoms worsen or persist.       Relevant Orders   POC COVID-19 BinaxNow (Completed)   POCT rapid strep A (Completed)   POCT Influenza A/B (Completed)   POCT respiratory syncytial  virus (Completed)    Return in about 3 months (around 07/13/2023) for 3-6 months for CPE with Maralyn Sago.    Elenore Paddy, NP

## 2023-04-12 NOTE — Assessment & Plan Note (Signed)
Acute, intermittent Anusol-HC suppositories sent to pharmacy for patient to use BID as needed for hemorrhoidal flare-ups.

## 2023-04-12 NOTE — Assessment & Plan Note (Addendum)
Acute POC strep, covid, RSV, flu: negative Treat symptomatically with OTC antihistamine, tylenol or ibuprofen as needed, hydration, and rest. Return to clinic if symptoms worsen or persist.

## 2023-04-15 ENCOUNTER — Encounter: Payer: Self-pay | Admitting: Nurse Practitioner

## 2023-04-16 ENCOUNTER — Telehealth: Payer: Self-pay | Admitting: Nurse Practitioner

## 2023-04-16 MED ORDER — AZITHROMYCIN 250 MG PO TABS: ORAL_TABLET | ORAL | 1 refills | Status: AC

## 2023-04-16 NOTE — Telephone Encounter (Signed)
Ok antibx zpack done erx

## 2023-04-16 NOTE — Telephone Encounter (Signed)
Pt called this morning pt is coughing and has barely a voice to speak pt was seen last week for this and [name] stated if she worsen to call back to get something prescribed. Can we please send pt in something to help her.

## 2023-05-29 ENCOUNTER — Other Ambulatory Visit: Payer: Self-pay | Admitting: Family

## 2023-05-29 DIAGNOSIS — G43919 Migraine, unspecified, intractable, without status migrainosus: Secondary | ICD-10-CM

## 2023-06-08 ENCOUNTER — Other Ambulatory Visit: Payer: Self-pay

## 2023-06-08 DIAGNOSIS — G43919 Migraine, unspecified, intractable, without status migrainosus: Secondary | ICD-10-CM

## 2023-06-08 MED ORDER — TOPIRAMATE 25 MG PO TABS: 25.0000 mg | ORAL_TABLET | Freq: Two times a day (BID) | ORAL | 0 refills | Status: DC

## 2023-06-29 ENCOUNTER — Other Ambulatory Visit: Payer: 59

## 2023-06-29 ENCOUNTER — Inpatient Hospital Stay: Payer: 59

## 2023-06-29 ENCOUNTER — Inpatient Hospital Stay: Payer: 59 | Attending: Oncology | Admitting: Oncology

## 2023-06-29 VITALS — BP 127/88 | HR 83 | Temp 98.1°F | Resp 18 | Ht 67.0 in | Wt 162.5 lb

## 2023-06-29 DIAGNOSIS — Z720 Tobacco use: Secondary | ICD-10-CM | POA: Diagnosis not present

## 2023-06-29 DIAGNOSIS — D75839 Thrombocytosis, unspecified: Secondary | ICD-10-CM | POA: Diagnosis not present

## 2023-06-29 DIAGNOSIS — M1711 Unilateral primary osteoarthritis, right knee: Secondary | ICD-10-CM | POA: Insufficient documentation

## 2023-06-29 DIAGNOSIS — Z90710 Acquired absence of both cervix and uterus: Secondary | ICD-10-CM | POA: Diagnosis not present

## 2023-06-29 DIAGNOSIS — I10 Essential (primary) hypertension: Secondary | ICD-10-CM | POA: Diagnosis not present

## 2023-06-29 LAB — CBC WITH DIFFERENTIAL (CANCER CENTER ONLY)
Abs Immature Granulocytes: 0.02 10*3/uL (ref 0.00–0.07)
Basophils Absolute: 0 10*3/uL (ref 0.0–0.1)
Basophils Relative: 0 %
Eosinophils Absolute: 0.1 10*3/uL (ref 0.0–0.5)
Eosinophils Relative: 1 %
HCT: 40.3 % (ref 36.0–46.0)
Hemoglobin: 13.1 g/dL (ref 12.0–15.0)
Immature Granulocytes: 0 %
Lymphocytes Relative: 39 %
Lymphs Abs: 3.1 10*3/uL (ref 0.7–4.0)
MCH: 28.1 pg (ref 26.0–34.0)
MCHC: 32.5 g/dL (ref 30.0–36.0)
MCV: 86.3 fL (ref 80.0–100.0)
Monocytes Absolute: 0.7 10*3/uL (ref 0.1–1.0)
Monocytes Relative: 8 %
Neutro Abs: 4 10*3/uL (ref 1.7–7.7)
Neutrophils Relative %: 52 %
Platelet Count: 432 10*3/uL — ABNORMAL HIGH (ref 150–400)
RBC: 4.67 MIL/uL (ref 3.87–5.11)
RDW: 13.2 % (ref 11.5–15.5)
WBC Count: 7.9 10*3/uL (ref 4.0–10.5)
nRBC: 0 % (ref 0.0–0.2)

## 2023-06-29 NOTE — Progress Notes (Signed)
  [facility] OFFICE PROGRESS NOTE   Diagnosis: Thrombocytosis  INTERVAL HISTORY:   [name] returns as scheduled.  No bleeding or symptom of thrombosis.  Good appetite.  She complains of joint stiffness and pain.  She is stiff in the mornings and in the evening.  She reports the deafness and arthralgias improved while she is at work.  She uses ice, a heating pad, and apical analgesics as needed.  No joint swelling or erythema.  Objective:  Vital signs in last 24 hours:  Blood pressure 127/88, pulse 83, temperature 98.1 F (36.7 C), temperature source Temporal, resp. rate 18, height 5\' 7"  (1.702 m), weight 162 lb 8 oz (73.7 kg), SpO2 100%.    Resp: Lungs clear bilaterally Cardio: Regular rate and rhythm GI: No hepatosplenomegaly Vascular: No leg edema Musculoskeletal: Hands, wrists, knees, and ankles without erythema or swelling  Lab Results:  Lab Results  Component Value Date   WBC 7.9 06/29/2023   HGB 13.1 06/29/2023   HCT 40.3 06/29/2023   MCV 86.3 06/29/2023   PLT 432 (H) 06/29/2023   NEUTROABS 4.0 06/29/2023    CMP  Lab Results  Component Value Date   NA 136 09/28/2022   K 3.9 09/28/2022   CL 104 09/28/2022   CO2 22 09/28/2022   GLUCOSE 88 09/28/2022   BUN 13 09/28/2022   CREATININE 0.80 09/28/2022   CALCIUM 9.5 09/28/2022   PROT 8.0 09/28/2022   ALBUMIN 4.5 09/28/2022   AST 24 09/28/2022   ALT 21 09/28/2022   ALKPHOS 78 09/28/2022   BILITOT 0.3 09/28/2022   GFRNONAA >60 10/28/2021   GFRAA >60 08/03/2017    No results found for: "CEA1", "CEA", "NGE952", "CA125"  No results found for: "INR", "LABPROT"  Imaging:  No results found.  Medications: I have reviewed the patient's current medications.   Assessment/Plan:  Thrombocytosis Chronic mild elevation i of the platelet count dating to 2016 Negative myeloproliferative panel 10/26/2022 Degenerative arthritis of the right knee Hypertension History of migraine headaches G5,  P5 Hysterectomy in 2008 for menorrhagia Ongoing tobacco use History of left shoulder adhesive capsulitis History of gout   Disposition: [name] has chronic mild thrombocytosis.  This is likely a benign normal variant versus reactive thrombocytosis due to inflammation.  She could have an undefined collagen vascular disease, though the erythrocyte sedimentation rate was normal last year and she has undergone previous negative testing for rheumatoid arthritis and SLE.  A myeloproliferative panel was negative last year.  I recommended she follow-up with Jiles Prows.  I recommend a referral to rheumatology.  [name] would like to continue follow-up in the hematology clinic.  She will return for an office visit and CBC in 1 year.  I am available to see her sooner as needed.  I recommended she discontinue smoking.  Thornton Papas, MD  06/29/2023  9:34 AM

## 2023-07-13 ENCOUNTER — Ambulatory Visit (INDEPENDENT_AMBULATORY_CARE_PROVIDER_SITE_OTHER): Payer: 59 | Admitting: Nurse Practitioner

## 2023-07-13 VITALS — BP 140/96 | HR 93 | Temp 98.3°F | Ht 67.0 in | Wt 170.0 lb

## 2023-07-13 DIAGNOSIS — Z1239 Encounter for other screening for malignant neoplasm of breast: Secondary | ICD-10-CM | POA: Diagnosis not present

## 2023-07-13 DIAGNOSIS — Z131 Encounter for screening for diabetes mellitus: Secondary | ICD-10-CM | POA: Insufficient documentation

## 2023-07-13 DIAGNOSIS — M1711 Unilateral primary osteoarthritis, right knee: Secondary | ICD-10-CM | POA: Diagnosis not present

## 2023-07-13 DIAGNOSIS — G43919 Migraine, unspecified, intractable, without status migrainosus: Secondary | ICD-10-CM | POA: Diagnosis not present

## 2023-07-13 DIAGNOSIS — Z1159 Encounter for screening for other viral diseases: Secondary | ICD-10-CM | POA: Diagnosis not present

## 2023-07-13 DIAGNOSIS — Z0000 Encounter for general adult medical examination without abnormal findings: Secondary | ICD-10-CM | POA: Diagnosis not present

## 2023-07-13 DIAGNOSIS — Z13220 Encounter for screening for lipoid disorders: Secondary | ICD-10-CM | POA: Diagnosis not present

## 2023-07-13 DIAGNOSIS — Z0001 Encounter for general adult medical examination with abnormal findings: Secondary | ICD-10-CM | POA: Insufficient documentation

## 2023-07-13 LAB — COMPREHENSIVE METABOLIC PANEL
ALT: 18 U/L (ref 0–35)
AST: 21 U/L (ref 0–37)
Albumin: 4.6 g/dL (ref 3.5–5.2)
Alkaline Phosphatase: 88 U/L (ref 39–117)
BUN: 12 mg/dL (ref 6–23)
CO2: 24 meq/L (ref 19–32)
Calcium: 9.6 mg/dL (ref 8.4–10.5)
Chloride: 107 meq/L (ref 96–112)
Creatinine, Ser: 0.82 mg/dL (ref 0.40–1.20)
GFR: 82.34 mL/min (ref >60.00)
Glucose, Bld: 92 mg/dL (ref 70–99)
Potassium: 3.9 meq/L (ref 3.5–5.1)
Sodium: 139 meq/L (ref 135–145)
Total Bilirubin: 0.4 mg/dL (ref 0.2–1.2)
Total Protein: 7.9 g/dL (ref 6.0–8.3)

## 2023-07-13 LAB — LIPID PANEL
Cholesterol: 223 mg/dL — ABNORMAL HIGH (ref 0–200)
HDL: 54.1 mg/dL (ref >39.00)
LDL Cholesterol: 154 mg/dL — ABNORMAL HIGH (ref 0–99)
NonHDL: 168.48
Total CHOL/HDL Ratio: 4
Triglycerides: 74 mg/dL (ref 0.0–149.0)
VLDL: 14.8 mg/dL (ref 0.0–40.0)

## 2023-07-13 LAB — HEMOGLOBIN A1C: Hgb A1c MFr Bld: 5.8 % (ref 4.6–6.5)

## 2023-07-13 LAB — TSH: TSH: 0.86 u[IU]/mL (ref 0.35–5.50)

## 2023-07-13 MED ORDER — TOPIRAMATE 25 MG PO TABS: 25.0000 mg | ORAL_TABLET | Freq: Two times a day (BID) | ORAL | 3 refills | Status: DC

## 2023-07-13 MED ORDER — MELOXICAM 7.5 MG PO TABS: 7.5000 mg | ORAL_TABLET | Freq: Every day | ORAL | 2 refills | Status: DC

## 2023-07-13 NOTE — Assessment & Plan Note (Signed)
Labs ordered, further recommendations may be made based upon these results

## 2023-07-13 NOTE — Assessment & Plan Note (Signed)
Labs ordered, further recommendations may be made based upon his results.

## 2023-07-13 NOTE — Assessment & Plan Note (Signed)
Healthy lifestyle discussed, screening recommendations made.  Screening mammogram ordered today. Recommend discussing shingles vaccine at pharmacy with pharmacist.

## 2023-07-13 NOTE — Assessment & Plan Note (Signed)
Chronic Will order meloxicam 7.5 mg tablets to be taken once a day as needed.  Patient told to avoid all other NSAIDs while taking meloxicam.  She reports understanding. Follow-up in 3 months.

## 2023-07-13 NOTE — Assessment & Plan Note (Signed)
Screening mammogram ordered today

## 2023-07-13 NOTE — Progress Notes (Addendum)
Complete physical exam  Patient: [name]   DOB: [birth year]   53 y.o. Female  MRN: [id number]  Subjective:    Chief Complaint  Patient presents with   Annual Exam    [name] is a 53 y.o. female who presents today for a complete physical exam. She reports consuming a general diet.  Exercise: HEP, yard work when weather is nice  She generally feels fairly well. She reports sleeping fairly well. She does have additional problems to discuss today.   Arthritis/migraine: Requesting refill on topiramate that she takes for preventative treatment of migraine.  Patient is tolerating medication well.  She also reports history of osteoarthritis that causes her pain especially related to her work. She has tried and failed Tylenol, ibuprofen, Aleve.  She works at [employer] and is on her feet for long periods of time which can result in exacerbation of pain.  She also discussed as needed medication options.  Most recent fall risk assessment:    07/13/2023    1:57 PM  Fall Risk   Falls in the past year? 0  Number falls in past yr: 0  Injury with Fall? 0  Risk for fall due to : No Fall Risks  Follow up Falls evaluation completed     Most recent depression screenings:    07/13/2023    1:57 PM 11/14/2022    1:51 PM  PHQ 2/9 Scores  PHQ - 2 Score 0 0      Past Medical History:  Diagnosis Date   Gout    Hypertension    Migraines    Past Surgical History:  Procedure Laterality Date   ABDOMINAL HYSTERECTOMY  2008   Social History   Socioeconomic History   Marital status: Single    Spouse name: Not on file   Number of children: Not on file   Years of education: Not on file   Highest education level: 11th grade  Occupational History   Not on file  Tobacco Use   Smoking status: Every Day    Current packs/day: 0.10    Types: Cigarettes   Smokeless tobacco: Never   Tobacco comments:    pack lasts 3 weeks  Vaping Use   Vaping status: Never Used  Substance and  Sexual Activity   Alcohol use: Not Currently    Comment: occasional   Drug use: No   Sexual activity: Not Currently    Birth control/protection: Surgical  Other Topics Concern   Not on file  Social History Narrative   Not on file   Social Drivers of Health   Financial Resource Strain: Medium Risk (07/13/2023)   Overall Financial Resource Strain (CARDIA)    Difficulty of Paying Living Expenses: Somewhat hard  Food Insecurity: Food Insecurity Present (07/13/2023)   Hunger Vital Sign    Worried About Running Out of Food in the Last Year: Sometimes true    Ran Out of Food in the Last Year: Sometimes true  Transportation Needs: No Transportation Needs (07/13/2023)   PRAPARE - Administrator, Civil Service (Medical): No    Lack of Transportation (Non-Medical): No  Physical Activity: Sufficiently Active (07/13/2023)   Exercise Vital Sign    Days of Exercise per Week: 6 days    Minutes of Exercise per Session: 30 min  Stress: No Stress Concern Present (07/13/2023)   [employer] of Occupational Health - Occupational Stress Questionnaire    Feeling of Stress : Not at all  Social Connections:  Socially Isolated (07/13/2023)   Social Connection and Isolation Panel [NHANES]    Frequency of Communication with Friends and Family: More than three times a week    Frequency of Social Gatherings with Friends and Family: Never    Attends Religious Services: Never    Database administrator or Organizations: No    Attends Engineer, structural: Not on file    Marital Status: Never married  Intimate Partner Violence: Not At Risk (10/26/2022)   Humiliation, Afraid, Rape, and Kick questionnaire    Fear of Current or Ex-Partner: No    Emotionally Abused: No    Physically Abused: No    Sexually Abused: No   Family History  Problem Relation Age of Onset   Hypertension Mother    Healthy Father    Allergies  Allergen Reactions   Xanax [Alprazolam] Itching      Patient Care  Team: Elenore Paddy, NP as PCP - General (Nurse Practitioner)   Outpatient Medications Prior to Visit  Medication Sig   amLODipine (NORVASC) 10 MG tablet Take 1 tablet by mouth once daily   [DISCONTINUED] topiramate (TOPAMAX) 25 MG tablet Take 1 tablet (25 mg total) by mouth 2 (two) times daily.   No facility-administered medications prior to visit.    Review of Systems  Constitutional:  Negative for chills, fever and weight loss.  HENT:  Negative for ear pain and hearing loss.   Eyes:  Negative for blurred vision and double vision.  Respiratory:  Positive for cough. Negative for shortness of breath and wheezing.   Cardiovascular:  Negative for chest pain and palpitations.  Gastrointestinal:  Negative for abdominal pain and blood in stool.  Genitourinary:  Negative for hematuria.  Neurological:  Positive for headaches. Negative for seizures and loss of consciousness.  Psychiatric/Behavioral:  Negative for depression and suicidal ideas.           Objective:     BP (!) 140/96   Pulse 93   Temp 98.3 F (36.8 C) (Temporal)   Ht 5\' 7"  (1.702 m)   Wt 170 lb (77.1 kg)   SpO2 99%   BMI 26.63 kg/m  BP Readings from Last 3 Encounters:  07/13/23 (!) 140/96  06/29/23 127/88  04/12/23 (!) 146/82   Wt Readings from Last 3 Encounters:  07/13/23 170 lb (77.1 kg)  06/29/23 162 lb 8 oz (73.7 kg)  04/12/23 170 lb 4 oz (77.2 kg)      Physical Exam Vitals reviewed. Exam conducted with a chaperone present.  Constitutional:      Appearance: Normal appearance.  HENT:     Head: Normocephalic and atraumatic.     Right Ear: Tympanic membrane, ear canal and external ear normal.     Left Ear: Tympanic membrane, ear canal and external ear normal.  Eyes:     General:        Right eye: No discharge.        Left eye: No discharge.     Extraocular Movements: Extraocular movements intact.     Conjunctiva/sclera: Conjunctivae normal.     Pupils: Pupils are equal, round, and reactive to  light.  Neck:     Vascular: No carotid bruit.  Cardiovascular:     Rate and Rhythm: Normal rate and regular rhythm.     Pulses: Normal pulses.     Heart sounds: Normal heart sounds. No murmur heard. Pulmonary:     Effort: Pulmonary effort is normal.     Breath sounds:  Normal breath sounds.  Chest:  Breasts:    Breasts are symmetrical.     Right: Normal.     Left: Normal.  Abdominal:     General: Abdomen is flat. Bowel sounds are normal. There is no distension.     Palpations: Abdomen is soft. There is no mass.     Tenderness: There is no abdominal tenderness.  Musculoskeletal:        General: No tenderness.     Cervical back: Neck supple. No muscular tenderness.     Right lower leg: No edema.     Left lower leg: No edema.  Lymphadenopathy:     Cervical: No cervical adenopathy.     Upper Body:     Right upper body: No supraclavicular adenopathy.     Left upper body: No supraclavicular adenopathy.  Skin:    General: Skin is warm and dry.  Neurological:     General: No focal deficit present.     Mental Status: She is alert and oriented to person, place, and time.     Motor: No weakness.     Gait: Gait normal.  Psychiatric:        Mood and Affect: Mood normal.        Behavior: Behavior normal.        Judgment: Judgment normal.      No results found for any visits on 07/13/23.     Assessment & Plan:    Routine Health Maintenance and Physical Exam  Immunization History  Administered Date(s) Administered   Influenza,inj,Quad PF,6+ Mos 05/05/2021   Janssen (J&J) SARS-COV-2 Vaccination 08/30/2019   Tdap 11/11/2015    Health Maintenance  Topic Date Due   HIV Screening  Never done   Hepatitis C Screening  Never done   Zoster Vaccines- Shingrix (1 of 2) Never done   COVID-19 Vaccine (2 - Janssen risk series) 09/27/2019   MAMMOGRAM  04/10/2021   INFLUENZA VACCINE  08/27/2023 (Originally 12/28/2022)   Pneumococcal Vaccine 74-76 Years old (1 of 2 - PCV) 07/12/2024  (Originally 04/10/1977)   DTaP/Tdap/Td (2 - Td or Tdap) 11/10/2025   Fecal DNA (Cologuard)  03/17/2026   HPV VACCINES  Aged Out    Discussed health benefits of physical activity, and encouraged her to engage in regular exercise appropriate for her age and condition.  Problem List Items Addressed This Visit       Cardiovascular and Mediastinum   Intractable migraine without status migrainosus   Chronic Well-controlled as long she takes her topiramate Refill of topiramate 25 mg tablets to be taken twice a day ordered and sent to patient's pharmacy today.      Relevant Medications   meloxicam (MOBIC) 7.5 MG tablet   topiramate (TOPAMAX) 25 MG tablet   Other Relevant Orders   Comprehensive metabolic panel   Hemoglobin A1c   Lipid panel   TSH   Hepatitis C antibody     Musculoskeletal and Integument   Osteoarthritis of knee (Right) (Chronic)   Chronic Will order meloxicam 7.5 mg tablets to be taken once a day as needed.  Patient told to avoid all other NSAIDs while taking meloxicam.  She reports understanding. Follow-up in 3 months.       Relevant Medications   meloxicam (MOBIC) 7.5 MG tablet   Other Relevant Orders   Comprehensive metabolic panel   Hemoglobin A1c   Lipid panel   TSH   Hepatitis C antibody     Other   Encounter for screening  for malignant neoplasm of breast   Screening mammogram ordered today      Relevant Orders   MM DIGITAL SCREENING BILATERAL   Comprehensive metabolic panel   Hemoglobin A1c   Lipid panel   TSH   Hepatitis C antibody   Encounter for hepatitis C screening test for low risk patient   Labs ordered, further recommendations may be made based upon these results       Relevant Orders   Comprehensive metabolic panel   Hemoglobin A1c   Lipid panel   TSH   Hepatitis C antibody   Screening for diabetes mellitus   Labs ordered, further recommendations may be made based upon his results       Relevant Orders   Comprehensive  metabolic panel   Hemoglobin A1c   Lipid panel   TSH   Hepatitis C antibody   Encounter for general adult medical examination with abnormal findings - Primary   Healthy lifestyle discussed, screening recommendations made.  Screening mammogram ordered today. Recommend discussing shingles vaccine at pharmacy with pharmacist.      Relevant Orders   Comprehensive metabolic panel   Hemoglobin A1c   Lipid panel   TSH   Hepatitis C antibody   Screening, lipid   Labs ordered, further recommendations may be made based upon his results       Relevant Orders   Comprehensive metabolic panel   Hemoglobin A1c   Lipid panel   TSH   Hepatitis C antibody   Return in about 3 months (around 10/10/2023) for F/U with Konstance Happel.  In addition to annual physical exam also performed office visit as detailed above.     Elenore Paddy, NP

## 2023-07-13 NOTE — Assessment & Plan Note (Signed)
Chronic Well-controlled as long she takes her topiramate Refill of topiramate 25 mg tablets to be taken twice a day ordered and sent to patient's pharmacy today.

## 2023-07-14 LAB — HEPATITIS C ANTIBODY: Hepatitis C Ab: NONREACTIVE

## 2023-07-16 ENCOUNTER — Encounter: Payer: Self-pay | Admitting: Nurse Practitioner

## 2023-07-26 ENCOUNTER — Ambulatory Visit
Admission: RE | Admit: 2023-07-26 | Discharge: 2023-07-26 | Disposition: A | Discharge status: Home / Self Care | Payer: 59 | Source: Ambulatory Visit | Attending: Nurse Practitioner | Admitting: Nurse Practitioner

## 2023-07-26 DIAGNOSIS — Z1239 Encounter for other screening for malignant neoplasm of breast: Secondary | ICD-10-CM

## 2023-08-01 ENCOUNTER — Encounter: Payer: Self-pay | Admitting: Nurse Practitioner

## 2023-08-11 ENCOUNTER — Encounter (HOSPITAL_COMMUNITY): Payer: Self-pay

## 2023-08-11 ENCOUNTER — Ambulatory Visit (HOSPITAL_COMMUNITY)
Admission: EM | Admit: 2023-08-11 | Discharge: 2023-08-11 | Disposition: A | Discharge status: Home / Self Care | Attending: Emergency Medicine | Admitting: Emergency Medicine

## 2023-08-11 DIAGNOSIS — Z113 Encounter for screening for infections with a predominantly sexual mode of transmission: Secondary | ICD-10-CM | POA: Insufficient documentation

## 2023-08-11 NOTE — Discharge Instructions (Signed)
 Your results will come back over the next few days and someone will call if results are positive and require treatment.  Return here as needed.

## 2023-08-11 NOTE — ED Triage Notes (Signed)
 Patient here today to be tested for Stds after being told by her partner that he has been itching. Patient denies any symptoms.

## 2023-08-11 NOTE — ED Provider Notes (Signed)
 [facility]    CSN: [id number] Arrival date & time: 08/11/23  1207      History   Chief Complaint Chief Complaint  Patient presents with   SEXUALLY TRANSMITTED DISEASE    HPI [name] is a 53 y.o. female.   Patient presents requesting STD testing.  Patient states that her partner has been having some itching to his groin and she is concerned she may have an STD from this.  Denies any symptoms at this time.  Denies any known exposures.     Past Medical History:  Diagnosis Date   Gout    Hypertension    Migraines     Patient Active Problem List   Diagnosis Date Noted   Encounter for screening for malignant neoplasm of breast 07/13/2023   Encounter for hepatitis C screening test for low risk patient 07/13/2023   Screening for diabetes mellitus 07/13/2023   Encounter for general adult medical examination with abnormal findings 07/13/2023   Screening, lipid 07/13/2023   Hemorrhoids 04/12/2023   Pharyngitis 04/12/2023   Leg swelling 11/14/2022   Chronic pain of right knee 09/28/2022   Adhesive capsulitis of left shoulder 04/06/2022   Acute pain of left shoulder 01/05/2022   Constipation 01/05/2022   Colon cancer screening 01/05/2022   Abnormal MRI, lumbar spine (09/25/2021) 01/02/2022   Chronic hip pain (2ry area of Pain) (Left) 01/02/2022   Chronic low back pain (1ry area of Pain) (Left) w/o sciatica 01/02/2022   Chronic groin pain (3ry area of Pain) (Left) 01/02/2022   Greater trochanteric bursitis (4th area of Pain) (Left) 01/02/2022   Chronic pain syndrome 01/01/2022   Pharmacologic therapy 01/01/2022   Disorder of skeletal system 01/01/2022   Problems influencing health status 01/01/2022   Snoring 09/30/2021   Daytime sleepiness 09/30/2021   Prediabetes 09/30/2021   Tooth abscess 09/30/2021   Encounter for screening mammogram for malignant neoplasm of breast 09/30/2021   HLD (hyperlipidemia) 09/30/2021   PTSD (post-traumatic stress  disorder) 05/05/2021   Strain of lumbar region 05/05/2021   Situational anxiety 05/05/2021   Osteoarthritis of knee (Right) 11/05/2020   Patellofemoral arthritis of knee (Right) 06/21/2018   Cervical radiculopathy 06/21/2018   Essential hypertension 05/14/2018   Tobacco abuse 05/14/2018   Intractable migraine without status migrainosus 05/14/2018   Gout 05/14/2018    Past Surgical History:  Procedure Laterality Date   ABDOMINAL HYSTERECTOMY  2008    OB History   No obstetric history on file.      Home Medications    Prior to Admission medications   Medication Sig Start Date End Date Taking? Authorizing Provider  amLODipine (NORVASC) 10 MG tablet Take 1 tablet by mouth once daily 03/26/23   Elenore Paddy, NP  meloxicam (MOBIC) 7.5 MG tablet Take 1 tablet (7.5 mg total) by mouth daily. 07/13/23   Elenore Paddy, NP  topiramate (TOPAMAX) 25 MG tablet Take 1 tablet (25 mg total) by mouth 2 (two) times daily. 07/13/23   Elenore Paddy, NP    Family History Family History  Problem Relation Age of Onset   Hypertension Mother    Healthy Father     Social History Social History   Tobacco Use   Smoking status: Every Day    Current packs/day: 0.10    Types: Cigarettes   Smokeless tobacco: Never   Tobacco comments:    pack lasts 3 weeks  Vaping Use   Vaping status: Never Used  Substance Use Topics  Alcohol use: Yes    Comment: occasional   Drug use: No     Allergies   Xanax [alprazolam]   Review of Systems Review of Systems  Per HPI  Physical Exam Triage Vital Signs ED Triage Vitals  Encounter Vitals Group     BP 08/11/23 1312 (!) 141/76     Systolic BP Percentile --      Diastolic BP Percentile --      Pulse Rate 08/11/23 1312 98     Resp 08/11/23 1312 16     Temp 08/11/23 1312 97.8 F (36.6 C)     Temp Source 08/11/23 1312 Oral     SpO2 08/11/23 1312 98 %     Weight 08/11/23 1312 170 lb (77.1 kg)     Height 08/11/23 1312 5\' 7"  (1.702 m)     Head  Circumference --      Peak Flow --      Pain Score 08/11/23 1313 0     Pain Loc --      Pain Education --      Exclude from Growth Chart --    No data found.  Updated Vital Signs BP (!) 141/76 (BP Location: Left Arm)   Pulse 98   Temp 97.8 F (36.6 C) (Oral)   Resp 16   Ht 5\' 7"  (1.702 m)   Wt 170 lb (77.1 kg)   SpO2 98%   BMI 26.63 kg/m   Visual Acuity Right Eye Distance:   Left Eye Distance:   Bilateral Distance:    Right Eye Near:   Left Eye Near:    Bilateral Near:     Physical Exam Vitals and nursing note reviewed.  Constitutional:      General: She is awake. She is not in acute distress.    Appearance: Normal appearance. She is well-developed and well-groomed. She is not ill-appearing.  Genitourinary:    Comments: Exam deferred Neurological:     Mental Status: She is alert.  Psychiatric:        Behavior: Behavior is cooperative.      UC Treatments / Results  Labs (all labs ordered are listed, but only abnormal results are displayed) Labs Reviewed  CERVICOVAGINAL ANCILLARY ONLY    EKG   Radiology No results found.  Procedures Procedures (including critical care time)  Medications Ordered in UC Medications - No data to display  Initial Impression / Assessment and Plan / UC Course  I have reviewed the triage vital signs and the nursing notes.  Pertinent labs & imaging results that were available during my care of the patient were reviewed by me and considered in my medical decision making (see chart for details).     No significant findings upon exam.  GU exam deferred.  Patient performed self swab for STD.  Declines HIV and syphilis testing.  Discussed follow-up and return precautions. Final Clinical Impressions(s) / UC Diagnoses   Final diagnoses:  Screening for STD (sexually transmitted disease)     Discharge Instructions      Your results will come back over the next few days and someone will call if results are positive and  require treatment.  Return here as needed.     ED Prescriptions   None    PDMP not reviewed this encounter.   Wynonia Lawman A, NP 08/11/23 1336

## 2023-08-13 LAB — CERVICOVAGINAL ANCILLARY ONLY
Chlamydia: NEGATIVE
Comment: NEGATIVE
Comment: NEGATIVE
Comment: NORMAL
Neisseria Gonorrhea: NEGATIVE
Trichomonas: NEGATIVE

## 2023-10-05 ENCOUNTER — Encounter (HOSPITAL_COMMUNITY): Payer: Self-pay

## 2023-10-12 ENCOUNTER — Ambulatory Visit: Payer: 59 | Admitting: Nurse Practitioner

## 2023-10-12 VITALS — BP 118/78 | HR 76 | Temp 97.8°F | Ht 67.0 in | Wt 166.1 lb

## 2023-10-12 DIAGNOSIS — M1711 Unilateral primary osteoarthritis, right knee: Secondary | ICD-10-CM | POA: Diagnosis not present

## 2023-10-12 DIAGNOSIS — E785 Hyperlipidemia, unspecified: Secondary | ICD-10-CM

## 2023-10-12 DIAGNOSIS — G43919 Migraine, unspecified, intractable, without status migrainosus: Secondary | ICD-10-CM | POA: Diagnosis not present

## 2023-10-12 DIAGNOSIS — I10 Essential (primary) hypertension: Secondary | ICD-10-CM | POA: Diagnosis not present

## 2023-10-12 MED ORDER — TOPIRAMATE 25 MG PO TABS: 25.0000 mg | ORAL_TABLET | Freq: Two times a day (BID) | ORAL | 3 refills | Status: AC

## 2023-10-12 MED ORDER — ATORVASTATIN CALCIUM 10 MG PO TABS: 10.0000 mg | ORAL_TABLET | Freq: Every day | ORAL | 1 refills | Status: DC

## 2023-10-12 MED ORDER — AMLODIPINE BESYLATE 10 MG PO TABS: ORAL_TABLET | ORAL | 3 refills | Status: DC

## 2023-10-12 MED ORDER — MELOXICAM 15 MG PO TABS: 15.0000 mg | ORAL_TABLET | Freq: Every day | ORAL | 1 refills | Status: DC | PRN

## 2023-10-12 NOTE — Assessment & Plan Note (Signed)
 Chronic, pain suboptimally controlled I am agreeable to try meloxicam  15 mg daily as needed.  Prescription sent to pharmacy.

## 2023-10-12 NOTE — Assessment & Plan Note (Signed)
 Chronic, stable, at goal Continue amlodipine  10 mg daily.  Refill sent to pharmacy today.

## 2023-10-12 NOTE — Assessment & Plan Note (Signed)
 Chronic Discussed risk versus benefit of starting medication.  Patient would like to start medication.  Discussed risks and side effects of medication.  Will start atorvastatin 10 mg by mouth daily.  She will follow-up in 3 to 4 months fasting for repeat lipid panel and metabolic panel.  She will reach out if she experiences any significant pain once starting the atorvastatin.

## 2023-10-12 NOTE — Progress Notes (Signed)
 Established Patient Office Visit  Subjective   Patient ID: [name], female    DOB: 18-Dec-1970  Age: 53 y.o. MRN: [id number]  Chief Complaint  Patient presents with   Arthritis    Arthritis: Works at [employer] and is on her feet frequently.  Is experiencing hip and leg pain.  Has been treating with meloxicam  7.5 mg daily as needed as well as Tylenol , heat, ice, rest.  Would like to discuss increasing meloxicam  dose for further pain management.  Hyperlipidemia: Last LDL 154, patient is a current someday smoker.  ASCVD risk score about 6.5%.  Hypertension/migraine: On amlodipine  10 mg daily and topiramate  25 mg twice daily.  Would like refill sent to pharmacy today.         Objective:     BP 118/78   Pulse 76   Temp 97.8 F (36.6 C) (Temporal)   Ht 5\' 7"  (1.702 m)   Wt 166 lb 2 oz (75.4 kg)   BMI 26.02 kg/m  BP Readings from Last 3 Encounters:  10/12/23 118/78  08/11/23 (!) 141/76  07/13/23 (!) 140/96   Wt Readings from Last 3 Encounters:  10/12/23 166 lb 2 oz (75.4 kg)  08/11/23 170 lb (77.1 kg)  07/13/23 170 lb (77.1 kg)      Physical Exam Vitals reviewed.  Constitutional:      General: She is not in acute distress.    Appearance: Normal appearance.  HENT:     Head: Normocephalic and atraumatic.  Cardiovascular:     Rate and Rhythm: Normal rate and regular rhythm.     Pulses: Normal pulses.     Heart sounds: Normal heart sounds.  Pulmonary:     Effort: Pulmonary effort is normal.     Breath sounds: Normal breath sounds.  Skin:    General: Skin is warm and dry.  Neurological:     General: No focal deficit present.     Mental Status: She is alert and oriented to person, place, and time.  Psychiatric:        Mood and Affect: Mood normal.        Behavior: Behavior normal.        Judgment: Judgment normal.      No results found for any visits on 10/12/23.  Last metabolic panel Lab Results  Component Value Date   GLUCOSE 92 07/13/2023    NA 139 07/13/2023   K 3.9 07/13/2023   CL 107 07/13/2023   CO2 24 07/13/2023   BUN 12 07/13/2023   CREATININE 0.82 07/13/2023   GFR 82.34 07/13/2023   CALCIUM  9.6 07/13/2023   PROT 7.9 07/13/2023   ALBUMIN 4.6 07/13/2023   LABGLOB 3.0 01/02/2022   AGRATIO 1.5 01/02/2022   BILITOT 0.4 07/13/2023   ALKPHOS 88 07/13/2023   AST 21 07/13/2023   ALT 18 07/13/2023   ANIONGAP 11 10/28/2021   Last lipids Lab Results  Component Value Date   CHOL 223 (H) 07/13/2023   HDL 54.10 07/13/2023   LDLCALC 154 (H) 07/13/2023   TRIG 74.0 07/13/2023   CHOLHDL 4 07/13/2023      The 78-GNFA ASCVD risk score (Arnett DK, et al., 2019) is: 6.5%    Assessment & Plan:   Problem List Items Addressed This Visit       Cardiovascular and Mediastinum   Essential hypertension   Chronic, stable, at goal Continue amlodipine  10 mg daily.  Refill sent to pharmacy today.      Relevant Medications  atorvastatin (LIPITOR) 10 MG tablet   amLODipine  (NORVASC ) 10 MG tablet   Intractable migraine without status migrainosus   Chronic, stable Continue topiramate  12 5 mg twice daily.  Refill sent to pharmacy today.      Relevant Medications   acetaminophen  (TYLENOL ) 325 MG tablet   meloxicam  (MOBIC ) 15 MG tablet   atorvastatin (LIPITOR) 10 MG tablet   topiramate  (TOPAMAX ) 25 MG tablet   amLODipine  (NORVASC ) 10 MG tablet     Musculoskeletal and Integument   Osteoarthritis of knee (Right) - Primary (Chronic)   Chronic, pain suboptimally controlled I am agreeable to try meloxicam  15 mg daily as needed.  Prescription sent to pharmacy.      Relevant Medications   acetaminophen  (TYLENOL ) 325 MG tablet   meloxicam  (MOBIC ) 15 MG tablet     Other   HLD (hyperlipidemia)   Chronic Discussed risk versus benefit of starting medication.  Patient would like to start medication.  Discussed risks and side effects of medication.  Will start atorvastatin 10 mg by mouth daily.  She will follow-up in 3 to 4  months fasting for repeat lipid panel and metabolic panel.  She will reach out if she experiences any significant pain once starting the atorvastatin.      Relevant Medications   atorvastatin (LIPITOR) 10 MG tablet   amLODipine  (NORVASC ) 10 MG tablet    Return in about 4 months (around 02/12/2024) for F/U with Jovon Winterhalter.    Zorita Hiss, NP

## 2023-10-12 NOTE — Assessment & Plan Note (Signed)
 Chronic, stable Continue topiramate  12 5 mg twice daily.  Refill sent to pharmacy today.

## 2024-02-20 ENCOUNTER — Emergency Department (HOSPITAL_COMMUNITY)
Admission: EM | Admit: 2024-02-20 | Discharge: 2024-02-20 | Disposition: A | Discharge status: Home / Self Care | Attending: Emergency Medicine | Admitting: Emergency Medicine

## 2024-02-20 ENCOUNTER — Other Ambulatory Visit: Payer: Self-pay

## 2024-02-20 ENCOUNTER — Emergency Department (HOSPITAL_COMMUNITY)

## 2024-02-20 ENCOUNTER — Encounter (HOSPITAL_COMMUNITY): Payer: Self-pay

## 2024-02-20 DIAGNOSIS — M25462 Effusion, left knee: Secondary | ICD-10-CM | POA: Diagnosis not present

## 2024-02-20 DIAGNOSIS — M25562 Pain in left knee: Secondary | ICD-10-CM | POA: Diagnosis present

## 2024-02-20 MED ORDER — KETOROLAC TROMETHAMINE 60 MG/2ML IM SOLN
30.0000 mg | Freq: Once | INTRAMUSCULAR | Status: AC
  Administered 2024-02-20: 30 mg via INTRAMUSCULAR
  Filled 2024-02-20: qty 2

## 2024-02-20 MED ORDER — NAPROXEN 500 MG PO TABS: 500.0000 mg | ORAL_TABLET | Freq: Two times a day (BID) | ORAL | 0 refills | Status: DC

## 2024-02-20 NOTE — ED Provider Notes (Signed)
 [place] [facility] AT [facility] Provider Note   CSN: [id number] Arrival date & time: 02/20/24  1859     Patient presents with: Knee Pain   [name] is a 53 y.o. female.   This is a pleasant 53 year old female who is here today with pain in her left knee.  Patient reports that she is concerned that she might have some swelling in her left knee, or some fluid.  She states that she works on her feet at [employer], has to walk all day.  She has had issues with that knee previously, she has not been taking any medications at this time.  No fever, no chills, no history of gout.   Knee Pain      Prior to Admission medications   Medication Sig Start Date End Date Taking? Authorizing Provider  naproxen  (NAPROSYN ) 500 MG tablet Take 1 tablet (500 mg total) by mouth 2 (two) times daily. 02/20/24  Yes Mannie Pac T, DO  acetaminophen  (TYLENOL ) 325 MG tablet Take 650 mg by mouth every 6 (six) hours as needed for mild pain (pain score 1-3) or moderate pain (pain score 4-6).    [provider]  amLODipine  (NORVASC ) 10 MG tablet Take 1 tablet by mouth once daily 10/12/23   Gray, Sarah E, NP  atorvastatin  (LIPITOR) 10 MG tablet Take 1 tablet (10 mg total) by mouth daily. 10/12/23   Elnor Lauraine BRAVO, NP  meloxicam  (MOBIC ) 15 MG tablet Take 1 tablet (15 mg total) by mouth daily as needed for pain. 10/12/23   Elnor Lauraine BRAVO, NP  topiramate  (TOPAMAX ) 25 MG tablet Take 1 tablet (25 mg total) by mouth 2 (two) times daily. 10/12/23   Elnor Lauraine BRAVO, NP    Allergies: Xanax [alprazolam]    Review of Systems  Updated Vital Signs BP (!) 147/107   Pulse 89   Temp (!) 97.5 F (36.4 C) (Oral)   Resp 16   SpO2 100%   Physical Exam Vitals and nursing note reviewed.  HENT:     Head: Normocephalic and atraumatic.  Cardiovascular:     Rate and Rhythm: Normal rate.     Pulses: Normal pulses.  Musculoskeletal:     Comments: Mild swelling of the left knee compared to  right.  No warmth, no erythema.  Pain with passive range of motion.  No deformity.  Skin:    General: Skin is warm.     (all labs ordered are listed, but only abnormal results are displayed) Labs Reviewed - No data to display  EKG: None  Radiology: DG Knee Left Port Result Date: 02/20/2024 CLINICAL DATA:  Pain EXAM: PORTABLE LEFT KNEE - 1-2 VIEW COMPARISON:  None Available. FINDINGS: There is no acute fracture or dislocation. Joint effusion is present. There is a superior patellar spur. Joint spaces are maintained. IMPRESSION: Joint effusion. No acute fracture or dislocation. Electronically Signed   By: Greig Pique M.D.   On: 02/20/2024 19:41     Procedures   Medications Ordered in the ED  ketorolac  (TORADOL ) injection 30 mg (has no administration in time range)                                    Medical Decision Making 53 year old female here today with left knee pain.  Plan -patient's knee appears to have mild joint effusion.  There is no evidence of infection, low suspicion for  gout.  Patient on her feet often, likely an overuse injury.  I considered infectious process, vascular injury with this patient, however exam is not consistent with either etiology.  Will provide her some Toradol  here.  Prescription sent for naproxen .  Return precaution discussed with patient at bedside.  This patient's health care is complicated by the following social determinants of health-lack of access to primary care.  Amount and/or Complexity of Data Reviewed Radiology: ordered.  Risk Prescription drug management.        Final diagnoses:  Acute pain of left knee  Knee effusion, left    ED Discharge Orders          Ordered    naproxen  (NAPROSYN ) 500 MG tablet  2 times daily        02/20/24 2056               Mannie Pac T, DO 02/20/24 2056

## 2024-02-20 NOTE — Discharge Instructions (Addendum)
 You have some fluid around your knee.  This is often caused by overuse.  I recommend putting ice on the area when you get home from work.  You may take naproxen  2 times per day.  You may also take 1000 mg of Tylenol  every 8 hours.  Return to the emergency department for worsening pain, increasing warmth of the knee or fever.

## 2024-02-20 NOTE — ED Triage Notes (Signed)
 Pt presents visa POV c/o left knee pain x1.5 years. Denies injury. Report seen for same before and treated with knee brace and injections.

## 2024-02-20 NOTE — ED Notes (Signed)
 Patient transported to X-ray

## 2024-03-18 ENCOUNTER — Ambulatory Visit (HOSPITAL_COMMUNITY)
Admission: EM | Admit: 2024-03-18 | Discharge: 2024-03-18 | Disposition: A | Discharge status: Home / Self Care | Attending: Physician Assistant | Admitting: Physician Assistant

## 2024-03-18 ENCOUNTER — Encounter (HOSPITAL_COMMUNITY): Payer: Self-pay | Admitting: *Deleted

## 2024-03-18 DIAGNOSIS — M25562 Pain in left knee: Secondary | ICD-10-CM

## 2024-03-18 MED ORDER — PREDNISONE 20 MG PO TABS: 40.0000 mg | ORAL_TABLET | Freq: Every day | ORAL | 0 refills | Status: AC

## 2024-03-18 NOTE — ED Provider Notes (Signed)
 [facility]    CSN: [id number] Arrival date & time: 03/18/24  1649      History   Chief Complaint Chief Complaint  Patient presents with   Knee Pain    HPI [name] is a 53 y.o. female.   Patient presents with left knee pain that started about 4 years ago and has become worse over the last 6 months.  She denies new injury or trauma.  She reports she was seen in the ED about a month ago.  She was given naproxen  which she states provided no relief.  She is wearing a knee sleeve.  She has tried Mobic  in the past with no relief.  She is also using lidocaine patches and ice with no relief.  She reports pain is worse with standing for long periods of time.  Reports her job requires her to stand for long periods of time.  She has been seen by orthopedic in the past, was getting injections but has not been in about a year.    Past Medical History:  Diagnosis Date   Gout    Hypertension    Migraines     Patient Active Problem List   Diagnosis Date Noted   Encounter for screening for malignant neoplasm of breast 07/13/2023   Encounter for hepatitis C screening test for low risk patient 07/13/2023   Screening for diabetes mellitus 07/13/2023   Encounter for general adult medical examination with abnormal findings 07/13/2023   Screening, lipid 07/13/2023   Hemorrhoids 04/12/2023   Pharyngitis 04/12/2023   Leg swelling 11/14/2022   Chronic pain of right knee 09/28/2022   Adhesive capsulitis of left shoulder 04/06/2022   Acute pain of left shoulder 01/05/2022   Constipation 01/05/2022   Colon cancer screening 01/05/2022   Abnormal MRI, lumbar spine (09/25/2021) 01/02/2022   Chronic hip pain (2ry area of Pain) (Left) 01/02/2022   Chronic low back pain (1ry area of Pain) (Left) w/o sciatica 01/02/2022   Chronic groin pain (3ry area of Pain) (Left) 01/02/2022   Greater trochanteric bursitis (4th area of Pain) (Left) 01/02/2022   Chronic pain syndrome 01/01/2022    Pharmacologic therapy 01/01/2022   Disorder of skeletal system 01/01/2022   Problems influencing health status 01/01/2022   Snoring 09/30/2021   Daytime sleepiness 09/30/2021   Prediabetes 09/30/2021   Tooth abscess 09/30/2021   Encounter for screening mammogram for malignant neoplasm of breast 09/30/2021   HLD (hyperlipidemia) 09/30/2021   PTSD (post-traumatic stress disorder) 05/05/2021   Strain of lumbar region 05/05/2021   Situational anxiety 05/05/2021   Osteoarthritis of knee (Right) 11/05/2020   Patellofemoral arthritis of knee (Right) 06/21/2018   Cervical radiculopathy 06/21/2018   Essential hypertension 05/14/2018   Tobacco abuse 05/14/2018   Intractable migraine without status migrainosus 05/14/2018   Gout 05/14/2018    Past Surgical History:  Procedure Laterality Date   ABDOMINAL HYSTERECTOMY  2008    OB History   No obstetric history on file.      Home Medications    Prior to Admission medications   Medication Sig Start Date End Date Taking? Authorizing Provider  amLODipine  (NORVASC ) 10 MG tablet Take 1 tablet by mouth once daily 10/12/23  Yes Elnor Lauraine BRAVO, NP  atorvastatin  (LIPITOR) 10 MG tablet Take 1 tablet (10 mg total) by mouth daily. 10/12/23  Yes Elnor Lauraine BRAVO, NP  predniSONE  (DELTASONE ) 20 MG tablet Take 2 tablets (40 mg total) by mouth daily for 5 days. 03/18/24 03/23/24 Yes Ward,  Harlene PEDLAR, PA-C  topiramate  (TOPAMAX ) 25 MG tablet Take 1 tablet (25 mg total) by mouth 2 (two) times daily. 10/12/23  Yes Elnor Lauraine BRAVO, NP  acetaminophen  (TYLENOL ) 325 MG tablet Take 650 mg by mouth every 6 (six) hours as needed for mild pain (pain score 1-3) or moderate pain (pain score 4-6).    [provider]  meloxicam  (MOBIC ) 15 MG tablet Take 1 tablet (15 mg total) by mouth daily as needed for pain. 10/12/23   Elnor Lauraine BRAVO, NP  naproxen  (NAPROSYN ) 500 MG tablet Take 1 tablet (500 mg total) by mouth 2 (two) times daily. 02/20/24   Mannie Fairy DASEN, DO     Family History Family History  Problem Relation Age of Onset   Hypertension Mother    Healthy Father     Social History Social History   Tobacco Use   Smoking status: Every Day    Current packs/day: 0.10    Types: Cigarettes   Smokeless tobacco: Never   Tobacco comments:    pack lasts 3 weeks  Vaping Use   Vaping status: Never Used  Substance Use Topics   Alcohol use: Yes    Comment: occasional   Drug use: No     Allergies   Xanax [alprazolam]   Review of Systems Review of Systems  Constitutional:  Negative for chills and fever.  HENT:  Negative for ear pain and sore throat.   Eyes:  Negative for pain and visual disturbance.  Respiratory:  Negative for cough and shortness of breath.   Cardiovascular:  Negative for chest pain and palpitations.  Gastrointestinal:  Negative for abdominal pain and vomiting.  Genitourinary:  Negative for dysuria and hematuria.  Musculoskeletal:  Positive for arthralgias (knee pain). Negative for back pain.  Skin:  Negative for color change and rash.  Neurological:  Negative for seizures and syncope.  All other systems reviewed and are negative.    Physical Exam Triage Vital Signs ED Triage Vitals  Encounter Vitals Group     BP 03/18/24 1750 127/79     Girls Systolic BP Percentile --      Girls Diastolic BP Percentile --      Boys Systolic BP Percentile --      Boys Diastolic BP Percentile --      Pulse Rate 03/18/24 1750 75     Resp 03/18/24 1750 16     Temp 03/18/24 1750 98.2 F (36.8 C)     Temp Source 03/18/24 1750 Oral     SpO2 03/18/24 1750 99 %     Weight --      Height --      Head Circumference --      Peak Flow --      Pain Score 03/18/24 1749 10     Pain Loc --      Pain Education --      Exclude from Growth Chart --    No data found.  Updated Vital Signs BP 127/79 (BP Location: Left Arm)   Pulse 75   Temp 98.2 F (36.8 C) (Oral)   Resp 16   SpO2 99%   Visual Acuity Right Eye Distance:    Left Eye Distance:   Bilateral Distance:    Right Eye Near:   Left Eye Near:    Bilateral Near:     Physical Exam Vitals and nursing note reviewed.  Constitutional:      General: She is not in acute distress.    Appearance:  She is well-developed.  HENT:     Head: Normocephalic and atraumatic.  Eyes:     Conjunctiva/sclera: Conjunctivae normal.  Cardiovascular:     Rate and Rhythm: Normal rate and regular rhythm.     Heart sounds: No murmur heard. Pulmonary:     Effort: Pulmonary effort is normal. No respiratory distress.     Breath sounds: Normal breath sounds.  Abdominal:     Palpations: Abdomen is soft.     Tenderness: There is no abdominal tenderness.  Musculoskeletal:        General: No swelling.     Cervical back: Neck supple.     Left knee: Swelling (mild) and bony tenderness present.  Skin:    General: Skin is warm and dry.     Capillary Refill: Capillary refill takes less than 2 seconds.  Neurological:     Mental Status: She is alert.  Psychiatric:        Mood and Affect: Mood normal.      UC Treatments / Results  Labs (all labs ordered are listed, but only abnormal results are displayed) Labs Reviewed - No data to display  EKG   Radiology No results found.  Procedures Procedures (including critical care time)  Medications Ordered in UC Medications - No data to display  Initial Impression / Assessment and Plan / UC Course  I have reviewed the triage vital signs and the nursing notes.  Pertinent labs & imaging results that were available during my care of the patient were reviewed by me and considered in my medical decision making (see chart for details).     Will trial a course of prednisone  over the next few days.  Advised to ultimately follow back up with orthopedics for further management. Final Clinical Impressions(s) / UC Diagnoses   Final diagnoses:  Acute pain of left knee     Discharge Instructions      Continue with  compression. Continue with ice and elevation. Prednisone  as prescribed. Recommend following up with orthopedics.   ED Prescriptions     Medication Sig Dispense Auth. Provider   predniSONE  (DELTASONE ) 20 MG tablet Take 2 tablets (40 mg total) by mouth daily for 5 days. 10 tablet Ward, Jaquisha Frech Z, PA-C      PDMP not reviewed this encounter.   Ward, Harlene PEDLAR, PA-C 03/18/24 [phone]

## 2024-03-18 NOTE — Discharge Instructions (Addendum)
 Continue with compression. Continue with ice and elevation. Prednisone  as prescribed. Recommend following up with orthopedics.

## 2024-03-18 NOTE — ED Triage Notes (Signed)
 Pt states she has left knee pain X 4 years which has got worse X last couple weeks. Pt states she went to ED last week and was given naproxen  which she finished. She is using lidocaine patches, tumeric. She is wearing a knee sleeve.

## 2024-03-25 ENCOUNTER — Encounter: Attending: Physical Medicine & Rehabilitation | Admitting: Physical Medicine & Rehabilitation

## 2024-03-25 ENCOUNTER — Encounter: Payer: Self-pay | Admitting: Physical Medicine & Rehabilitation

## 2024-03-25 VITALS — BP 134/84 | HR 80 | Ht 67.0 in | Wt 166.0 lb

## 2024-03-25 DIAGNOSIS — M25462 Effusion, left knee: Secondary | ICD-10-CM | POA: Insufficient documentation

## 2024-03-25 MED ORDER — DULOXETINE HCL 20 MG PO CPEP
20.0000 mg | ORAL_CAPSULE | Freq: Every day | ORAL | 1 refills | Status: AC
Start: 2024-03-25 — End: ?

## 2024-03-25 NOTE — Progress Notes (Signed)
 Subjective:    Patient ID: [name], female    DOB: Jan 16, 1971, 53 y.o.   MRN: [id number]  HPI Discussed the use of AI scribe software for clinical note transcription with the patient, who gave verbal consent to proceed.  History of Present Illness [name] is a 53 year old female who presents with chronic left knee pain.  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 Her work schedule of 40 hours a week exacerbates her knee pain, which becomes severe by the end of the week.  She has attempted various treatments, including ice packs, heating pads, knee braces, and different types of shoes, with minimal relief. Prednisone  provided some relief, allowing her to bend her knee, which was previously stiff and immobile. However, she is concerned about the side effects of frequent prednisone  use due to her blood pressure medication.  A knee MRI from 2020 did not show significant meniscal damage. She previously consulted with an orthopedist who considered gel injections, but she is unsure of the outcome of that consultation. She experiences swelling and pain, particularly with weather changes, such as rain.  She has tried home remedies, including turmeric pills and dietary changes, such as eliminating red meat and fried foods, to manage her symptoms. Despite these efforts, she continues to experience swelling and pain.  She currently uses a knee sleeve for support and wishes to avoid narcotic pain medications and injections. She is also taking blood pressure medication.  She does not report pain in other joints, such as her hands or wrists or feet.    Pain Inventory Average Pain 10 Pain Right Now 9 My pain is sharp, stabbing, tingling, and aching  In the last 24 hours, has pain interfered with the following? General activity 10 Relation with  others 10 Enjoyment of life 10 What TIME of day is your pain at its worst? daytime Sleep (in general) Poor  Pain is worse with: walking Pain improves with: heat/ice and medication Relief from Meds: 3  Family History  Problem Relation Age of Onset   Hypertension Mother    Healthy Father    Social History   Socioeconomic History   Marital status: Single    Spouse name: Not on file   Number of children: Not on file   Years of education: Not on file   Highest education level: 11th grade  Occupational History   Not on file  Tobacco Use   Smoking status: Every Day    Current packs/day: 0.10    Types: Cigarettes   Smokeless tobacco: Never   Tobacco comments:    pack lasts 3 weeks  Vaping Use   Vaping status: Never Used  Substance and Sexual Activity   Alcohol use: Yes    Comment: occasional   Drug use: No   Sexual activity: Not Currently    Birth control/protection: Surgical  Other Topics Concern   Not on file  Social History Narrative   Not on file   Social Drivers of Health   Financial Resource Strain: Medium Risk (07/13/2023)   Overall Financial Resource Strain (CARDIA)    Difficulty of Paying Living Expenses: Somewhat hard  Food Insecurity: Food Insecurity Present (07/13/2023)   Hunger Vital Sign    Worried About Running Out of Food in the Last Year: Sometimes true    Ran Out of Food in the Last Year: Sometimes true  Transportation Needs: No Transportation Needs (07/13/2023)   PRAPARE - Administrator, Civil Service (Medical): No    Lack of Transportation (Non-Medical): No  Physical Activity: Sufficiently Active (07/13/2023)   Exercise Vital Sign    Days of Exercise per Week: 6 days    Minutes of Exercise per Session: 30 min  Stress: No Stress Concern Present (07/13/2023)   [employer] of Occupational Health - Occupational Stress Questionnaire    Feeling of Stress : Not at all  Social Connections: Socially Isolated (07/13/2023)   Social  Connection and Isolation Panel    Frequency of Communication with Friends and Family: More than three times a week    Frequency of Social Gatherings with Friends and Family: Never    Attends Religious Services: Never    Database Administrator or Organizations: No    Attends Engineer, Structural: Not on file    Marital Status: Never married   Past Surgical History:  Procedure Laterality Date   ABDOMINAL HYSTERECTOMY  2008   Past Surgical History:  Procedure Laterality Date   ABDOMINAL HYSTERECTOMY  2008   Past Medical History:  Diagnosis Date   Gout    Hypertension    Migraines    BP 134/84   Pulse 80   Ht 5' 7 (1.702 m)   Wt 166 lb (75.3 kg)   SpO2 98%   BMI 26.00 kg/m   Opioid Risk Score:   Fall Risk Score:  `1  Depression screen PHQ 2/9     07/13/2023    1:57 PM 11/14/2022    1:51 PM 10/26/2022    2:18 PM 10/13/2022    9:02 AM 09/28/2022    3:25 PM 04/06/2022    1:12 PM 01/05/2022    3:33 PM  Depression screen PHQ 2/9  Decreased Interest 0 0 0 0 0 1 0  Down, Depressed, Hopeless 0 0 0 0 0 0 0  PHQ - 2 Score 0 0 0 0 0 1 0  Altered sleeping      1 0  Tired, decreased energy      1 0  Change in appetite      0 0  Feeling bad or failure about yourself       0 0  Trouble concentrating      0 0  Moving slowly or fidgety/restless      0 0  Suicidal thoughts      0 0  PHQ-9 Score      3 0  Difficult doing work/chores      Extremely dIfficult Not difficult at all      Review of Systems  Musculoskeletal:        Left thigh pain  All other systems reviewed and are negative.      Objective:   Physical Exam        Assessment & Plan:  Assessment and Plan Assessment & Plan Left knee osteoarthritis with effusion and pain, pt reports large and frequent effusions which interfere with knee flexion and work activities Chronic left knee pain with effusion, possibly involving articular cartilage. Previous MRI showed no significant meniscal damage. Prednisone   provided temporary relief but is contraindicated for continuous use Discussed non-narcotic pain management, including duloxetine. - Prescribe duloxetine 20 mg once daily. Increase dose if no improvement after one month. - Refer to Dr. Jerri for orthopedic re-evaluation and possible repeat MRI. - Advise against frequent prednisone  use due to hypertension, risk of osteoporosis, hyperglycemia an  dwt gain. - Discuss weight loss of ~10lb and dietary modifications. - Encourage knee sleeve use and consider topical treatments like Voltaren  gel.   Reviewed knee MRI report, knee xray images

## 2024-03-25 NOTE — Patient Instructions (Signed)
  VISIT SUMMARY: During your visit, we discussed your chronic left knee pain, which worsens with prolonged standing at work. We reviewed your previous treatments and MRI results, and we have developed a new plan to help manage your symptoms.  YOUR PLAN: LEFT KNEE OSTEOARTHRITIS WITH EFFUSION AND PAIN: You have chronic left knee pain with swelling, possibly involving cartilage. Previous MRI showed no significant meniscal damage. -Start taking duloxetine 20 mg once daily. If there is no improvement after one month, we will increase the dose. -You will be referred to Dr. Jerri for an orthopedic evaluation and possibly a repeat MRI. -Avoid frequent use of prednisone  due to your hypertension. -Consider weight loss and dietary modifications to help manage your symptoms. -Continue using your knee sleeve for support and consider using topical treatments like Voltaren  gel.                      Contains text generated by [employer]bridge.                                 Contains text generated by Abridge.

## 2024-05-01 ENCOUNTER — Ambulatory Visit: Admitting: Orthopaedic Surgery

## 2024-05-01 ENCOUNTER — Encounter: Payer: Self-pay | Admitting: Physical Medicine & Rehabilitation

## 2024-05-01 ENCOUNTER — Encounter: Attending: Physical Medicine & Rehabilitation | Admitting: Physical Medicine & Rehabilitation

## 2024-05-01 VITALS — BP 118/81 | HR 79 | Ht 67.0 in | Wt 164.0 lb

## 2024-05-01 DIAGNOSIS — M25462 Effusion, left knee: Secondary | ICD-10-CM | POA: Diagnosis present

## 2024-05-01 DIAGNOSIS — M25562 Pain in left knee: Secondary | ICD-10-CM

## 2024-05-01 DIAGNOSIS — G8929 Other chronic pain: Secondary | ICD-10-CM | POA: Diagnosis not present

## 2024-05-01 MED ORDER — LIDOCAINE HCL 1 % IJ SOLN
2.0000 mL | INTRAMUSCULAR | Status: AC | PRN
  Administered 2024-05-01: 2 mL

## 2024-05-01 MED ORDER — METHYLPREDNISOLONE ACETATE 40 MG/ML IJ SUSP
40.0000 mg | INTRAMUSCULAR | Status: AC | PRN
  Administered 2024-05-01: 40 mg via INTRA_ARTICULAR

## 2024-05-01 MED ORDER — BUPIVACAINE HCL 0.5 % IJ SOLN
2.0000 mL | INTRAMUSCULAR | Status: AC | PRN
  Administered 2024-05-01: 2 mL via INTRA_ARTICULAR

## 2024-05-01 MED ORDER — CELECOXIB 100 MG PO CAPS: 100.0000 mg | ORAL_CAPSULE | Freq: Two times a day (BID) | ORAL | 2 refills | Status: AC

## 2024-05-01 NOTE — Progress Notes (Signed)
 Subjective:    Patient ID: [name], female    DOB: 11-20-1970, 53 y.o.   MRN: [id number]  HPI  Discussed the use of AI scribe software for clinical note transcription with the patient, who gave verbal consent to proceed.  History of Present Illness [name] is a 53 year old female who presents with chronic left knee pain.  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 The pain is described as 'horrible' and is most severe in the mornings. Various treatments have been attempted, including turmeric, dietary changes, limiting red meat and fried foods, and using a knee sleeve, but these have provided limited relief. She has also spent approximately $500 on different pairs of shoes to alleviate the pain without success.  In 2020, an MRI of the left knee did not show any severe damage. She has seen an orthopedist who considered gel injections and recently drained fluid from the knee, injecting cortisone. Approximately 20-25 mL of fluid was drained, and the fluid is being tested.  She started duloxetine  20 mg daily but discontinued it due to severe diarrhea, describing it as 'going to the bathroom like I was a baby.' She has been taking over-the-counter medications such as Excedrin and ibuprofen , but these have not alleviated her pain. She was taking two 200 mg ibuprofen  tablets, totaling 400 mg per dose, without relief.  She works as a therapist, music at [employer], where she stands for extended periods on concrete floors, which exacerbates her knee pain. 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   Pain Inventory Average Pain 10 Pain Right Now 8 My pain is sharp, dull, and aching  In the last 24 hours, has pain interfered with the following? General activity 0 Relation with others 2 Enjoyment of life 0 What TIME of day is  your pain at its worst? morning , daytime, evening, and night Sleep (in general) Poor  Pain is worse with: walking, bending, and standing Pain improves with: no relief Relief from Meds: 0  Family History  Problem Relation Age of Onset   Hypertension Mother    Healthy Father    Social History   Socioeconomic History   Marital status: Single    Spouse name: Not on file   Number of children: Not on file   Years of education: Not on file   Highest education level: 11th grade  Occupational History   Not on file  Tobacco Use   Smoking status: Every Day    Current packs/day: 0.10    Types: Cigarettes   Smokeless tobacco: Never   Tobacco comments:    pack lasts 3 weeks  Vaping Use   Vaping status: Never Used  Substance and Sexual Activity   Alcohol use: Yes    Comment: occasional   Drug use: No   Sexual activity: Not Currently    Birth control/protection: Surgical  Other Topics Concern   Not on file  Social History Narrative   Not on file   Social Drivers of Health   Financial Resource Strain: Medium Risk (07/13/2023)   Overall Financial Resource Strain (CARDIA)    Difficulty of Paying Living Expenses: Somewhat hard  Food Insecurity: Food Insecurity Present (07/13/2023)   Hunger Vital Sign    Worried About Running Out of Food in the Last Year: Sometimes true    Ran Out of Food  in the Last Year: Sometimes true  Transportation Needs: No Transportation Needs (07/13/2023)   PRAPARE - Administrator, Civil Service (Medical): No    Lack of Transportation (Non-Medical): No  Physical Activity: Sufficiently Active (07/13/2023)   Exercise Vital Sign    Days of Exercise per Week: 6 days    Minutes of Exercise per Session: 30 min  Stress: No Stress Concern Present (07/13/2023)   [employer] of Occupational Health - Occupational Stress Questionnaire    Feeling of Stress : Not at all  Social Connections: Socially Isolated (07/13/2023)   Social Connection and  Isolation Panel    Frequency of Communication with Friends and Family: More than three times a week    Frequency of Social Gatherings with Friends and Family: Never    Attends Religious Services: Never    Database Administrator or Organizations: No    Attends Engineer, Structural: Not on file    Marital Status: Never married   Past Surgical History:  Procedure Laterality Date   ABDOMINAL HYSTERECTOMY  2008   Past Surgical History:  Procedure Laterality Date   ABDOMINAL HYSTERECTOMY  2008   Past Medical History:  Diagnosis Date   Gout    Hypertension    Migraines    BP 118/81 (BP Location: Left Arm, Patient Position: Sitting, Cuff Size: Normal)   Pulse 79   Ht 5' 7 (1.702 m)   Wt 164 lb (74.4 kg)   SpO2 98%   BMI 25.69 kg/m   Opioid Risk Score:   Fall Risk Score:  `1  Depression screen PHQ 2/9     07/13/2023    1:57 PM 11/14/2022    1:51 PM 10/26/2022    2:18 PM 10/13/2022    9:02 AM 09/28/2022    3:25 PM 04/06/2022    1:12 PM 01/05/2022    3:33 PM  Depression screen PHQ 2/9  Decreased Interest 0 0 0 0 0 1 0  Down, Depressed, Hopeless 0 0 0 0 0 0 0  PHQ - 2 Score 0 0 0 0 0 1 0  Altered sleeping      1 0  Tired, decreased energy      1 0  Change in appetite      0 0  Feeling bad or failure about yourself       0 0  Trouble concentrating      0 0  Moving slowly or fidgety/restless      0 0  Suicidal thoughts      0 0  PHQ-9 Score      3  0   Difficult doing work/chores      Extremely dIfficult Not difficult at all     Data saved with a previous flowsheet row definition      Review of Systems  Musculoskeletal:  Positive for arthralgias.       Left knee pain  All other systems reviewed and are negative.      Objective:   Physical Exam  General no acute distress Mood and affect appropriate Left knee extends to -30 degrees flexes to 90. Mild-moderate effusion left knee Tenderness to palpation diffusely around the suprapatellar bursa as well as  joint line area Ambulates without device decreased left knee stance phase. Right knee has normal range of motion no evidence of effusion     Assessment & Plan:  Assessment and Plan Assessment & Plan Left knee osteoarthritis with effusion and pain Chronic left  knee pain with effusion, exacerbated by activity. Previous MRI showed no severe damage. Recent fluid drainage and cortisone injection performed. Current pain management with duloxetine  was not tolerated. - Prescribed prescription strength anti-inflammatory medication, starting once daily, with option to increase to twice daily.  100 mg daily increase to twice a day as needed - Referred to [facility] for custom knee brace fitting. - Advised follow-up with orthopedist if swelling recurs and fluid analysis results are available. - Scheduled follow-up in three months.  Adverse effect of duloxetine  Duloxetine  discontinued due to significant gastrointestinal side effects. - Documented duloxetine  as intolerable in medical record.

## 2024-05-01 NOTE — Progress Notes (Signed)
 Office Visit Note   Patient: [name]           Date of Birth: 1970-08-11           MRN: [id number] Visit Date: 05/01/2024              Requested by: Carilyn Prentice BRAVO, MD [address] [place],  [place] [address] PCP: Elnor Lauraine BRAVO, NP   Assessment & Plan: Visit Diagnoses:  1. Chronic pain of left knee     Plan: 53 year old female with left knee osteoarthritis exacerbation with large effusion.  30 cc of joint effusion was aspirated and cortisone was injected.  Patient tolerated well.  Resume activities as tolerated in a couple days.  Follow-up if symptoms do not improve.  Follow-Up Instructions: No follow-ups on file.   Orders:  Orders Placed This Encounter  Procedures   Synovial Fluid Analysis, Complete   No orders of the defined types were placed in this encounter.     Procedures: Large Joint Inj: L knee on 05/01/2024 7:58 PM Details: 22 G needle Medications: 2 mL bupivacaine 0.5 %; 2 mL lidocaine 1 %; 40 mg methylPREDNISolone  acetate 40 MG/ML Outcome: tolerated well, no immediate complications Patient was prepped and draped in the usual sterile fashion.       Clinical Data: No additional findings.   Subjective: Chief Complaint  Patient presents with   Left Knee - Edema, Pain    HPI Patient is a 53 year old female with 6 to 7 months of left knee pain.  Is also had swelling.  Denies any injuries.  Has used naproxen , prednisone  Dosepak, ice, compression without much relief.  Use over-the-counter medications.  Knee brace does not help. Review of Systems  Constitutional: Negative.   HENT: Negative.    Eyes: Negative.   Respiratory: Negative.    Cardiovascular: Negative.   Endocrine: Negative.   Musculoskeletal: Negative.   Neurological: Negative.   Hematological: Negative.   Psychiatric/Behavioral: Negative.    All other systems reviewed and are negative.    Objective: Vital Signs: There were no vitals taken for this  visit.  Physical Exam Vitals and nursing note reviewed.  Constitutional:      Appearance: She is well-developed.  HENT:     Head: Atraumatic.     Nose: Nose normal.  Eyes:     Extraocular Movements: Extraocular movements intact.  Cardiovascular:     Pulses: Normal pulses.  Pulmonary:     Effort: Pulmonary effort is normal.  Abdominal:     Palpations: Abdomen is soft.  Musculoskeletal:     Cervical back: Neck supple.  Skin:    General: Skin is warm.     Capillary Refill: Capillary refill takes less than 2 seconds.  Neurological:     Mental Status: She is alert. Mental status is at baseline.  Psychiatric:        Behavior: Behavior normal.        Thought Content: Thought content normal.        Judgment: Judgment normal.     Ortho Exam Left knee shows large joint effusion with decreased range of motion.  Collaterals and cruciates are stable.  No joint line tenderness. Specialty Comments:  No specialty comments available.  Imaging: No results found.   PMFS History: Patient Active Problem List   Diagnosis Date Noted   Encounter for screening for malignant neoplasm of breast 07/13/2023   Encounter for hepatitis C screening test for low risk patient 07/13/2023  Screening for diabetes mellitus 07/13/2023   Encounter for general adult medical examination with abnormal findings 07/13/2023   Screening, lipid 07/13/2023   Hemorrhoids 04/12/2023   Pharyngitis 04/12/2023   Leg swelling 11/14/2022   Chronic pain of right knee 09/28/2022   Adhesive capsulitis of left shoulder 04/06/2022   Acute pain of left shoulder 01/05/2022   Constipation 01/05/2022   Colon cancer screening 01/05/2022   Abnormal MRI, lumbar spine (09/25/2021) 01/02/2022   Chronic hip pain (2ry area of Pain) (Left) 01/02/2022   Chronic low back pain (1ry area of Pain) (Left) w/o sciatica 01/02/2022   Chronic groin pain (3ry area of Pain) (Left) 01/02/2022   Greater trochanteric bursitis (4th area of Pain)  (Left) 01/02/2022   Chronic pain syndrome 01/01/2022   Pharmacologic therapy 01/01/2022   Disorder of skeletal system 01/01/2022   Problems influencing health status 01/01/2022   Snoring 09/30/2021   Daytime sleepiness 09/30/2021   Prediabetes 09/30/2021   Tooth abscess 09/30/2021   Encounter for screening mammogram for malignant neoplasm of breast 09/30/2021   HLD (hyperlipidemia) 09/30/2021   PTSD (post-traumatic stress disorder) 05/05/2021   Strain of lumbar region 05/05/2021   Situational anxiety 05/05/2021   Osteoarthritis of knee (Right) 11/05/2020   Patellofemoral arthritis of knee (Right) 06/21/2018   Cervical radiculopathy 06/21/2018   Essential hypertension 05/14/2018   Tobacco abuse 05/14/2018   Intractable migraine without status migrainosus 05/14/2018   Gout 05/14/2018   Past Medical History:  Diagnosis Date   Gout    Hypertension    Migraines     Family History  Problem Relation Age of Onset   Hypertension Mother    Healthy Father     Past Surgical History:  Procedure Laterality Date   ABDOMINAL HYSTERECTOMY  2008   Social History   Occupational History   Not on file  Tobacco Use   Smoking status: Every Day    Current packs/day: 0.10    Types: Cigarettes   Smokeless tobacco: Never   Tobacco comments:    pack lasts 3 weeks  Vaping Use   Vaping status: Never Used  Substance and Sexual Activity   Alcohol use: Yes    Comment: occasional   Drug use: No   Sexual activity: Not Currently    Birth control/protection: Surgical

## 2024-05-01 NOTE — Patient Instructions (Signed)
  VISIT SUMMARY: You visited us  today for chronic left knee pain that worsens with walking and standing, especially during your work at [employer]. We discussed your previous treatments and recent fluid drainage and cortisone injection.  YOUR PLAN: LEFT KNEE OSTEOARTHRITIS WITH EFFUSION AND PAIN: You have chronic left knee pain with swelling, which gets worse with activity. An MRI showed no severe damage, and you recently had fluid drained and a cortisone injection. -Start taking prescription strength anti-inflammatory medication once daily. You can increase to twice daily if needed. -You are referred to [facility] for a custom knee brace fitting. -Follow up with your orthopedist if the swelling comes back and when the fluid analysis results are available. -Schedule a follow-up appointment with us  in three months.  ADVERSE EFFECT OF DULOXETINE : You had to stop taking duloxetine  because it caused severe diarrhea. -Duloxetine  has been documented as intolerable in your medical record.                      Contains text generated by Abridge.                                 Contains text generated by Abridge.

## 2024-05-02 ENCOUNTER — Other Ambulatory Visit: Payer: Self-pay | Admitting: Nurse Practitioner

## 2024-05-02 ENCOUNTER — Ambulatory Visit: Payer: Self-pay | Admitting: Orthopaedic Surgery

## 2024-05-02 DIAGNOSIS — I10 Essential (primary) hypertension: Secondary | ICD-10-CM

## 2024-05-02 LAB — SYNOVIAL FLUID ANALYSIS, COMPLETE
Basophils, %: 0 %
Eosinophils-Synovial: 0 % (ref 0–2)
Lymphocytes-Synovial Fld: 64 % (ref 0–74)
Monocyte/Macrophage: 6 % (ref 0–69)
Neutrophil, Synovial: 27 % — ABNORMAL HIGH (ref 0–24)
Synoviocytes, %: 3 % (ref 0–15)
WBC, Synovial: 861 {cells}/uL — ABNORMAL HIGH (ref <150)

## 2024-05-26 ENCOUNTER — Other Ambulatory Visit: Payer: Self-pay

## 2024-05-26 ENCOUNTER — Telehealth: Payer: Self-pay | Admitting: Orthopaedic Surgery

## 2024-05-26 ENCOUNTER — Ambulatory Visit (HOSPITAL_BASED_OUTPATIENT_CLINIC_OR_DEPARTMENT_OTHER): Admitting: Student

## 2024-05-26 DIAGNOSIS — G8929 Other chronic pain: Secondary | ICD-10-CM

## 2024-05-26 NOTE — Telephone Encounter (Signed)
 Please order MRI knee.  Thanks.

## 2024-05-26 NOTE — Telephone Encounter (Signed)
 She's welcome to come in as a work in if someone has availability

## 2024-05-26 NOTE — Telephone Encounter (Signed)
 Pt called saying that that the swelling on her kneee has come back and so has the pain and that she would like to see if she can get an earlier apt. Call back number is [phone].

## 2024-05-26 NOTE — Telephone Encounter (Signed)
 Replied to other message from patient via MyChart.

## 2024-05-27 ENCOUNTER — Ambulatory Visit (INDEPENDENT_AMBULATORY_CARE_PROVIDER_SITE_OTHER): Admitting: Physician Assistant

## 2024-05-27 ENCOUNTER — Encounter: Payer: Self-pay | Admitting: Physician Assistant

## 2024-05-27 DIAGNOSIS — M25562 Pain in left knee: Secondary | ICD-10-CM | POA: Diagnosis not present

## 2024-05-27 MED ORDER — HYDROCODONE-ACETAMINOPHEN 5-325 MG PO TABS: 1.0000 | ORAL_TABLET | Freq: Four times a day (QID) | ORAL | 0 refills | Status: AC | PRN

## 2024-05-27 NOTE — Progress Notes (Signed)
 "  Office Visit Note  u Patient: [name]           Date of Birth: 11/05/1970           MRN: [id number] Visit Date: 05/27/2024              Requested by: Elnor Lauraine BRAVO, NP [address] [place],  [place] [address] PCP: Elnor Lauraine BRAVO, NP   Assessment & Plan: Visit Diagnoses:  1. Acute pain of left knee     Plan: Patient is a pleasant 53 year old woman who is a patient of Dr. Jerri.  She presents today with an exacerbation and return of left knee pain.  She was seen earlier this month by Dr.xu and diagnosed with arthritis in her left knee.  She did aspirate fluid and because of her pain and fluid he did order an MRI she is scheduled to have this on January 9.  She has had no injury but she says the pain has returned injection and aspiration was helpful for about 1 week.  She does not sign show any signs of septic joint today she has had no fever or chills she is more tender laterally she has not small effusion but not enough to aspirate at this time.  She will continue to take her Celebrex .  I have also given her a small amount of hydrocodone  to take until she sees Dr.Xu.  I will take her off work until she is seen in the office  Follow-Up Instructions: 06/12/2026  Orders:  No orders of the defined types were placed in this encounter.  Meds ordered this encounter  Medications   HYDROcodone -acetaminophen  (NORCO/VICODIN) 5-325 MG tablet    Sig: Take 1 tablet by mouth every 6 (six) hours as needed for moderate pain (pain score 4-6).    Dispense:  30 tablet    Refill:  0      Procedures: No procedures performed   Clinical Data: No additional findings.   Subjective: Chief Complaint  Patient presents with   Left Knee - Pain    Was told arthritis but feels it may be more than that, pain is intense and is triggered more with walking and pressure     HPI patient is a 53 year old woman who comes in today with return of left knee pain.  She was seen by Dr. Jerri who aspirated  her knee earlier this month.  An MRI was ordered based on this.  She does have an MRI scheduled for next week and a appointment with Dr. Jerri the following week.  She is to have the steroid shot helped her for about a week and then her pain has returned to the point she finds it difficult to be on her feet for long periods of time at work.  She denies any fever chills or malaise.  She denies any groin pain though she feels the knee pain sometimes goes up into the lateral aspect of her thigh  Review of Systems  All other systems reviewed and are negative.    Objective: Vital Signs: There were no vitals taken for this visit.  Physical Exam Constitutional:      Appearance: Normal appearance.  Pulmonary:     Effort: Pulmonary effort is normal.  Neurological:     General: No focal deficit present.     Mental Status: She is alert and oriented to person, place, and time.  Psychiatric:        Mood and Affect: Mood  normal.        Behavior: Behavior normal.     Ortho Exam Examination of her left knee she has very small effusion no erythema no warmth.  Compartments are soft and compressible.  She is tender to palpation especially over the lateral joint line.  She has difficulty secondary to pain with terminal flexion and extension and has some grinding with range of motion.  No evidence of an infective process Specialty Comments:  No specialty comments available.  Imaging: No results found.   PMFS History: Patient Active Problem List   Diagnosis Date Noted   Encounter for screening for malignant neoplasm of breast 07/13/2023   Encounter for hepatitis C screening test for low risk patient 07/13/2023   Screening for diabetes mellitus 07/13/2023   Encounter for general adult medical examination with abnormal findings 07/13/2023   Screening, lipid 07/13/2023   Hemorrhoids 04/12/2023   Pharyngitis 04/12/2023   Leg swelling 11/14/2022   Pain in left knee 09/28/2022   Adhesive capsulitis of  left shoulder 04/06/2022   Acute pain of left shoulder 01/05/2022   Constipation 01/05/2022   Colon cancer screening 01/05/2022   Abnormal MRI, lumbar spine (09/25/2021) 01/02/2022   Chronic hip pain (2ry area of Pain) (Left) 01/02/2022   Chronic low back pain (1ry area of Pain) (Left) w/o sciatica 01/02/2022   Chronic groin pain (3ry area of Pain) (Left) 01/02/2022   Greater trochanteric bursitis (4th area of Pain) (Left) 01/02/2022   Chronic pain syndrome 01/01/2022   Pharmacologic therapy 01/01/2022   Disorder of skeletal system 01/01/2022   Problems influencing health status 01/01/2022   Snoring 09/30/2021   Daytime sleepiness 09/30/2021   Prediabetes 09/30/2021   Tooth abscess 09/30/2021   Encounter for screening mammogram for malignant neoplasm of breast 09/30/2021   HLD (hyperlipidemia) 09/30/2021   PTSD (post-traumatic stress disorder) 05/05/2021   Strain of lumbar region 05/05/2021   Situational anxiety 05/05/2021   Osteoarthritis of knee (Right) 11/05/2020   Patellofemoral arthritis of knee (Right) 06/21/2018   Cervical radiculopathy 06/21/2018   Essential hypertension 05/14/2018   Tobacco abuse 05/14/2018   Intractable migraine without status migrainosus 05/14/2018   Gout 05/14/2018   Past Medical History:  Diagnosis Date   Gout    Hypertension    Migraines     Family History  Problem Relation Age of Onset   Hypertension Mother    Healthy Father     Past Surgical History:  Procedure Laterality Date   ABDOMINAL HYSTERECTOMY  2008   Social History   Occupational History   Not on file  Tobacco Use   Smoking status: Every Day    Current packs/day: 0.10    Types: Cigarettes   Smokeless tobacco: Never   Tobacco comments:    pack lasts 3 weeks  Vaping Use   Vaping status: Never Used  Substance and Sexual Activity   Alcohol use: Yes    Comment: occasional   Drug use: No   Sexual activity: Not Currently    Birth control/protection: Surgical         "

## 2024-06-03 ENCOUNTER — Encounter: Payer: Self-pay | Admitting: Orthopaedic Surgery

## 2024-06-06 ENCOUNTER — Ambulatory Visit
Admission: RE | Admit: 2024-06-06 | Discharge: 2024-06-06 | Disposition: A | Source: Ambulatory Visit | Attending: Orthopaedic Surgery | Admitting: Orthopaedic Surgery

## 2024-06-06 ENCOUNTER — Encounter: Payer: Self-pay | Admitting: Nurse Practitioner

## 2024-06-06 DIAGNOSIS — G8929 Other chronic pain: Secondary | ICD-10-CM

## 2024-06-12 ENCOUNTER — Encounter: Payer: Self-pay | Admitting: Nurse Practitioner

## 2024-06-12 ENCOUNTER — Other Ambulatory Visit: Payer: Self-pay | Admitting: Nurse Practitioner

## 2024-06-12 ENCOUNTER — Ambulatory Visit: Admitting: Orthopaedic Surgery

## 2024-06-12 DIAGNOSIS — R4589 Other symptoms and signs involving emotional state: Secondary | ICD-10-CM | POA: Insufficient documentation

## 2024-06-12 DIAGNOSIS — G8929 Other chronic pain: Secondary | ICD-10-CM

## 2024-06-12 DIAGNOSIS — M25562 Pain in left knee: Secondary | ICD-10-CM | POA: Diagnosis not present

## 2024-06-12 DIAGNOSIS — I10 Essential (primary) hypertension: Secondary | ICD-10-CM

## 2024-06-12 MED ORDER — AMLODIPINE BESYLATE 10 MG PO TABS: 10.0000 mg | ORAL_TABLET | Freq: Every day | ORAL | 3 refills | Status: AC

## 2024-06-12 NOTE — Progress Notes (Signed)
 "  Office Visit Note   Patient: [name]           Date of Birth: Dec 21, 1970           MRN: [id number] Visit Date: 06/12/2024              Requested by: Katherine Lauraine BRAVO, NP [address] [place],  [place] [address] PCP: Katherine Lauraine BRAVO, NP   Assessment & Plan: Visit Diagnoses:  1. Chronic pain of left knee     Plan: History of Present Illness [name] is a 54 year old female with left knee pain who presents for MRI review.  She reports ongoing left knee pain and inflammation, worsened by prolonged ambulation at work. After her shifts she has severe difficulty walking.  She received a left knee steroid injection on December 4th, which relieved pain for about one week before symptoms recurred. She is taking Celebrex  100 mg once daily for joint pain and inflammation along with her morning medications.  She is not monitoring her blood pressure at home and does not have a blood pressure cuff.  Physical Exam Left knee exam is unchanged from prior visit.  Trace joint effusion.  Results Radiology Left knee MRI: Articular cartilage intact, ligaments intact, medial and lateral menisci intact, synovitis present, no structural abnormalities (Independently interpreted)  Assessment and Plan Left knee pain Chronic left knee pain with MRI showing inflammation but no structural abnormalities. Suspected early osteoarthritis with possible inflammatory arthropathy. Prior corticosteroid injection provided transient relief, indicating inflammation. NSAID dosage increase may help but could affect blood pressure. - Ordered arthritis panel. - Increased celecoxib  to 100 mg twice daily. - Discussed monitoring for hypertensive effects from NSAIDs. - Cleared to return to work as tolerated.  Follow-Up Instructions: No follow-ups on file.   Orders:  Orders Placed This Encounter  Procedures   Uric acid   Sed Rate (ESR)   Antinuclear Antib (ANA)   Rheumatoid Factor   No orders of the  defined types were placed in this encounter.     Procedures: No procedures performed   Clinical Data: No additional findings.   Subjective: Chief Complaint  Patient presents with   Left Knee - Pain    HPI  Review of Systems  Constitutional: Negative.   HENT: Negative.    Eyes: Negative.   Respiratory: Negative.    Cardiovascular: Negative.   Endocrine: Negative.   Musculoskeletal: Negative.   Neurological: Negative.   Hematological: Negative.   Psychiatric/Behavioral: Negative.    All other systems reviewed and are negative.    Objective: Vital Signs: There were no vitals taken for this visit.  Physical Exam Vitals and nursing note reviewed.  Constitutional:      Appearance: She is well-developed.  HENT:     Head: Atraumatic.     Nose: Nose normal.  Eyes:     Extraocular Movements: Extraocular movements intact.  Cardiovascular:     Pulses: Normal pulses.  Pulmonary:     Effort: Pulmonary effort is normal.  Abdominal:     Palpations: Abdomen is soft.  Musculoskeletal:     Cervical back: Neck supple.  Skin:    General: Skin is warm.     Capillary Refill: Capillary refill takes less than 2 seconds.  Neurological:     Mental Status: She is alert. Mental status is at baseline.  Psychiatric:        Behavior: Behavior normal.        Thought Content: Thought content normal.  Judgment: Judgment normal.     Ortho Exam  Specialty Comments:  No specialty comments available.  Imaging: No results found.   PMFS History: Patient Active Problem List   Diagnosis Date Noted   Depressed mood 06/12/2024   Encounter for screening for malignant neoplasm of breast 07/13/2023   Encounter for hepatitis C screening test for low risk patient 07/13/2023   Screening for diabetes mellitus 07/13/2023   Encounter for general adult medical examination with abnormal findings 07/13/2023   Screening, lipid 07/13/2023   Hemorrhoids 04/12/2023   Pharyngitis  04/12/2023   Leg swelling 11/14/2022   Pain in left knee 09/28/2022   Adhesive capsulitis of left shoulder 04/06/2022   Acute pain of left shoulder 01/05/2022   Constipation 01/05/2022   Colon cancer screening 01/05/2022   Abnormal MRI, lumbar spine (09/25/2021) 01/02/2022   Chronic hip pain (2ry area of Pain) (Left) 01/02/2022   Chronic low back pain (1ry area of Pain) (Left) w/o sciatica 01/02/2022   Chronic groin pain (3ry area of Pain) (Left) 01/02/2022   Greater trochanteric bursitis (4th area of Pain) (Left) 01/02/2022   Chronic pain syndrome 01/01/2022   Pharmacologic therapy 01/01/2022   Disorder of skeletal system 01/01/2022   Problems influencing health status 01/01/2022   Snoring 09/30/2021   Daytime sleepiness 09/30/2021   Prediabetes 09/30/2021   Tooth abscess 09/30/2021   Encounter for screening mammogram for malignant neoplasm of breast 09/30/2021   HLD (hyperlipidemia) 09/30/2021   PTSD (post-traumatic stress disorder) 05/05/2021   Strain of lumbar region 05/05/2021   Situational anxiety 05/05/2021   Osteoarthritis of knee (Right) 11/05/2020   Patellofemoral arthritis of knee (Right) 06/21/2018   Cervical radiculopathy 06/21/2018   Essential hypertension 05/14/2018   Tobacco abuse 05/14/2018   Intractable migraine without status migrainosus 05/14/2018   Gout 05/14/2018   Past Medical History:  Diagnosis Date   Gout    Hypertension    Migraines     Family History  Problem Relation Age of Onset   Hypertension Mother    Healthy Father     Past Surgical History:  Procedure Laterality Date   ABDOMINAL HYSTERECTOMY  2008   Social History   Occupational History   Not on file  Tobacco Use   Smoking status: Every Day    Current packs/day: 0.10    Types: Cigarettes   Smokeless tobacco: Never   Tobacco comments:    pack lasts 3 weeks  Vaping Use   Vaping status: Never Used  Substance and Sexual Activity   Alcohol use: Yes    Comment: occasional    Drug use: No   Sexual activity: Not Currently    Birth control/protection: Surgical        "

## 2024-06-15 LAB — RHEUMATOID FACTOR: Rheumatoid fact SerPl-aCnc: <10 [IU]/mL

## 2024-06-15 LAB — ANA: Anti Nuclear Antibody (ANA): NEGATIVE

## 2024-06-15 LAB — SEDIMENTATION RATE: Sed Rate: 22 mm/h (ref 0–30)

## 2024-06-15 LAB — URIC ACID: Uric Acid, Serum: 4.4 mg/dL (ref 2.5–7.0)

## 2024-06-17 ENCOUNTER — Telehealth: Payer: Self-pay | Admitting: Oncology

## 2024-06-17 NOTE — Telephone Encounter (Signed)
 PT called to cancel appts; will call back to rescshedule

## 2024-06-24 ENCOUNTER — Ambulatory Visit: Payer: Self-pay | Admitting: Oncology

## 2024-06-24 ENCOUNTER — Other Ambulatory Visit: Payer: Self-pay

## 2024-06-27 ENCOUNTER — Ambulatory Visit: Payer: 59 | Admitting: Oncology

## 2024-06-27 ENCOUNTER — Other Ambulatory Visit: Payer: 59

## 2024-07-02 ENCOUNTER — Encounter: Payer: Self-pay | Admitting: Physician Assistant

## 2024-07-02 ENCOUNTER — Ambulatory Visit: Admitting: Physician Assistant

## 2024-07-02 DIAGNOSIS — M25462 Effusion, left knee: Secondary | ICD-10-CM | POA: Insufficient documentation

## 2024-07-02 MED ORDER — LIDOCAINE HCL 1 % IJ SOLN
4.0000 mL | INTRAMUSCULAR | Status: AC | PRN
  Administered 2024-07-02: 4 mL

## 2024-07-02 NOTE — Progress Notes (Signed)
 "  Office Visit Note   Patient: [name]           Date of Birth: July 19, 1970           MRN: [id number] Visit Date: 07/02/2024              Requested by: Elnor Lauraine BRAVO, NP [address] [place],  [place] [address] PCP: Elnor Lauraine BRAVO, NP  No chief complaint on file.     HPI: Patient is a pleasant 54 year old woman who comes in today with an effusion of her left knee.  No fever chills or injury.  She has had this in the past and had it aspirated by Dr. Jerri and injected.  She is not quite 3 months out from the steroid injection she is only requesting an aspiration today.  Assessment & Plan: Visit Diagnoses:  1. Effusion, left knee     Plan: 28 Xucc of blood-tinged synovial fluid was aspirated today.  Patient was placed as an Ace wrap.  Was advised to make a follow-up appointment with Dr.   Follow-Up Instructions: No follow-ups on file.   Ortho Exam  Patient is alert, oriented, no adenopathy, well-dressed, normal affect, normal respiratory effort. Examination of her left knee she has a moderate effusion no warmth no erythema no cellulitis compartments are soft and compressible negative Homans' sign.    Imaging: No results found. No images are attached to the encounter.  Labs: Lab Results  Component Value Date   HGBA1C 5.8 07/13/2023   HGBA1C 5.8 07/01/2021   ESRSEDRATE 22 06/12/2024   ESRSEDRATE 20 10/26/2022   ESRSEDRATE 31 01/02/2022   CRP 1 01/02/2022   LABURIC 4.4 06/12/2024   LABURIC 3.9 03/12/2020   LABURIC 4.1 06/14/2018   REPTSTATUS 11/20/2019 FINAL 11/17/2019   CULT (A) 11/17/2019    10,000 COLONIES/mL ESCHERICHIA COLI 50,000 COLONIES/mL VIRIDANS STREPTOCOCCUS Standardized susceptibility testing for this organism is not available. Performed at [facility], [address] [place], [place] [address]    LABORGA ESCHERICHIA COLI (A) 11/17/2019     Lab Results  Component Value Date   ALBUMIN 4.6 07/13/2023   ALBUMIN 4.5 09/28/2022    ALBUMIN 4.5 01/02/2022    Lab Results  Component Value Date   MG 2.2 01/02/2022   No results found for: VD25OH  No results found for: PREALBUMIN    Latest Ref Rng & Units 06/29/2023    9:05 AM 10/26/2022    1:07 PM 09/28/2022    3:58 PM  CBC EXTENDED  WBC 4.0 - 10.5 K/uL 7.9  9.8  10.5   RBC 3.87 - 5.11 MIL/uL 4.67  4.41  4.63   Hemoglobin 12.0 - 15.0 g/dL 86.8  87.5  86.6   HCT 36.0 - 46.0 % 40.3  38.5  40.5   Platelets 150 - 400 K/uL 432  449  431.0   NEUT# 1.7 - 7.7 K/uL 4.0  5.4    Lymph# 0.7 - 4.0 K/uL 3.1  3.7       There is no height or weight on file to calculate BMI.  Orders:  No orders of the defined types were placed in this encounter.  No orders of the defined types were placed in this encounter.    Procedures: Large Joint Inj: L knee on 07/02/2024 4:32 PM Indications: pain and diagnostic evaluation Details: 25 G 1.5 in needle, superolateral approach  Arthrogram: No  Medications: 4 mL lidocaine  1 % Outcome: tolerated well, no immediate complications  After verbal consent was obtained superior lateral pouch was prepped with alcohol and Betadine.  4 cc of lidocaine  plain was injected.  After adequate analgesia the area was reprepped and an 18-gauge needle on a 30 cc syringe was injected.  28 cc of blood-tinged synovial fluid was aspirated.  Band-Aid applied patient placed in Ace wrap Procedure, treatment alternatives, risks and benefits explained, specific risks discussed. Consent was given by the patient.     Clinical Data: No additional findings.  ROS:  All other systems negative, except as noted in the HPI. Review of Systems  Objective: Vital Signs: There were no vitals taken for this visit.  Specialty Comments:  No specialty comments available.  PMFS History: Patient Active Problem List   Diagnosis Date Noted   Effusion, left knee 07/02/2024   Depressed mood 06/12/2024   Encounter for screening for malignant neoplasm of breast  07/13/2023   Encounter for hepatitis C screening test for low risk patient 07/13/2023   Screening for diabetes mellitus 07/13/2023   Encounter for general adult medical examination with abnormal findings 07/13/2023   Screening, lipid 07/13/2023   Hemorrhoids 04/12/2023   Pharyngitis 04/12/2023   Leg swelling 11/14/2022   Pain in left knee 09/28/2022   Adhesive capsulitis of left shoulder 04/06/2022   Acute pain of left shoulder 01/05/2022   Constipation 01/05/2022   Colon cancer screening 01/05/2022   Abnormal MRI, lumbar spine (09/25/2021) 01/02/2022   Chronic hip pain (2ry area of Pain) (Left) 01/02/2022   Chronic low back pain (1ry area of Pain) (Left) w/o sciatica 01/02/2022   Chronic groin pain (3ry area of Pain) (Left) 01/02/2022   Greater trochanteric bursitis (4th area of Pain) (Left) 01/02/2022   Chronic pain syndrome 01/01/2022   Pharmacologic therapy 01/01/2022   Disorder of skeletal system 01/01/2022   Problems influencing health status 01/01/2022   Snoring 09/30/2021   Daytime sleepiness 09/30/2021   Prediabetes 09/30/2021   Tooth abscess 09/30/2021   Encounter for screening mammogram for malignant neoplasm of breast 09/30/2021   HLD (hyperlipidemia) 09/30/2021   PTSD (post-traumatic stress disorder) 05/05/2021   Strain of lumbar region 05/05/2021   Situational anxiety 05/05/2021   Osteoarthritis of knee (Right) 11/05/2020   Patellofemoral arthritis of knee (Right) 06/21/2018   Cervical radiculopathy 06/21/2018   Essential hypertension 05/14/2018   Tobacco abuse 05/14/2018   Intractable migraine without status migrainosus 05/14/2018   Gout 05/14/2018   Past Medical History:  Diagnosis Date   Gout    Hypertension    Migraines     Family History  Problem Relation Age of Onset   Hypertension Mother    Healthy Father     Past Surgical History:  Procedure Laterality Date   ABDOMINAL HYSTERECTOMY  2008   Social  History   Occupational History   Not on file  Tobacco Use   Smoking status: Every Day    Current packs/day: 0.10    Types: Cigarettes   Smokeless tobacco: Never   Tobacco comments:    pack lasts 3 weeks  Vaping Use   Vaping status: Never Used  Substance and Sexual Activity   Alcohol use: Yes    Comment: occasional   Drug use: No   Sexual activity: Not Currently    Birth control/protection: Surgical       "

## 2024-07-31 ENCOUNTER — Encounter: Admitting: Physical Medicine & Rehabilitation
